# Patient Record
Sex: Female | Born: 1945
Health system: Southern US, Community
[De-identification: ages and names within clinical notes are randomized; demographics above are authoritative.]

## PROBLEM LIST (undated history)

## (undated) DIAGNOSIS — K219 Gastro-esophageal reflux disease without esophagitis: Secondary | ICD-10-CM

## (undated) DIAGNOSIS — Z889 Allergy status to unspecified drugs, medicaments and biological substances status: Secondary | ICD-10-CM

## (undated) DIAGNOSIS — F329 Major depressive disorder, single episode, unspecified: Secondary | ICD-10-CM

## (undated) DIAGNOSIS — K635 Polyp of colon: Secondary | ICD-10-CM

## (undated) DIAGNOSIS — M1990 Unspecified osteoarthritis, unspecified site: Secondary | ICD-10-CM

## (undated) DIAGNOSIS — R011 Cardiac murmur, unspecified: Secondary | ICD-10-CM

## (undated) DIAGNOSIS — M48061 Spinal stenosis, lumbar region without neurogenic claudication: Secondary | ICD-10-CM

## (undated) DIAGNOSIS — F32A Depression, unspecified: Secondary | ICD-10-CM

## (undated) DIAGNOSIS — E785 Hyperlipidemia, unspecified: Secondary | ICD-10-CM

## (undated) DIAGNOSIS — I10 Essential (primary) hypertension: Secondary | ICD-10-CM

## (undated) DIAGNOSIS — T7840XA Allergy, unspecified, initial encounter: Secondary | ICD-10-CM

## (undated) HISTORY — DX: Spinal stenosis, lumbar region without neurogenic claudication: M48.061

## (undated) HISTORY — PX: CHOLECYSTECTOMY: SHX55

## (undated) HISTORY — DX: Hyperlipidemia, unspecified: E78.5

## (undated) HISTORY — DX: Depression, unspecified: F32.A

## (undated) HISTORY — DX: Polyp of colon: K63.5

## (undated) HISTORY — DX: Allergy, unspecified, initial encounter: T78.40XA

## (undated) HISTORY — DX: Cardiac murmur, unspecified: R01.1

## (undated) HISTORY — PX: APPENDECTOMY: SHX54

## (undated) HISTORY — DX: Gastro-esophageal reflux disease without esophagitis: K21.9

## (undated) HISTORY — PX: COLONOSCOPY: SHX174

## (undated) HISTORY — PX: ABDOMINAL HYSTERECTOMY: SHX81

## (undated) HISTORY — PX: POLYPECTOMY: SHX149

## (undated) HISTORY — PX: BUNIONECTOMY: SHX129

---
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
## 1997-08-02 ENCOUNTER — Other Ambulatory Visit: Admission: RE | Admit: 1997-08-02 | Discharge: 1997-08-02 | Payer: Self-pay | Admitting: Obstetrics and Gynecology

## 1997-09-01 ENCOUNTER — Ambulatory Visit (HOSPITAL_COMMUNITY): Admission: RE | Admit: 1997-09-01 | Discharge: 1997-09-01 | Payer: Self-pay | Admitting: Obstetrics and Gynecology

## 1998-09-19 ENCOUNTER — Encounter: Payer: Self-pay | Admitting: Obstetrics and Gynecology

## 1998-09-19 ENCOUNTER — Other Ambulatory Visit: Admission: RE | Admit: 1998-09-19 | Discharge: 1998-09-19 | Payer: Self-pay | Admitting: Obstetrics and Gynecology

## 1998-09-19 ENCOUNTER — Ambulatory Visit (HOSPITAL_COMMUNITY): Admission: RE | Admit: 1998-09-19 | Discharge: 1998-09-19 | Payer: Self-pay | Admitting: Obstetrics and Gynecology

## 1999-02-19 ENCOUNTER — Other Ambulatory Visit: Admission: RE | Admit: 1999-02-19 | Discharge: 1999-02-19 | Payer: Self-pay | Admitting: Podiatry

## 1999-08-16 ENCOUNTER — Encounter: Admission: RE | Admit: 1999-08-16 | Discharge: 1999-08-16 | Payer: Self-pay | Admitting: Internal Medicine

## 1999-08-16 ENCOUNTER — Encounter: Payer: Self-pay | Admitting: Internal Medicine

## 1999-09-26 ENCOUNTER — Encounter: Payer: Self-pay | Admitting: Obstetrics and Gynecology

## 1999-09-26 ENCOUNTER — Ambulatory Visit (HOSPITAL_COMMUNITY): Admission: RE | Admit: 1999-09-26 | Discharge: 1999-09-26 | Payer: Self-pay | Admitting: Obstetrics and Gynecology

## 1999-09-26 ENCOUNTER — Other Ambulatory Visit: Admission: RE | Admit: 1999-09-26 | Discharge: 1999-09-26 | Payer: Self-pay | Admitting: Obstetrics and Gynecology

## 1999-11-15 ENCOUNTER — Ambulatory Visit (HOSPITAL_COMMUNITY): Admission: RE | Admit: 1999-11-15 | Discharge: 1999-11-15 | Payer: Self-pay | Admitting: *Deleted

## 2000-10-08 ENCOUNTER — Encounter: Payer: Self-pay | Admitting: Obstetrics and Gynecology

## 2000-10-08 ENCOUNTER — Ambulatory Visit (HOSPITAL_COMMUNITY): Admission: RE | Admit: 2000-10-08 | Discharge: 2000-10-08 | Payer: Self-pay | Admitting: Obstetrics and Gynecology

## 2000-11-23 ENCOUNTER — Other Ambulatory Visit: Admission: RE | Admit: 2000-11-23 | Discharge: 2000-11-23 | Payer: Self-pay | Admitting: Obstetrics and Gynecology

## 2000-12-03 ENCOUNTER — Ambulatory Visit (HOSPITAL_COMMUNITY): Admission: RE | Admit: 2000-12-03 | Discharge: 2000-12-03 | Payer: Self-pay | Admitting: Obstetrics and Gynecology

## 2000-12-03 ENCOUNTER — Encounter: Payer: Self-pay | Admitting: Obstetrics and Gynecology

## 2001-09-29 ENCOUNTER — Other Ambulatory Visit: Admission: RE | Admit: 2001-09-29 | Discharge: 2001-09-29 | Payer: Self-pay | Admitting: Obstetrics and Gynecology

## 2002-09-23 ENCOUNTER — Encounter: Payer: Self-pay | Admitting: Obstetrics and Gynecology

## 2002-09-23 ENCOUNTER — Ambulatory Visit (HOSPITAL_COMMUNITY): Admission: RE | Admit: 2002-09-23 | Discharge: 2002-09-23 | Payer: Self-pay | Admitting: Obstetrics and Gynecology

## 2002-09-28 ENCOUNTER — Other Ambulatory Visit: Admission: RE | Admit: 2002-09-28 | Discharge: 2002-09-28 | Payer: Self-pay | Admitting: Obstetrics and Gynecology

## 2002-12-08 ENCOUNTER — Ambulatory Visit (HOSPITAL_COMMUNITY): Admission: RE | Admit: 2002-12-08 | Discharge: 2002-12-08 | Payer: Self-pay | Admitting: Obstetrics and Gynecology

## 2002-12-13 ENCOUNTER — Encounter (INDEPENDENT_AMBULATORY_CARE_PROVIDER_SITE_OTHER): Payer: Self-pay | Admitting: Specialist

## 2002-12-13 ENCOUNTER — Inpatient Hospital Stay (HOSPITAL_COMMUNITY): Admission: AD | Admit: 2002-12-13 | Discharge: 2002-12-16 | Payer: Self-pay | Admitting: Obstetrics and Gynecology

## 2002-12-13 ENCOUNTER — Encounter (INDEPENDENT_AMBULATORY_CARE_PROVIDER_SITE_OTHER): Payer: Self-pay

## 2003-08-04 ENCOUNTER — Encounter (INDEPENDENT_AMBULATORY_CARE_PROVIDER_SITE_OTHER): Payer: Self-pay | Admitting: Specialist

## 2003-08-04 ENCOUNTER — Ambulatory Visit (HOSPITAL_COMMUNITY): Admission: RE | Admit: 2003-08-04 | Discharge: 2003-08-04 | Payer: Self-pay | Admitting: Gastroenterology

## 2003-10-12 ENCOUNTER — Ambulatory Visit (HOSPITAL_COMMUNITY): Admission: RE | Admit: 2003-10-12 | Discharge: 2003-10-12 | Payer: Self-pay | Admitting: Obstetrics and Gynecology

## 2003-11-23 ENCOUNTER — Other Ambulatory Visit: Admission: RE | Admit: 2003-11-23 | Discharge: 2003-11-23 | Payer: Self-pay | Admitting: Obstetrics and Gynecology

## 2004-10-23 ENCOUNTER — Ambulatory Visit (HOSPITAL_COMMUNITY): Admission: RE | Admit: 2004-10-23 | Discharge: 2004-10-23 | Payer: Self-pay | Admitting: Obstetrics and Gynecology

## 2004-11-29 ENCOUNTER — Other Ambulatory Visit: Admission: RE | Admit: 2004-11-29 | Discharge: 2004-11-29 | Payer: Self-pay | Admitting: Obstetrics and Gynecology

## 2005-08-26 ENCOUNTER — Other Ambulatory Visit: Admission: RE | Admit: 2005-08-26 | Discharge: 2005-08-26 | Payer: Self-pay | Admitting: Obstetrics and Gynecology

## 2005-10-24 ENCOUNTER — Ambulatory Visit (HOSPITAL_COMMUNITY): Admission: RE | Admit: 2005-10-24 | Discharge: 2005-10-24 | Payer: Self-pay | Admitting: Obstetrics and Gynecology

## 2006-01-27 HISTORY — PX: OOPHORECTOMY: SHX86

## 2006-06-09 ENCOUNTER — Ambulatory Visit: Payer: Self-pay | Admitting: Nurse Practitioner

## 2007-01-18 ENCOUNTER — Ambulatory Visit: Payer: Self-pay | Admitting: Internal Medicine

## 2007-01-18 ENCOUNTER — Ambulatory Visit: Payer: Self-pay | Admitting: *Deleted

## 2007-01-19 ENCOUNTER — Ambulatory Visit (HOSPITAL_COMMUNITY): Admission: RE | Admit: 2007-01-19 | Discharge: 2007-01-19 | Payer: Self-pay | Admitting: Internal Medicine

## 2007-02-09 ENCOUNTER — Ambulatory Visit: Payer: Self-pay | Admitting: Internal Medicine

## 2007-04-07 ENCOUNTER — Ambulatory Visit: Payer: Self-pay | Admitting: Family Medicine

## 2007-04-13 ENCOUNTER — Ambulatory Visit (HOSPITAL_COMMUNITY): Admission: RE | Admit: 2007-04-13 | Discharge: 2007-04-13 | Payer: Self-pay | Admitting: Family Medicine

## 2007-11-15 ENCOUNTER — Ambulatory Visit: Payer: Self-pay | Admitting: Internal Medicine

## 2008-03-17 ENCOUNTER — Ambulatory Visit: Payer: Self-pay | Admitting: Internal Medicine

## 2008-03-17 ENCOUNTER — Encounter (INDEPENDENT_AMBULATORY_CARE_PROVIDER_SITE_OTHER): Payer: Self-pay | Admitting: Internal Medicine

## 2008-03-17 LAB — CONVERTED CEMR LAB
AST: 16 units/L (ref 0–37)
Alkaline Phosphatase: 48 units/L (ref 39–117)
BUN: 21 mg/dL (ref 6–23)
Calcium: 9.7 mg/dL (ref 8.4–10.5)
Chloride: 103 meq/L (ref 96–112)
Creatinine, Ser: 0.77 mg/dL (ref 0.40–1.20)
Total Bilirubin: 0.5 mg/dL (ref 0.3–1.2)

## 2008-04-04 ENCOUNTER — Encounter: Admission: RE | Admit: 2008-04-04 | Discharge: 2008-05-23 | Payer: Self-pay | Admitting: Internal Medicine

## 2008-04-18 ENCOUNTER — Ambulatory Visit (HOSPITAL_COMMUNITY): Admission: RE | Admit: 2008-04-18 | Discharge: 2008-04-18 | Payer: Self-pay | Admitting: Family Medicine

## 2008-07-19 ENCOUNTER — Ambulatory Visit: Payer: Self-pay | Admitting: Internal Medicine

## 2008-07-25 ENCOUNTER — Ambulatory Visit (HOSPITAL_COMMUNITY): Admission: RE | Admit: 2008-07-25 | Discharge: 2008-07-25 | Payer: Self-pay | Admitting: Internal Medicine

## 2008-10-16 ENCOUNTER — Ambulatory Visit: Payer: Self-pay | Admitting: Family Medicine

## 2008-10-25 ENCOUNTER — Ambulatory Visit (HOSPITAL_COMMUNITY): Admission: RE | Admit: 2008-10-25 | Discharge: 2008-10-25 | Payer: Self-pay | Admitting: Gastroenterology

## 2008-10-25 ENCOUNTER — Encounter (INDEPENDENT_AMBULATORY_CARE_PROVIDER_SITE_OTHER): Payer: Self-pay | Admitting: Gastroenterology

## 2009-01-02 ENCOUNTER — Ambulatory Visit: Payer: Self-pay | Admitting: Internal Medicine

## 2009-01-23 ENCOUNTER — Ambulatory Visit: Payer: Self-pay | Admitting: Internal Medicine

## 2009-04-26 ENCOUNTER — Ambulatory Visit (HOSPITAL_COMMUNITY): Admission: RE | Admit: 2009-04-26 | Discharge: 2009-04-26 | Payer: Self-pay | Admitting: Internal Medicine

## 2010-02-16 ENCOUNTER — Encounter: Payer: Self-pay | Admitting: Family Medicine

## 2010-06-14 NOTE — Op Note (Signed)
NAME:  [name], [name]                           ACCOUNT NO.:  [id number]   MEDICAL RECORD NO.:  [id number]                   PATIENT TYPE:  AMB   LOCATION:  ENDO                                 FACILITY:  [facility]   PHYSICIAN:  Graylin Shiver, M.D.                DATE OF BIRTH:  09/03/1945   DATE OF PROCEDURE:  08/04/2003  DATE OF DISCHARGE:                                 OPERATIVE REPORT   PROCEDURE PERFORMED:  Colonoscopy with biopsy.   INDICATIONS FOR PROCEDURE:  Screening.   Informed consent was obtained after explanation of the risks of bleeding,  infection, and perforation.   PREMEDICATIONS:  Fentanyl 60 mcg  IV, Versed 6 mg IV.   DESCRIPTION OF PROCEDURE:  With the patient in the left lateral decubitus  position, a rectal exam was performed and no masses were felt.  The Olympus  colonoscope was inserted into the rectum and advanced around the colon to  the cecum.  Cecal landmarks were identified.  The cecum showed a small 3 mm  polyp biopsied off with cold forceps.  The ascending colon was normal.  The  transverse colon showed a small 3 mm polyp biopsied off with a cold forceps.  The descending colon and sigmoid revealed diverticulosis.  Also in the  sigmoid was a small 3 mm polyp biopsied off with cold forceps.  The rectum  was normal.  The patient tolerated the procedure well without complications.   IMPRESSION:  1. Colon polyps, 2113.  2. Diverticulosis.   PLAN:  The pathology will be checked.                                               Graylin Shiver, M.D.    Germain Osgood  D:  08/04/2003  T:  08/04/2003  Job:  [id number]   cc:   Merlene Laughter. Renae Gloss, M.D.  [address]  [address]  [place]  [place] [address]  Fax: [phone]

## 2010-06-14 NOTE — Discharge Summary (Signed)
[name], [name]                           ACCOUNT NO.:  [id number]   MEDICAL RECORD NO.:  [id number]                   PATIENT TYPE:  INP   LOCATION:  0480                                 FACILITY:  [facility]   PHYSICIAN:  Naima A. Dillard, M.D.              DATE OF BIRTH:  Jun 21, 1945   DATE OF ADMISSION:  12/13/2002  DATE OF DISCHARGE:  12/16/2002                                 DISCHARGE SUMMARY   ADMISSION DIAGNOSES:  1. Left adnexal mass.  2. Pelvic pain.  3. Right ovarian cyst.   DISCHARGE DIAGNOSES:  1. Left adnexal mass.  2. Pelvic pain.  3. Right ovarian cyst, postoperative day #3 status post open laparoscopy,     exploratory laparotomy, pelvic washing, lysis of adhesions, left salpingo-     oophorectomy, right ovarian biopsy and cystectomy.   DISCHARGE MEDICATIONS:  Discharge medications include her blood pressure  medications which are T____ and Cardizem, also K-Dur, Phenergan, Motrin, and  Percocet.   DISCHARGE INSTRUCTIONS:  Patient is to remain on pelvic rest for 6 weeks, no  driving for 2 weeks.  She is to keep the incision clean and dry and call for  any redness, heavy vaginal bleeding, heavy bleeding from incision, exudate  from the incision.  She is also to call for any temperature greater than  100, severe abdominal pain, or unable to pass gas or have bowel movements.  She is to return to the office on December 19, 2002 at 9:45 for staple  removal.   HOSPITAL COURSE:  Patient underwent an open laparoscopy, pelvic washing,  exploratory laparotomy, lysis of adhesions, left salpingo-oophorectomy,  right ovarian biopsy and cystectomy on December 13, 2002.  Throughout the  hospital stay the patient had somewhat slow bowel recovery and did not pass  gas until late in the evening postop day #2.  Her heart remained regular.  She did have some crackles in her bases of her lungs.  Abdomen soft and  nontender.  No vaginal bleeding.  Also patient did have full bowel  recovery  and tolerated a regular diet.  She also had hypokalemia.  Her potassium was  replaced IV and p.o., her IV infiltrated so we decided to replace with p.o.  and it went from 2.9 to 3.3 and potassium is still being supplemented for 2  more weeks and she will follow up with her primary care doctor for that.  As  far as her wound it remained clean and dry and patient will come to the  office on December 19, 2002 for staple removal.  She also had a slight  temperature, T-max during her hospital stay was 100.4, it was only on one  occasion.  Her white count did decrease from 13 to 10.  She had no left  shift.  She had no signs of infection.  I believe that the temperature  elevation was secondary to atelectasis.  Important labs postoperatively BUN  and creatinine are 6 and 0.8 and potassium was 2.9, preop hemoglobin was  13.7, postop hemoglobin was 12.9 and platelets of 232, after potassium  replacement patient's potassium went to 3.3 from 2.7, her hemoglobin  remained stable at 12.3 and platelets of 237, white count actually decreased  to 10.6.  Patient was found to deem all the benefits from her hospital stay  and will be discharged home with instructions as above.                                              Naima A. Normand Sloop, M.D.   NAD/MEDQ  D:  12/16/2002  T:  12/16/2002  Job:  [id number]

## 2010-06-14 NOTE — H&P (Signed)
[name]                             ACCOUNT NO.:  [id number]   MEDICAL RECORD NO.:  [id number]                   PATIENT TYPE:   LOCATION:                                       FACILITY:   PHYSICIAN:  Naima A. Dillard, M.D.              DATE OF BIRTH:  1945/09/25   DATE OF ADMISSION:  DATE OF DISCHARGE:                                HISTORY & PHYSICAL   HISTORY OF PRESENT ILLNESS:  [name] is a 65 year old married African  American female who is status post hysterectomy and right salpingectomy, who  is para 3-0-0-3, who presents for a diagnostic laparoscopy because of pelvic  pain and a left pelvic mass.  The patient has a history of an asymptomatic  left hydrosalpinx since 1999 but developed intermittent pain in her pelvis  over the past two months.  The patient describes this pain as achy in nature  and is rated as a 5/10 on a 10-point scale without any alleviating or  exacerbating factors.  She denies any urinary tract symptoms, nausea,  vomiting, diarrhea, fever, changes in her bowel movement, or dyspareunia.  A  pelvic ultrasound on October 14, 2002 revealed a 3.5 cm by 1.49 cm left  hydrosalpinx which was an increase in size since her previous study February  2003 when it measured less than 1 cm (only one dimension was available from  that study).   A pelvic MRI on December 08, 2002 showed a 1.5 cm right ovarian cyst and on  the left a slightly serpentine cystic lesion, previously characterized as a  left hydrosalpinx measuring 1.9 cm by 2.2 cm by 2.2 cm.  There was no  complexity of the fluid signal structure within this lesion.  Additionally,  there was no enhancing component within the lesion suggesting a neoplastic  process.  Lastly, sigmoid diverticulosis was noted; however, there was no  evidence of active diverticulitis.   At the time of this dictation, CA125 results were pending.  After review of  the patient's study findings and symptoms, she was given  the options of  observation, surgery, and patient has opted to proceed with surgery.   PAST MEDICAL HISTORY:  Obstetrical history is gravida 5, para 3-0-2-3.  The  patient had no problems with her pregnancies.  GYN history:  Menarche 65  years old.  The patient's menstrual periods were more regular up until the  time of her hysterectomy.  She denies any history of abnormal Pap smears or  sexually transmitted diseases.  Her last normal Pap smear was September  2004.  Last normal mammogram was August 2004.  Medical history is positive  for hypertension and diverticulosis.   PAST SURGICAL HISTORY:  1. In 1974, cholecystectomy.  2. In 1980, total vaginal hysterectomy.  3. In 1996, laparoscopy with right salpingectomy for hydrosalpinx and lysis     of adhesions.  4. In 2001, bilateral bunionectomy.  The patient denies any history of blood     transfusions or problems with anesthesia.   FAMILY HISTORY:  Positive for heart disease, colon cancer, prostate cancer,  and hypertension.   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   HABITS:  She does not use tobacco or alcohol.   CURRENT MEDICATIONS:  1. Teveten 600/12.5 mg 1 tablet q.d.  2. Cardizem LA 360 mg 1 tablet q.d.  3. Celebrex 200 mg 1 tablet p.r.n.  4. Aleve 1 tablet q.d.   ALLERGIES:  the patient has no known drug allergies.   REVIEW OF SYMPTOMS:  The patient complains of occasional knee pain and back  pain.  Otherwise review of systems are negative except as mentioned in  history of present illness.   PHYSICAL EXAMINATION:  VITAL SIGNS:  Blood pressure 140/90.  Weight is 241.  Height is 5 feet 4 inches tall.  NECK:  Supple.  There is no adenopathy or thyromegaly.  HEART:  Regular rate and rhythm. There is no murmur.  LUNGS:  Clear to auscultation.  There are no wheezes, rales, or rhonchi.  BACK:  No CVA tenderness.  ABDOMEN:  Bowel sounds are present.  It is soft  without tenderness,  guarding, rebound, or organomegaly.  EXTREMITIES:  Without clubbing, cyanosis, or edema.  PELVIC:  EGBUS is within normal limits.  Vagina is normal.  Uterus and  cervix are surgically absent.  Adnexa with bilateral tenderness.  No  palpable masses.  Rectovaginal exam without tenderness or masses.   IMPRESSION:  1. Left pelvic mass.  2. Pelvic pain.   DISPOSITION:  The patient was given the options of undergoing surgery or  observation in relationship to her presenting symptoms and radiographic  findings.  She has chosen the former.  She understands the implications for  her procedure along with its risk which include, but are not limited to,  reaction to anesthesia, damage to adjacent organs, excessive bleeding, and  infection.  The patient was given ACOG brochure on laparoscopy.  She also  was advised to take preoperatively erythromycin 1 gm and Neomycin 1 gm 2  p.m., 4 p.m., and 10 p.m. one day prior to her surgery.  The patient was  also instructed to perform a bowel prep and given written instructions on  how to do so.  The patient has consented to undergo a diagnostic laparoscopy  with possible laparotomy and possible cancer staging at [facility] on December 13, 2002 at 2 p.m.     [name].                    Naima A. Normand Sloop, M.D.    EJP/MEDQ  D:  12/12/2002  T:  12/12/2002  Job:  [id number]

## 2010-06-14 NOTE — Op Note (Signed)
[name], [name]                           ACCOUNT NO.:  [id number]   MEDICAL RECORD NO.:  [id number]                   PATIENT TYPE:  OUT   LOCATION:  XRAY                                 FACILITY:  [facility]   PHYSICIAN:  Naima A. Dillard, M.D.              DATE OF BIRTH:  [birth year]   DATE OF PROCEDURE:  12/13/2002  DATE OF DISCHARGE:  12/08/2002                                 OPERATIVE REPORT   PREOPERATIVE DIAGNOSES:  1. Left adnexal mass.  2. Pelvic pain.  3. Right ovarian cyst.   POSTOPERATIVE DIAGNOSES:  1. Right corpus luteum cyst.  2. Left simple paratubal cyst.  3. Normal appearing left tube and ovary.  4. Pelvic adhesions.   PROCEDURE:  Open laparoscopy, pelvic washings, exploratory laparotomy, lysis  of adhesions, left salpingo-oophorectomy, right ovarian biopsy and  cystectomy.   SURGEON:  Naima A. Normand Sloop, M.D.   ASSISTANT:  Osborn Coho, M.D. and Marquis Lunch. Adline Peals.   ANESTHESIA:  General endotracheal tube.   ESTIMATED BLOOD LOSS:  300 mL   URINE OUTPUT:  900 mL clear urine.   IV FLUIDS:  4 liters crystalloid.   COMPLICATIONS:  None.   FINDINGS:  Pelvic adhesions, sigmoid vaginal cuff adhesions, sigmoid left  pelvic sidewall adhesions. There was a 3 cm paratubal cyst on the left,  there is a normal appearing left tube and ovary, the right ovarian corpus  luteal cyst and very atretic right ovary. Normal appearing appendix. The  patient went to the recovery room in stable condition.   DESCRIPTION OF PROCEDURE:  The patient was taken to the operating room where  she was placed in dorsal lithotomy position, given general anesthesia and  prepped and draped in the normal sterile fashion. A Foley catheter was  placed, 5 mL of 0.5% Marcaine with epinephrine was placed just beneath the  umbilicus. A 10 mm incision was then made with the scalpel and carried down  to the fascia. The fascia was incised in the midline and extended  bilaterally. The  peritoneum was identified, and entered sharply. A Hasson  trocar was then placed, insufflation of the abdomen was done with CO2 gas to  about 4 liters. The findings noted above were seen. A port was placed in the  left lower quadrant with a 5 mm trocar and there was some bleeding seen  around the trocar with placement which did come to a halt. Peritoneal  washings were obtained after probing noted to be dense adhesions. Dr. Kemper Durie-  Sharol Given was called in for assistance; however, he felt like he was busy and  unable to help at that time but felt that the patient needed to be open. At  this time, we did make the decision to do a laparotomy secondary to dense  adhesions and unable to get to see either ovary or the mass so the ports  were removed. The left lower abdominal  incision port was noted to be  hemostatic. The umbilical port was removed and a vertical skin incision was  then made with the scalpel along her previous incision probably from her  hysterectomy and carried down to the fascia. The fascia was then incised and  extended superiorly and inferiorly with good visualization of bowel and  bladder. The bowel was packed away with moist laparotomy sponges after the  Balfour retractor was placed. Attention was then turned to the patient's  right ovary and the right infundibulopelvic ligament was able to be  identified; however, there were severe adhesions. The sigmoid and the right  side of the colon was dissected off the right side of the pelvic wall. The  remnant of the right round ligament was identified, grasped and the  posterior aspect of the broad ligament was incised and retroperitoneally the  ureter was found. At this point, Dr. Su Hilt was called in to help secondary  to dense adhesions. I then went to the patient left side, removed more  sigmoid adhesions and the ovary and tube was found to be adherent to the  left pelvic sidewall. Again I picked up the remnant of the round  ligament on  the left and entered retroperitoneally using the broad ligament. The ureter  was then identified and at this time after adhesions were dissected, the  infundibulopelvic ligament was doubly clamped and cut, ligated with a free  tie and then suture ligated and hemostasis was assured. The paratubal cyst  was then bluntly and sharply dissected with Metzenbaum scissors and sent for  frozen pathology, this was a benign paratubal cyst. Hemostasis was assured,  the ureter was found to be fine. On the patient's right side was a very  atretic ovary there, a cystectomy was done and frozen noted to be a corpus  luteal cyst. What was left of the ovary was removed, the ureter was noted  and noted to be peristaltic, no damage was done to the ureter. The patient's  omentum was normal, appendix was normal appearing. The abdomen was then  irrigated copiously with saline, hemostasis was assured. There was no  bleeding coming from that port on the left lower abdomen. The fascia was  closed with #1 PDS in a running fashion from above and below meeting in the  middle. Hemostasis was assured before the fascia was closed we looked at the  rectus muscle and there was no bleeding or any bleeding was made hemostatic  with Bovie cautery. Before the fascia was closed on the vertical incision,  the fascia from the open laparoscopy was closed. A drain was placed into the  subcutaneous tissue after it was irrigated and the area was noted to be  hemostatic. 2-0 plain was then used to reapproximate the subcutaneous  tissue, the skin was closed with staples. The open laparoscopy was closed  with a subcuticular stitch. Sponge, lap and needle counts were correct x2.  The patient went to the recovery room in stable condition.                                               Naima A. Normand Sloop, M.D.    NAD/MEDQ  D:  12/13/2002  T:  12/13/2002  Job:  [id number]

## 2010-06-14 NOTE — H&P (Signed)
NAMEEUGENIA, [name]                           ACCOUNT NO.:  [id number]   MEDICAL RECORD NO.:  [id number]                   PATIENT TYPE:  AMB   LOCATION:  DAY                                  FACILITY:  [facility]   PHYSICIAN:  Naima A. Dillard, M.D.              DATE OF BIRTH:  1945-12-11   DATE OF ADMISSION:  DATE OF DISCHARGE:                                HISTORY & PHYSICAL   CHIEF COMPLAINT:  Left adnexal mass increasing in size.   HISTORY OF PRESENT ILLNESS:  The patient is a 65 year old African-American  female whom I met 2002.  The patient came to me with complaints of  intermittent pelvic pain and menopausal symptoms in 2002.  Her pelvic pain  has been intermittent and her total workup for the most part has been  negative, except for a left hydrosalpinx, which has been present 1999.  Because of the small risk  adnexal mass which we think is a hydrosalpinx,  which has been present since 1999 and there is a small risk of cancer the  patient chose to have observation.  The patient presented for another  ultrasound on October 14, 2002, which showed that the left hydrosalpinx  increased size measuring 3 x 1.4 x 2 cm in 2003.  The patient still was  having some intermittent pelvic pain and decided to proceed with removal of  the mass, and possible oophorectomy.  The patient also had an MRI of the  pelvis on December 08, 2002, which showed a 1.9 x 2.2 x 2.2 cystic mass  component, which is consistent with a left hydrosalpinx.  The left ovary  measured 1.3 x 3.2 x 3 cm ad the right ovary measured 1.2 x 2.9 x 2.0 cm  with a 1.5 cm cyst.  MRI was also significant for diverticulosis and a tiny  amount of pelvic free fluid, and some thickening on the vaginal mucosa at  the cuff.  The patient denies having any vaginal discharge, odor, fever, any  bleeding, dyspareunia, any urinary tract infection symptoms, or a history of  kidney stones,  she states that she has been constipated  occasionally.  She  does not have any rectal bleeding, nausea or vomiting.  She denies any  history of endometriosis or fibroids even though her hysterectomy was done  for The uterus was twisted.  She is not sure if she had fibroids at the  time.  She has been followed by previous ultrasound.  She denies having any  history of sexually transmitted disease; but, she has had a cholecystectomy  in the past.   PAST MEDICAL HISTORY:  The past medical history is significant for  hypertension.   MEDICATIONS:  The patient's medications include:  1. Cardizem 360 mg daily.  2. Aleve daily.  3. Celebrex 200 mg p.r.n.  4. Teveten 600/12.5 mg daily.   PAST SURGICAL HISTORY:  The past surgical  history is significant for:  1. Cholecystectomy in 1974.  2. In 1988 she had a hysterectomy.  3. In 1996 she had a laparoscopic right salpingectomy with lysis of     adhesions.  4. In 2001 she had a bunionectomy.   ALLERGIES:  The patient has no known drug allergies.   SOCIAL HISTORY:  Social history is negative for alcohol, cigarettes or  illicit drug use.   FAMILY HISTORY:  Family history is significant for a paternal aunt with  breast cancer and father with hypertension.   REVIEW OF SYSTEMS:  On review of systems the patient has knee and back pain;  and, the rest is as above.   PHYSICAL EXAMINATION:  VITAL SIGNS:  On physical exam she weighs 241 pounds.  Her blood pressure is 140/90.  HEENT:  The patient's pupils are equal.  Hearing is normal.  Throat is  clear.  NECK:  The patient's thyroid is not enlarged.  HEART:  Heart has regular rate and rhythm.  LUNGS:  The patient's lungs are clear to auscultation bilaterally.  BACK:  The back has no CVA tenderness.  ABDOMEN:  The patient's abdomen is nontender without any masses or  organomegaly.  BREASTS:  Breasts have no masses, discharges, skin changes or nipple  retraction.  EXTREMITIES:  There are no cyanosis, clubbing or edema.  NEUROLOGIC:   Neuro exam is within normal limits.  PELVIC EXAMINATION:  Vulvovaginal exam is within normal limits.  Her adnexa  have no masses and are nontender on palpation.  Rectovaginal exam is within  normal limits.   ASSESSMENT:  Left adnexal mass with possible growth.   The patient understands that this could be just an increase in the  hydrosalpinx, it could be the same in measurement error.  It could represent  a cancer of the ovarian or fallopian tube.   The patient was given the option of observation with another ultrasound  versus laparoscopy, possible exploratory laparotomy and possible staging.  Because of the patient's pelvic pain she opted to have the laparoscopy with  removal of the mass, possible bilateral oophorectomy and possible staging at  laparotomy.  The patient understands the risks are, but not limited to  bleeding, infection, damage to internal organs such as bowel, bladder and  major blood vessels.  The patient did have a bowel prep with antibiotics and  was clear how to take them.  She came in for a preoperative visit with  physician assistant, Marquis Lunch. Lowell Guitar, on December 13, 2002.                                               Naima A. Normand Sloop, M.D.    NAD/MEDQ  D:  12/12/2002  T:  12/13/2002  Job:  [id number]

## 2010-11-29 ENCOUNTER — Other Ambulatory Visit: Payer: Self-pay | Admitting: Family Medicine

## 2010-12-31 ENCOUNTER — Other Ambulatory Visit: Payer: Self-pay | Admitting: Family Medicine

## 2010-12-31 DIAGNOSIS — Z1231 Encounter for screening mammogram for malignant neoplasm of breast: Secondary | ICD-10-CM

## 2011-02-05 ENCOUNTER — Ambulatory Visit (HOSPITAL_COMMUNITY)
Admission: RE | Admit: 2011-02-05 | Discharge: 2011-02-05 | Disposition: A | Discharge status: Home / Self Care | Payer: Self-pay | Source: Ambulatory Visit | Attending: Family Medicine | Admitting: Family Medicine

## 2011-02-05 DIAGNOSIS — Z1231 Encounter for screening mammogram for malignant neoplasm of breast: Secondary | ICD-10-CM

## 2011-10-23 ENCOUNTER — Emergency Department (INDEPENDENT_AMBULATORY_CARE_PROVIDER_SITE_OTHER): Payer: Self-pay

## 2011-10-23 ENCOUNTER — Emergency Department (INDEPENDENT_AMBULATORY_CARE_PROVIDER_SITE_OTHER)
Admission: EM | Admit: 2011-10-23 | Discharge: 2011-10-23 | Disposition: A | Discharge status: Home / Self Care | Payer: Self-pay | Source: Home / Self Care | Attending: Emergency Medicine | Admitting: Emergency Medicine

## 2011-10-23 ENCOUNTER — Encounter (HOSPITAL_COMMUNITY): Payer: Self-pay

## 2011-10-23 DIAGNOSIS — M25461 Effusion, right knee: Secondary | ICD-10-CM

## 2011-10-23 DIAGNOSIS — M25469 Effusion, unspecified knee: Secondary | ICD-10-CM

## 2011-10-23 DIAGNOSIS — M1710 Unilateral primary osteoarthritis, unspecified knee: Secondary | ICD-10-CM

## 2011-10-23 DIAGNOSIS — M179 Osteoarthritis of knee, unspecified: Secondary | ICD-10-CM

## 2011-10-23 DIAGNOSIS — IMO0002 Reserved for concepts with insufficient information to code with codable children: Secondary | ICD-10-CM

## 2011-10-23 HISTORY — DX: Essential (primary) hypertension: I10

## 2011-10-23 MED ORDER — MELOXICAM 7.5 MG PO TABS: 7.5000 mg | ORAL_TABLET | Freq: Every day | ORAL | Status: AC

## 2011-10-23 MED ORDER — HYDROCODONE-ACETAMINOPHEN 5-500 MG PO TABS: 1.0000 | ORAL_TABLET | Freq: Four times a day (QID) | ORAL | Status: DC | PRN

## 2011-10-23 NOTE — ED Provider Notes (Signed)
History     CSN: [id number]  Arrival date & time 10/23/11  1414   First MD Initiated Contact with Patient 10/23/11 1434      Chief Complaint  Patient presents with  . Knee Pain    (Consider location/radiation/quality/duration/timing/severity/associated sxs/prior treatment) HPI Comments: Patient presents urgent care complaining of right knee pain (global), denies any recent injury falls or traumas. She describes she has known arthritis on her left knee but at this point that seem to be doing better lately. This pain started about a week ago and occasionally feels like it hurts on top of her knee sometimes towards the inner aspect of it. Hurts more in the mornings and as the day progresses tends to get a bit better. Denies any increase swelling, redness or warmth. Patient also denies any weakness or tingling or numbness sensation to her right lower extremity. Movement and activity and walking exacerbates her pain and she's been use of a local cream was recommended to her that seem to have helped in the last few days.  Patient is a 66 y.o. female presenting with knee pain. The history is provided by the patient.  Knee Pain This is a new problem. The problem occurs constantly. The problem has been gradually worsening. Pertinent negatives include no chest pain. The symptoms are aggravated by walking and twisting. Nothing relieves the symptoms. The treatment provided no relief.    Past Medical History  Diagnosis Date  . Hypertension     History reviewed. No pertinent past surgical history.  No family history on file.  History  Substance Use Topics  . Smoking status: Never Smoker   . Smokeless tobacco: Not on file  . Alcohol Use: No    OB History    Grav Para Term Preterm Abortions TAB SAB Ect Mult Living                  Review of Systems  Constitutional: Positive for activity change. Negative for fever, chills, diaphoresis, appetite change and fatigue.  Cardiovascular:  Negative for chest pain and leg swelling.  Musculoskeletal: Positive for joint swelling. Negative for myalgias and arthralgias.  Skin: Negative for color change, rash and wound.  Neurological: Negative for tremors, weakness and numbness.    Allergies  Review of patient's allergies indicates no known allergies.  Home Medications   Current Outpatient Rx  Name Route Sig Dispense Refill  . DILTIAZEM HCL 120 MG PO TABS Oral Take 120 mg by mouth 3 (three) times daily.    . GUAIFENESIN ER 600 MG PO TB12 Oral Take 1,200 mg by mouth 2 (two) times daily.    Marland Kitchen HYDROCHLOROTHIAZIDE 25 MG PO TABS Oral Take 25 mg by mouth daily.    Marland Kitchen LISINOPRIL 20 MG PO TABS Oral Take 20 mg by mouth 2 (two) times daily.    Marland Kitchen NAPROXEN 500 MG PO TABS Oral Take 500 mg by mouth as needed.    Marland Kitchen VITAMIN B-12 1000 MCG PO TABS Oral Take 1,000 mcg by mouth daily.      BP 170/100  Pulse 74  Temp 98.4 F (36.9 C) (Oral)  Resp 16  SpO2 96%  Physical Exam  Nursing note and vitals reviewed. Constitutional: No distress.  Musculoskeletal: She exhibits tenderness. She exhibits no edema.       Legs: Neurological: She is alert.  Skin: Skin is warm. No rash noted. No erythema.    ED Course  Procedures (including critical care time)  Labs Reviewed - No data  to display No results found.   No diagnosis found.    MDM          Jimmie Molly, MD 10/23/11 [phone]

## 2011-10-23 NOTE — ED Notes (Signed)
Patient complains of right knee pain x 1 week, no injury

## 2012-01-30 ENCOUNTER — Other Ambulatory Visit: Payer: Self-pay | Admitting: Family Medicine

## 2012-01-30 DIAGNOSIS — Z1231 Encounter for screening mammogram for malignant neoplasm of breast: Secondary | ICD-10-CM

## 2012-02-11 ENCOUNTER — Ambulatory Visit (HOSPITAL_COMMUNITY)
Admission: RE | Admit: 2012-02-11 | Discharge: 2012-02-11 | Disposition: A | Discharge status: Home / Self Care | Payer: Self-pay | Source: Ambulatory Visit | Attending: Family Medicine | Admitting: Family Medicine

## 2012-02-11 DIAGNOSIS — Z1231 Encounter for screening mammogram for malignant neoplasm of breast: Secondary | ICD-10-CM

## 2012-04-08 ENCOUNTER — Encounter (HOSPITAL_COMMUNITY): Payer: Self-pay

## 2012-04-08 ENCOUNTER — Emergency Department (INDEPENDENT_AMBULATORY_CARE_PROVIDER_SITE_OTHER)
Admission: EM | Admit: 2012-04-08 | Discharge: 2012-04-08 | Disposition: A | Discharge status: Home / Self Care | Payer: Self-pay | Source: Home / Self Care | Attending: Emergency Medicine | Admitting: Emergency Medicine

## 2012-04-08 DIAGNOSIS — B029 Zoster without complications: Secondary | ICD-10-CM

## 2012-04-08 HISTORY — DX: Unspecified osteoarthritis, unspecified site: M19.90

## 2012-04-08 MED ORDER — ACYCLOVIR 400 MG PO TABS: 800.0000 mg | ORAL_TABLET | ORAL | Status: DC

## 2012-04-08 MED ORDER — BACITRACIN ZINC 500 UNIT/GM EX OINT: TOPICAL_OINTMENT | Freq: Two times a day (BID) | CUTANEOUS | Status: DC

## 2012-04-08 MED ORDER — CEPHALEXIN 500 MG PO CAPS: 500.0000 mg | ORAL_CAPSULE | Freq: Three times a day (TID) | ORAL | Status: DC

## 2012-04-08 NOTE — ED Provider Notes (Signed)
Chief Complaint:   Chief Complaint  Patient presents with  . Rash    History of Present Illness:   [name] is a 67 year old female who has had a one-week history of a rash in her right axilla. This is itchy and sore. It started out as a few small pustules. It has not spread any since it first began. She denies any fever or chills. She has no rash anywhere else. No difficulty breathing or swelling of lips, tongue, or throat.  Review of Systems:  Other than noted above, the patient denies any of the following symptoms: Systemic:  No fever, chills, sweats, weight loss, or fatigue. ENT:  No nasal congestion, rhinorrhea, sore throat, swelling of lips, tongue or throat. Resp:  No cough, wheezing, or shortness of breath. Skin:  No rash, itching, nodules, or suspicious lesions.  PMFSH:  Past medical history, family history, social history, meds, and allergies were reviewed. She has no medication allergies, she takes hydrochlorothiazide, lisinopril, and diltiazem for high blood pressure, and Naprosyn for arthritis. There no new medications.  Physical Exam:   Vital signs:  BP 155/93  Pulse 86  Temp(Src) 98.3 F (36.8 C) (Oral)  Resp 16  SpO2 96% Gen:  Alert, oriented, in no distress. ENT:  Pharynx clear, no intraoral lesions, moist mucous membranes. Lungs:  Clear to auscultation. Skin:  She has 2 clusters of dried up and scabbed over pustules or vesicles with surrounding erythema in the right axilla. Skin was otherwise clear.  Assessment:  The encounter diagnosis was Shingles.  Her most likely diagnosis is shingles, but this could be a folliculitis or shingles with secondary infection. I'll treat with acyclovir but also add cephalexin and bacitracin ointment to apply topically as well.  Plan:   1.  The following meds were prescribed:   Discharge Medication List as of 04/08/2012  4:21 PM    START taking these medications   Details  acyclovir (ZOVIRAX) 400 MG tablet Take 2 tablets (800  mg total) by mouth every 4 (four) hours while awake., Starting 04/08/2012, Until Discontinued, Normal    bacitracin ointment Apply topically 2 (two) times daily., Starting 04/08/2012, Until Discontinued, Normal    cephALEXin (KEFLEX) 500 MG capsule Take 1 capsule (500 mg total) by mouth 3 (three) times daily., Starting 04/08/2012, Until Discontinued, Normal       2.  The patient was instructed in symptomatic care and handouts were given. 3.  The patient was told to return if becoming worse in any way, if no better in 3 or 4 days, and given some red flag symptoms such as worsening rash, fever, or severe pain that would indicate earlier return.     Reuben Likes, MD 04/08/12 [phone]

## 2012-04-08 NOTE — ED Notes (Signed)
1 week duration of rash on right posterior axilla area. eruption of vesicular type lesions noted, right back area; NAD

## 2012-06-14 ENCOUNTER — Other Ambulatory Visit (INDEPENDENT_AMBULATORY_CARE_PROVIDER_SITE_OTHER): Payer: Medicare Other

## 2012-06-14 DIAGNOSIS — Z8679 Personal history of other diseases of the circulatory system: Secondary | ICD-10-CM

## 2012-06-14 DIAGNOSIS — E785 Hyperlipidemia, unspecified: Secondary | ICD-10-CM | POA: Diagnosis not present

## 2012-06-14 DIAGNOSIS — Z0000 Encounter for general adult medical examination without abnormal findings: Secondary | ICD-10-CM

## 2012-06-14 DIAGNOSIS — E559 Vitamin D deficiency, unspecified: Secondary | ICD-10-CM

## 2012-06-14 LAB — LIPID PANEL
HDL: 43 mg/dL (ref >39)
LDL Cholesterol: 140 mg/dL — ABNORMAL HIGH (ref 0–99)
Total CHOL/HDL Ratio: 4.7 Ratio
Triglycerides: 84 mg/dL (ref <150)

## 2012-06-14 LAB — BASIC METABOLIC PANEL
CO2: 33 mEq/L — ABNORMAL HIGH (ref 19–32)
Calcium: 9.4 mg/dL (ref 8.4–10.5)
Creat: 0.79 mg/dL (ref 0.50–1.10)

## 2012-06-17 ENCOUNTER — Encounter: Payer: Self-pay | Admitting: Family Medicine

## 2012-06-17 ENCOUNTER — Telehealth: Payer: Self-pay | Admitting: Family Medicine

## 2012-06-17 ENCOUNTER — Ambulatory Visit (INDEPENDENT_AMBULATORY_CARE_PROVIDER_SITE_OTHER): Payer: Medicare Other | Admitting: Family Medicine

## 2012-06-17 VITALS — BP 122/74 | HR 78 | Temp 98.1°F | Resp 18 | Ht 64.0 in | Wt 243.0 lb

## 2012-06-17 DIAGNOSIS — I10 Essential (primary) hypertension: Secondary | ICD-10-CM

## 2012-06-17 DIAGNOSIS — Z13820 Encounter for screening for osteoporosis: Secondary | ICD-10-CM | POA: Diagnosis not present

## 2012-06-17 DIAGNOSIS — Z23 Encounter for immunization: Secondary | ICD-10-CM

## 2012-06-17 DIAGNOSIS — Z0000 Encounter for general adult medical examination without abnormal findings: Secondary | ICD-10-CM | POA: Diagnosis not present

## 2012-06-17 DIAGNOSIS — M1990 Unspecified osteoarthritis, unspecified site: Secondary | ICD-10-CM | POA: Insufficient documentation

## 2012-06-17 DIAGNOSIS — K635 Polyp of colon: Secondary | ICD-10-CM | POA: Insufficient documentation

## 2012-06-17 DIAGNOSIS — E785 Hyperlipidemia, unspecified: Secondary | ICD-10-CM

## 2012-06-17 DIAGNOSIS — E2839 Other primary ovarian failure: Secondary | ICD-10-CM

## 2012-06-17 NOTE — Telephone Encounter (Signed)
Its due again in 2015

## 2012-06-17 NOTE — Progress Notes (Signed)
Subjective:    Patient ID: [name], female    DOB: 04-16-1945, 67 y.o.   MRN: [id number]  HPI  Patient is here for her CPE.  She sees GYN for pap, breast exam, and pelvic exam.  She has no major complaints.  She does have episodic low back pain and right knee pain which responds to naprosyn after a few days.  Other than that, she is doing quite well.  She is unsure if her colonoscopy is due.  Her last DEXA was 3 years ago.  Her mammogram was normal in 1/14. She had a BMP and FLP drawn prior to the appointment.  The FLP was significant for LDL 140, HDL 43, chol of 200.    She is not exercising  She is 50-60 pounds above her ideal weight. Past Medical History  Diagnosis Date  . Murmur, heart   . Hyperlipidemia   . Hypertension   . Arthritis   . Colon polyps    Current Outpatient Prescriptions on File Prior to Visit  Medication Sig Dispense Refill  . diltiazem (CARDIZEM) 120 MG tablet Take 120 mg by mouth 3 (three) times daily.      Marland Kitchen guaiFENesin (MUCINEX) 600 MG 12 hr tablet Take 1,200 mg by mouth 2 (two) times daily.      . hydrochlorothiazide (HYDRODIURIL) 25 MG tablet Take 25 mg by mouth daily.      Marland Kitchen lisinopril (PRINIVIL,ZESTRIL) 20 MG tablet Take 20 mg by mouth 2 (two) times daily.      . naproxen (NAPROSYN) 500 MG tablet Take 500 mg by mouth 2 (two) times daily with a meal.      . vitamin B-12 (CYANOCOBALAMIN) 1000 MCG tablet Take 1,000 mcg by mouth daily.       No current facility-administered medications on file prior to visit.   No Known Allergies History   Social History  . Marital Status: Married    Spouse Name: N/A    Number of Children: N/A  . Years of Education: N/A   Occupational History  . Not on file.   Social History Main Topics  . Smoking status: Never Smoker   . Smokeless tobacco: Not on file  . Alcohol Use: No  . Drug Use: No  . Sexually Active: Not on file   Other Topics Concern  . Not on file   Social History Narrative  . No narrative on  file   FH is significant for a brother with colon cancer.  Review of Systems  All other systems reviewed and are negative.       Objective:   Physical Exam  Vitals reviewed. Constitutional: She is oriented to person, place, and time. She appears well-developed and well-nourished.  HENT:  Head: Normocephalic.  Right Ear: External ear normal.  Left Ear: External ear normal.  Nose: Nose normal.  Mouth/Throat: Oropharynx is clear and moist. No oropharyngeal exudate.  Eyes: Conjunctivae are normal. Pupils are equal, round, and reactive to light. Right eye exhibits no discharge. Left eye exhibits no discharge. No scleral icterus.  Neck: Normal range of motion. Neck supple. No JVD present. No thyromegaly present.  Cardiovascular: Normal rate and regular rhythm.  Exam reveals no gallop and no friction rub.   Murmur heard. Pulmonary/Chest: Effort normal and breath sounds normal. No respiratory distress. She has no wheezes. She has no rales. She exhibits no tenderness.  Abdominal: Soft. Bowel sounds are normal. She exhibits no distension and no mass. There is no tenderness. There is  no rebound and no guarding.  Musculoskeletal: Normal range of motion. She exhibits no edema and no tenderness.  Lymphadenopathy:    She has no cervical adenopathy.  Neurological: She is alert and oriented to person, place, and time. She has normal reflexes. She displays normal reflexes. No cranial nerve deficit. She exhibits normal muscle tone. Coordination normal.  Skin: Skin is warm and dry. No rash noted. No erythema. No pallor.  Psychiatric: She has a normal mood and affect. Her behavior is normal. Judgment and thought content normal.          Assessment & Plan:  1. Routine general medical examination at a health care facility We discussed her BMP and fasting lipid panel. She will receive a Pneumovax today. She will call her in insurance regarding the single shot. She can return anytime for shingle shot  if she wants to get that. She will check to see if her colonoscopy is up to date. If we need to schedule that she will notify me. She's not sure if it's 3-5 years when it is due.  Also schedule patient for screening bone density.  2. HTN (hypertension) Blood pressure is well controlled, continue current medications.  3. HLD (hyperlipidemia) Discussed a low saturated fat diet. She will try to limit the consumption of animal meat, fast food, junk food, sweets peaches and a tiny more fresh fruits and vegetables. She doesn't try to increase her aerobic exercise. We'll recheck a fasting lipid panel in 6 months. I have asked her to try to lose 10-15 pounds.  4. Screening for osteoporosis Schedule bone density.  5. Menopause ovarian failure Schedule bone density.

## 2012-06-19 NOTE — Telephone Encounter (Signed)
Pt aware.

## 2012-07-02 ENCOUNTER — Other Ambulatory Visit: Payer: Self-pay | Admitting: Family Medicine

## 2012-07-02 NOTE — Telephone Encounter (Signed)
Medication refilled per protocol. 

## 2012-07-12 ENCOUNTER — Other Ambulatory Visit: Payer: Self-pay | Admitting: Family Medicine

## 2012-07-15 ENCOUNTER — Ambulatory Visit (HOSPITAL_COMMUNITY)
Admission: RE | Admit: 2012-07-15 | Discharge: 2012-07-15 | Disposition: A | Discharge status: Home / Self Care | Payer: Medicare Other | Source: Ambulatory Visit | Attending: Family Medicine | Admitting: Family Medicine

## 2012-07-15 DIAGNOSIS — E2839 Other primary ovarian failure: Secondary | ICD-10-CM

## 2012-07-15 DIAGNOSIS — Z780 Asymptomatic menopausal state: Secondary | ICD-10-CM | POA: Insufficient documentation

## 2012-07-15 DIAGNOSIS — Z13820 Encounter for screening for osteoporosis: Secondary | ICD-10-CM | POA: Insufficient documentation

## 2012-07-15 DIAGNOSIS — Z0000 Encounter for general adult medical examination without abnormal findings: Secondary | ICD-10-CM

## 2012-08-10 DIAGNOSIS — H9209 Otalgia, unspecified ear: Secondary | ICD-10-CM | POA: Diagnosis not present

## 2012-08-10 DIAGNOSIS — M542 Cervicalgia: Secondary | ICD-10-CM | POA: Diagnosis not present

## 2012-08-10 DIAGNOSIS — H93299 Other abnormal auditory perceptions, unspecified ear: Secondary | ICD-10-CM | POA: Diagnosis not present

## 2012-08-10 DIAGNOSIS — R490 Dysphonia: Secondary | ICD-10-CM | POA: Diagnosis not present

## 2012-10-10 ENCOUNTER — Other Ambulatory Visit: Payer: Self-pay | Admitting: Family Medicine

## 2012-10-12 ENCOUNTER — Other Ambulatory Visit: Payer: Self-pay | Admitting: Family Medicine

## 2012-12-20 ENCOUNTER — Ambulatory Visit (INDEPENDENT_AMBULATORY_CARE_PROVIDER_SITE_OTHER): Payer: Medicare Other | Admitting: Family Medicine

## 2012-12-20 ENCOUNTER — Other Ambulatory Visit: Payer: Self-pay | Admitting: Family Medicine

## 2012-12-20 ENCOUNTER — Encounter: Payer: Self-pay | Admitting: Family Medicine

## 2012-12-20 VITALS — BP 136/72 | HR 84 | Temp 97.8°F | Resp 18 | Ht 64.0 in | Wt 250.0 lb

## 2012-12-20 DIAGNOSIS — I10 Essential (primary) hypertension: Secondary | ICD-10-CM

## 2012-12-20 DIAGNOSIS — E785 Hyperlipidemia, unspecified: Secondary | ICD-10-CM

## 2012-12-20 DIAGNOSIS — Z124 Encounter for screening for malignant neoplasm of cervix: Secondary | ICD-10-CM

## 2012-12-20 DIAGNOSIS — M25569 Pain in unspecified knee: Secondary | ICD-10-CM | POA: Diagnosis not present

## 2012-12-20 NOTE — Progress Notes (Signed)
Subjective:    Patient ID: [name], female    DOB: [birth year], 67 y.o.   MRN: [id number]  HPI Patient presents today for a followup. I last outpatient and may. At that time her LDL was elevated at 130. We recommended therapeutic lifestyle changes and weight loss to try to bring her LDL less than 1:30. Unfortunately she has gained 7 pounds since that visit. She admits that she has not been following a diet. She is to recheck a fasting lipid panel. Choi her blood pressures well controlled at 136/72. She denies any chest pain, shortness of breath, dyspnea on exertion. She does complain of bilateral aching knee pain. She denies any injury. This is been a gradual onset over the last several months. It is worse with prolonged standing or walking. Past Medical History  Diagnosis Date  . Murmur, heart   . Hyperlipidemia   . Hypertension   . Arthritis   . Colon polyps    Current Outpatient Prescriptions on File Prior to Visit  Medication Sig Dispense Refill  . diltiazem (CARDIZEM) 120 MG tablet TAKE ONE TABLET BY MOUTH THREE TIMES DAILY  270 tablet  3  . hydrochlorothiazide (HYDRODIURIL) 25 MG tablet TAKE ONE TABLET BY MOUTH ONCE DAILY  90 tablet  0  . lisinopril (PRINIVIL,ZESTRIL) 20 MG tablet TAKE TWO TABLETS BY MOUTH ONCE DAILY  180 tablet  0  . naproxen (NAPROSYN) 500 MG tablet Take 500 mg by mouth 2 (two) times daily with a meal.      . Cholecalciferol (VITAMIN D-3) 1000 UNITS CAPS Take by mouth.      . vitamin B-12 (CYANOCOBALAMIN) 1000 MCG tablet Take 1,000 mcg by mouth daily.       No current facility-administered medications on file prior to visit.   Past Surgical History  Procedure Laterality Date  . Abdominal hysterectomy     No Known Allergies History   Social History  . Marital Status: Married    Spouse Name: N/A    Number of Children: N/A  . Years of Education: N/A   Occupational History  . Not on file.   Social History Main Topics  . Smoking status: Never Smoker    . Smokeless tobacco: Not on file  . Alcohol Use: No  . Drug Use: No  . Sexual Activity: Not on file   Other Topics Concern  . Not on file   Social History Narrative  . No narrative on file      Review of Systems  All other systems reviewed and are negative.       Objective:   Physical Exam  Vitals reviewed. Neck: Neck supple. No JVD present.  Cardiovascular: Normal rate and regular rhythm.  Exam reveals no gallop and no friction rub.   No murmur heard. Pulmonary/Chest: Effort normal and breath sounds normal. No respiratory distress. She has no wheezes. She has no rales. She exhibits no tenderness.  Abdominal: Soft. Bowel sounds are normal. She exhibits no distension. There is no tenderness. There is no rebound and no guarding.  Musculoskeletal:       Left knee: She exhibits decreased range of motion. She exhibits no swelling, no effusion, no LCL laxity, normal patellar mobility, normal meniscus and no MCL laxity. Tenderness found. Patellar tendon tenderness noted. No medial joint line, no lateral joint line, no MCL and no LCL tenderness noted.  Lymphadenopathy:    She has no cervical adenopathy.          Assessment & Plan:  1. HTN (hypertension) Blood pressures currently well controlled. Continue present medications at their current dosages.  2. HLD (hyperlipidemia) LDL is less than 130. Return fasting to check fasting lipid panel. I will also check a CMP. Also recommended a flu shot as well and is Prevnar 13 in one year. She declines the flu shot today. - COMPLETE METABOLIC PANEL WITH GFR; Future - Lipid panel; Future  3. Knee pain, unspecified laterality I suspect arthritis. I recommended glucosamine/chondroitin sulfate over-the-counter. I recommended weight loss. I recommended increasing Naprosyn to 500 mg by mouth twice a day as needed.

## 2013-01-03 ENCOUNTER — Other Ambulatory Visit: Payer: Self-pay | Admitting: Family Medicine

## 2013-02-01 ENCOUNTER — Other Ambulatory Visit: Payer: Self-pay | Admitting: Family Medicine

## 2013-02-08 ENCOUNTER — Other Ambulatory Visit: Payer: Self-pay | Admitting: Family Medicine

## 2013-02-08 MED ORDER — NAPROXEN 500 MG PO TABS: ORAL_TABLET | ORAL | Status: DC

## 2013-02-08 NOTE — Telephone Encounter (Signed)
Rx Refilled  

## 2013-02-18 ENCOUNTER — Telehealth: Payer: Self-pay | Admitting: Family Medicine

## 2013-02-18 MED ORDER — AMOXICILLIN 875 MG PO TABS
875.0000 mg | ORAL_TABLET | Freq: Two times a day (BID) | ORAL | Status: DC
Start: 2013-02-18 — End: 2013-06-29

## 2013-02-18 NOTE — Telephone Encounter (Signed)
Has been having a cold lately. A lot of head congestion, drainage.  Mostly on right side.  Now having problem with rt eye.  Eye swollen and and draining.  Wants you to call her in antibiotic.  No appts today.

## 2013-02-18 NOTE — Telephone Encounter (Signed)
Amoxicillin 875 bid for 10 days for sinus infection.

## 2013-02-18 NOTE — Telephone Encounter (Signed)
Rx to pharmacy and pt aware.  Will NTBS if worsens or not improving.

## 2013-02-28 ENCOUNTER — Other Ambulatory Visit (HOSPITAL_COMMUNITY): Payer: Self-pay | Admitting: Obstetrics and Gynecology

## 2013-02-28 DIAGNOSIS — Z1231 Encounter for screening mammogram for malignant neoplasm of breast: Secondary | ICD-10-CM

## 2013-03-01 ENCOUNTER — Ambulatory Visit (HOSPITAL_COMMUNITY)
Admission: RE | Admit: 2013-03-01 | Discharge: 2013-03-01 | Disposition: A | Discharge status: Home / Self Care | Payer: Medicare Other | Source: Ambulatory Visit | Attending: Obstetrics and Gynecology | Admitting: Obstetrics and Gynecology

## 2013-03-01 DIAGNOSIS — Z1231 Encounter for screening mammogram for malignant neoplasm of breast: Secondary | ICD-10-CM | POA: Diagnosis not present

## 2013-03-07 DIAGNOSIS — H02829 Cysts of unspecified eye, unspecified eyelid: Secondary | ICD-10-CM | POA: Diagnosis not present

## 2013-03-07 DIAGNOSIS — H25099 Other age-related incipient cataract, unspecified eye: Secondary | ICD-10-CM | POA: Diagnosis not present

## 2013-03-09 ENCOUNTER — Encounter: Payer: Medicare Other | Admitting: Obstetrics & Gynecology

## 2013-05-18 DIAGNOSIS — R1032 Left lower quadrant pain: Secondary | ICD-10-CM | POA: Diagnosis not present

## 2013-05-18 DIAGNOSIS — R1904 Left lower quadrant abdominal swelling, mass and lump: Secondary | ICD-10-CM | POA: Diagnosis not present

## 2013-05-18 DIAGNOSIS — Z9079 Acquired absence of other genital organ(s): Secondary | ICD-10-CM | POA: Diagnosis not present

## 2013-05-24 ENCOUNTER — Other Ambulatory Visit: Payer: Self-pay | Admitting: Obstetrics and Gynecology

## 2013-05-24 DIAGNOSIS — Z8719 Personal history of other diseases of the digestive system: Secondary | ICD-10-CM

## 2013-05-24 DIAGNOSIS — R1032 Left lower quadrant pain: Secondary | ICD-10-CM

## 2013-05-24 DIAGNOSIS — N9489 Other specified conditions associated with female genital organs and menstrual cycle: Secondary | ICD-10-CM

## 2013-05-26 ENCOUNTER — Encounter: Payer: Self-pay | Admitting: Gastroenterology

## 2013-05-27 ENCOUNTER — Ambulatory Visit
Admission: RE | Admit: 2013-05-27 | Discharge: 2013-05-27 | Disposition: A | Discharge status: Home / Self Care | Payer: Medicare Other | Source: Ambulatory Visit | Attending: Obstetrics and Gynecology | Admitting: Obstetrics and Gynecology

## 2013-05-27 DIAGNOSIS — R1032 Left lower quadrant pain: Secondary | ICD-10-CM

## 2013-05-27 DIAGNOSIS — K838 Other specified diseases of biliary tract: Secondary | ICD-10-CM | POA: Diagnosis not present

## 2013-05-27 DIAGNOSIS — N9489 Other specified conditions associated with female genital organs and menstrual cycle: Secondary | ICD-10-CM

## 2013-05-27 DIAGNOSIS — Z8719 Personal history of other diseases of the digestive system: Secondary | ICD-10-CM

## 2013-05-27 MED ORDER — IOHEXOL 300 MG/ML  SOLN
125.0000 mL | Freq: Once | INTRAMUSCULAR | Status: AC | PRN
  Administered 2013-05-27: 125 mL via INTRAVENOUS

## 2013-06-24 DIAGNOSIS — N9489 Other specified conditions associated with female genital organs and menstrual cycle: Secondary | ICD-10-CM | POA: Diagnosis not present

## 2013-06-29 ENCOUNTER — Encounter: Payer: Self-pay | Admitting: Podiatry

## 2013-06-29 ENCOUNTER — Ambulatory Visit (INDEPENDENT_AMBULATORY_CARE_PROVIDER_SITE_OTHER): Payer: Medicare Other

## 2013-06-29 ENCOUNTER — Ambulatory Visit (INDEPENDENT_AMBULATORY_CARE_PROVIDER_SITE_OTHER): Payer: Medicare Other | Admitting: Podiatry

## 2013-06-29 VITALS — BP 141/95 | HR 90 | Resp 16 | Ht 64.0 in | Wt 245.0 lb

## 2013-06-29 DIAGNOSIS — M2010 Hallux valgus (acquired), unspecified foot: Secondary | ICD-10-CM | POA: Diagnosis not present

## 2013-06-29 DIAGNOSIS — M779 Enthesopathy, unspecified: Secondary | ICD-10-CM

## 2013-06-29 DIAGNOSIS — M7750 Other enthesopathy of unspecified foot: Secondary | ICD-10-CM

## 2013-06-29 MED ORDER — TRIAMCINOLONE ACETONIDE 10 MG/ML IJ SUSP
10.0000 mg | Freq: Once | INTRAMUSCULAR | Status: AC
  Administered 2013-06-29: 10 mg

## 2013-06-29 NOTE — Progress Notes (Signed)
   Subjective:    Patient ID: [name], female    DOB: 1945/12/08, 68 y.o.   MRN: [id number]  HPI Comments: This right foot has been hurting me for a few months now on top when walking , its a throbbing pain   Foot Pain      Review of Systems  All other systems reviewed and are negative.      Objective:   Physical Exam        Assessment & Plan:

## 2013-06-29 NOTE — Progress Notes (Signed)
Subjective:     Patient ID: [name], female   DOB: 31-Aug-1945, 68 y.o.   MRN: [id number]  Foot Pain   patient presents with pain on top of the right foot stating that it has become increasingly tender over the last 4 months   Review of Systems  All other systems reviewed and are negative.      Objective:   Physical Exam  Nursing note and vitals reviewed. Constitutional: She is oriented to person, place, and time.  Cardiovascular: Intact distal pulses.   Musculoskeletal: Normal range of motion.  Neurological: She is oriented to person, place, and time.  Skin: Skin is warm.   neurovascular status is intact with muscle strength adequate and range of motion subtalar midtarsal joint within normal limits. Patient is found to have exquisite discomfort dorsum of the right foot with inflammation of the extensor tendon complex and no indications of increased edema or other pathological issue     Assessment:     Probable tendinitis dorsal right foot cannot rule out stress fracture or arthritis    Plan:     H&P and x-rays reviewed. Injected the dorsal tendon complex 3 mg Kenalog 5 mg Xylocaine Marcaine mixture and advised on physical therapy and wide reappoint as needed bottom shoes

## 2013-07-01 ENCOUNTER — Other Ambulatory Visit: Payer: Self-pay | Admitting: Family Medicine

## 2013-07-05 ENCOUNTER — Ambulatory Visit: Payer: Medicare Other | Attending: Gynecologic Oncology | Admitting: Gynecologic Oncology

## 2013-07-05 VITALS — BP 172/94 | HR 71 | Temp 98.1°F | Resp 20 | Ht 63.54 in | Wt 250.6 lb

## 2013-07-05 DIAGNOSIS — Z86010 Personal history of colonic polyps: Secondary | ICD-10-CM

## 2013-07-05 DIAGNOSIS — Z90710 Acquired absence of both cervix and uterus: Secondary | ICD-10-CM | POA: Diagnosis not present

## 2013-07-05 DIAGNOSIS — Z800 Family history of malignant neoplasm of digestive organs: Secondary | ICD-10-CM | POA: Diagnosis not present

## 2013-07-05 DIAGNOSIS — R1900 Intra-abdominal and pelvic swelling, mass and lump, unspecified site: Secondary | ICD-10-CM | POA: Insufficient documentation

## 2013-07-05 DIAGNOSIS — N9489 Other specified conditions associated with female genital organs and menstrual cycle: Secondary | ICD-10-CM

## 2013-07-05 DIAGNOSIS — M25559 Pain in unspecified hip: Secondary | ICD-10-CM | POA: Insufficient documentation

## 2013-07-05 NOTE — Patient Instructions (Signed)
You are scheduled for surgery for July 26, 2013 with Dr. Skeet Latch.               Preparing for your Surgery  Pre-operative Testing -You will receive a phone call from presurgical testing at [facility] to arrange for a pre-operative testing appointment before your surgery.  This appointment normally occurs one to two weeks before your scheduled surgery.   -Bring your insurance card, copy of an advanced directive if applicable, medication list  -At that visit, you will be asked to sign a consent for a possible blood transfusion in case a transfusion becomes necessary during surgery.  The need for a blood transfusion is rare but having consent is a necessary part of your care.     Day Before Surgery at [place] will be asked to take in only clear liquids the day before surgery.  Examples of clear liquids include broths, jello, and clear juices.   You will be advised to have nothing to eat or drink after midnight the evening before.    Your role in recovery Your role is to become active as soon as directed by your doctor, while still giving yourself time to heal.  Rest when you feel tired. You will be asked to do the following in order to speed your recovery:  - Cough and breathe deeply. This helps toclear and expand your lungs and can prevent pneumonia. You may be given a spirometer to practice deep breathing. A staff member will show you how to use the spirometer. - Do mild physical activity. Walking or moving your legs help your circulation and body functions return to normal. A staff member will help you when you try to walk and will provide you with simple exercises. Do not try to get up or walk alone the first time. - Actively manage your pain. Managing your pain lets you move in comfort. We will ask you to rate your pain on a scale of zero to 10. It is your responsibility to tell your doctor or nurse where and how much you hurt so your pain can be treated.  Special  Considerations -If you are diabetic, you may be placed on insulin after surgery to have closer control over your blood sugars to promote healing and recovery.  This does not mean that you will be discharged on insulin.  If applicable, your oral antidiabetics will be resumed when you are tolerating a solid diet.  -Your final pathology results from surgery should be available by the Friday after surgery and the results will be relayed to you when available.

## 2013-07-05 NOTE — Progress Notes (Signed)
Consult Note: Gyn-Onc  Consult was requested by Dr. Elly Modena for the evaluation of [name] 68 y.o. female  CC: Pelvic mass pelvic pain  Assessment/Plan:  [name] is a 68 year old status post prior hysterectomy left salpingo-oophorectomy and right salpingectomy done completed in numerous phases. She now presents with persistent left pelvic fullness and discomfort over the last 2-3 years. Imaging is notable for the presence of a left adnexal mass. CA 125 is within normal limits. She is aware that the imaging findings may be the result of adhesions based on the multiple abdominal procedures and diverticulosis. The plan is for minimally invasive approach with the plan to perform left salpingo-oophorectomy and right oophorectomy based upon the accessibility of the adnexa. She is aware that even with laparotomy and exploration the adnexa may not be valuable if in case in dense adhesions because at that time a risk benefit analysis would have to be performed. Resumption of surgery discussed with the patient her inclusive of infection bleeding damage to other structures prolonged hospitalization and reoperation. The procedure will occur on 07/26/2013  HPI:  [name]  is a 68 y.o.  gravida 4 para 3 last normal menstrual period in the early 1980s. Patient subsequently underwent vaginal hysterectomy she says for sterilization. She subsequently underwent a right salpingectomy in the 1990s for a symptomatic right tubal cyst. In 2004 she presented with complaints of pelvic pain. She underwent a laparoscopy and that was challenged by numerous intra-abdominal adhesions requiring laparotomy. The procedure performed was lysis of adhesions left salpingo-oophorectomy and right ovarian cystectomy. The pathology from that procedure is notable for left hydrosalpinx a benign hemorrhagic left ovarian cyst and a benign right ovarian follicular cyst.  [name] did well until approximately 2-3 years ago. She  presents with complaints of left pelvic fullness worse over the last 6 months. The pain is localized to the left lower quadrant and radiates 5/10. She denies any associated nausea or vomiting, diarrhea febrile episodes or rectal bleeding. The pain is self-limiting and there are no aggravating factors. She reports good appetite stable weight no dominant bloating.   A CT scan of the abdomen and pelvis collected on 05/27/2013 demonstrates the presence of colonic diverticuli without surrounding inflammatory changes to suggest acute diverticulitis there is no significant ascites. The right ovary is atrophic. Left ovary appears slightly prominent measuring approximately 4.2 x 2.7 x 3.5 cm. CA 125 returned to value of 7.  Family history is notable for mother with colon cancer diagnosed in her 32s. The patient's last colonoscopy was in September 2010 at which polyps were removed.   Current Meds:  Outpatient Encounter Prescriptions as of 07/05/2013  Medication Sig  . Cholecalciferol (VITAMIN D-3) 1000 UNITS CAPS Take by mouth.  . diltiazem (CARDIZEM) 120 MG tablet TAKE ONE TABLET BY MOUTH THREE TIMES DAILY  . hydrochlorothiazide (HYDRODIURIL) 25 MG tablet TAKE ONE TABLET BY MOUTH ONCE DAILY  . lisinopril (PRINIVIL,ZESTRIL) 20 MG tablet TAKE TWO TABLETS BY MOUTH ONCE DAILY  . naproxen (NAPROSYN) 500 MG tablet TAKE ONE TABLET BY MOUTH TWICE DAILY AS NEEDED ARTHRITIS  . NON FORMULARY Take 1 each by mouth daily.  . vitamin B-12 (CYANOCOBALAMIN) 1000 MCG tablet Take 1,000 mcg by mouth daily.    Allergy: No Known Allergies  Social Hx:   History   Social History  . Marital Status: Married    Spouse Name: N/A    Number of Children: N/A  . Years of Education: N/A  Occupational History  . Not on file.   Social History Main Topics  . Smoking status: Never Smoker   . Smokeless tobacco: Never Used  . Alcohol Use: No  . Drug Use: No  . Sexual Activity: Not on file   Other Topics Concern  . Not on  file   Social History Narrative  . No narrative on file    Past Surgical Hx:  Past Surgical History  Procedure Laterality Date  . Abdominal hysterectomy      Past Medical Hx:  Past Medical History  Diagnosis Date  . Murmur, heart   . Hyperlipidemia   . Hypertension   . Arthritis   . Colon polyps     Past Gynecological History:  Gravida 4 para 3 last Pap test 3-4 years ago within normal limits menarche occurred age of 30 with regular menses. Reports oral contraceptive pill use for 10 years. Reports hysterectomy for sterilization. No LMP recorded. Patient has had a hysterectomy.  Family Hx: No family history on file. Mother colon cancer Dx in her 39's  Review of Systems:  Constitutional  Feels well, no recent febrile episodes Cardiovascular  No chest pain, shortness of breath, or edema  Pulmonary  No cough or wheeze.  Gastro Intestinal  No nausea, vomitting, or diarrhoea. No bright red blood per rectum, no abdominal pain, change in bowel movement, or constipation. Genito Urinary  No frequency, urgency, dysuria, no vaginal bleeding Musculo Skeletal  No myalgia, arthralgia, joint swelling or pain  Neurologic  No weakness, numbness, change in gait,    Vitals:  Blood pressure 172/94, pulse 71, temperature 98.1 F (36.7 C), temperature source Oral, resp. rate 20, height 5' 3.54" (1.614 m), weight 250 lb 9.6 oz (113.671 kg).  Physical Exam: WD in NAD Neck  Supple NROM, without any enlargements.  Lymph Node Survey No cervical supraclavicular or inguinal adenopathy Cardiovascular  Pulse normal rate, regularity and rhythm. S1 and S2 normal.  Lungs  Clear to auscultation bilateraly  Psychiatry  Alert and oriented to person, appropriate mood speech and affect. Abdomen  Normoactive bowel sounds, abdomen soft, non-tender and obese. Surgical  sites intact without evidence of hernia.  Back No CVA tenderness Genito Urinary  Vulva/vagina: Normal external female genitalia.   No lesions. No discharge or bleeding.  Bladder/urethra:  No lesions or masses  Vagina: atrophc no lesions  Adnexa: No palpable masses, no cul de sac nodularity. Rectal  Good tone, no masses no cul de sac nodularity.  Extremities  No bilateral cyanosis, clubbing or edema.   Janie Morning, MD, PhD 07/05/2013, 5:51 PM

## 2013-07-08 ENCOUNTER — Telehealth: Payer: Self-pay | Admitting: Family Medicine

## 2013-07-08 NOTE — Telephone Encounter (Signed)
LMTRC

## 2013-07-08 NOTE — Telephone Encounter (Signed)
Also have rec'd request for refill of Lisinopril from pharmacy.  Have tried to call patient again.  LMTCB.  Will speak to patient first before refill incase there is a problem she wanted to discuss.

## 2013-07-08 NOTE — Telephone Encounter (Signed)
Message copied by Alyson Locket on Fri Jul 08, 2013  9:35 AM ------      Message from: Devoria Glassing      Created: Thu Jul 07, 2013  2:42 PM       Patient is calling to speak with you about her lisinopril rx       Please call her at [phone]  ------

## 2013-07-11 ENCOUNTER — Telehealth: Payer: Self-pay | Admitting: Family Medicine

## 2013-07-11 ENCOUNTER — Encounter (HOSPITAL_COMMUNITY): Payer: Self-pay | Admitting: Pharmacy Technician

## 2013-07-11 NOTE — Telephone Encounter (Signed)
[phone]   Pt is needing a refill on lisinopril (PRINIVIL,ZESTRIL) 20 MG tablet she is completely out she states she has tried to call office several times for refill and the pharmacy has tried to fax it to [place] too.   Pharmacy [facility] [place], [place] - [address]

## 2013-07-12 MED ORDER — LISINOPRIL 20 MG PO TABS: 40.0000 mg | ORAL_TABLET | Freq: Every morning | ORAL | Status: DC

## 2013-07-12 NOTE — Telephone Encounter (Signed)
Rx Refilled  

## 2013-07-14 ENCOUNTER — Encounter (HOSPITAL_COMMUNITY): Payer: Self-pay

## 2013-07-14 ENCOUNTER — Ambulatory Visit (HOSPITAL_COMMUNITY)
Admission: RE | Admit: 2013-07-14 | Discharge: 2013-07-14 | Disposition: A | Discharge status: Home / Self Care | Payer: Medicare Other | Source: Ambulatory Visit | Attending: Anesthesiology | Admitting: Anesthesiology

## 2013-07-14 ENCOUNTER — Encounter (HOSPITAL_COMMUNITY)
Admission: RE | Admit: 2013-07-14 | Discharge: 2013-07-14 | Disposition: A | Discharge status: Home / Self Care | Payer: Medicare Other | Source: Ambulatory Visit | Attending: Gynecologic Oncology | Admitting: Gynecologic Oncology

## 2013-07-14 DIAGNOSIS — N949 Unspecified condition associated with female genital organs and menstrual cycle: Secondary | ICD-10-CM | POA: Insufficient documentation

## 2013-07-14 DIAGNOSIS — Z01812 Encounter for preprocedural laboratory examination: Secondary | ICD-10-CM | POA: Diagnosis not present

## 2013-07-14 DIAGNOSIS — Z01818 Encounter for other preprocedural examination: Secondary | ICD-10-CM | POA: Insufficient documentation

## 2013-07-14 DIAGNOSIS — E785 Hyperlipidemia, unspecified: Secondary | ICD-10-CM | POA: Insufficient documentation

## 2013-07-14 DIAGNOSIS — Z01810 Encounter for preprocedural cardiovascular examination: Secondary | ICD-10-CM | POA: Diagnosis not present

## 2013-07-14 DIAGNOSIS — I10 Essential (primary) hypertension: Secondary | ICD-10-CM | POA: Diagnosis not present

## 2013-07-14 HISTORY — DX: Allergy status to unspecified drugs, medicaments and biological substances: Z88.9

## 2013-07-14 LAB — COMPREHENSIVE METABOLIC PANEL
ALT: 16 U/L (ref 0–35)
AST: 18 U/L (ref 0–37)
Albumin: 4 g/dL (ref 3.5–5.2)
Alkaline Phosphatase: 65 U/L (ref 39–117)
BILIRUBIN TOTAL: 0.3 mg/dL (ref 0.3–1.2)
BUN: 11 mg/dL (ref 6–23)
CHLORIDE: 98 meq/L (ref 96–112)
CO2: 32 mEq/L (ref 19–32)
Calcium: 9.6 mg/dL (ref 8.4–10.5)
Creatinine, Ser: 0.81 mg/dL (ref 0.50–1.10)
GFR calc non Af Amer: 74 mL/min — ABNORMAL LOW (ref >90)
GFR, EST AFRICAN AMERICAN: 86 mL/min — AB (ref >90)
GLUCOSE: 92 mg/dL (ref 70–99)
POTASSIUM: 3.9 meq/L (ref 3.7–5.3)
Sodium: 140 mEq/L (ref 137–147)
Total Protein: 7.5 g/dL (ref 6.0–8.3)

## 2013-07-14 LAB — CBC WITH DIFFERENTIAL/PLATELET
Basophils Absolute: 0 10*3/uL (ref 0.0–0.1)
Basophils Relative: 1 % (ref 0–1)
Eosinophils Absolute: 0.3 10*3/uL (ref 0.0–0.7)
Eosinophils Relative: 3 % (ref 0–5)
HEMATOCRIT: 43.4 % (ref 36.0–46.0)
HEMOGLOBIN: 14.2 g/dL (ref 12.0–15.0)
LYMPHS ABS: 1.9 10*3/uL (ref 0.7–4.0)
Lymphocytes Relative: 24 % (ref 12–46)
MCH: 29.1 pg (ref 26.0–34.0)
MCHC: 32.7 g/dL (ref 30.0–36.0)
MCV: 88.9 fL (ref 78.0–100.0)
MONOS PCT: 7 % (ref 3–12)
Monocytes Absolute: 0.5 10*3/uL (ref 0.1–1.0)
NEUTROS ABS: 5.3 10*3/uL (ref 1.7–7.7)
NEUTROS PCT: 65 % (ref 43–77)
Platelets: 244 10*3/uL (ref 150–400)
RBC: 4.88 MIL/uL (ref 3.87–5.11)
RDW: 12.8 % (ref 11.5–15.5)
WBC: 8 10*3/uL (ref 4.0–10.5)

## 2013-07-14 LAB — URINALYSIS, ROUTINE W REFLEX MICROSCOPIC
Bilirubin Urine: NEGATIVE
GLUCOSE, UA: NEGATIVE mg/dL
Hgb urine dipstick: NEGATIVE
KETONES UR: NEGATIVE mg/dL
LEUKOCYTES UA: NEGATIVE
NITRITE: NEGATIVE
Protein, ur: NEGATIVE mg/dL
Specific Gravity, Urine: 1.006 (ref 1.005–1.030)
Urobilinogen, UA: 0.2 mg/dL (ref 0.0–1.0)
pH: 7 (ref 5.0–8.0)

## 2013-07-14 NOTE — Patient Instructions (Addendum)
20 TYSHEA IMEL  07/14/2013   Your procedure is scheduled on:   07-26-2013  Enter through [facility] Entrance and follow signs to [place]. Arrive at [address] [employer].        Call this number if you have problems the morning of surgery: [phone]  Or Presurgical Testing [phone]([place]) For Living Will and/or Health Care Power Attorney Forms: please provide copy for your medical record,may bring AM of surgery(Forms should be already notarized -we do not provide this service).(07-14-13  No information preferred today).  Remember: Follow any bowel prep instructions per MD office.(Clear Liquids x 24 hours preop-may continue Until 06-29-13 0900 AM- then nothing). For Cpap use: Bring mask and tubing only.   Do not eat food:After Midnight.    Take these medicines the morning of surgery with A SIP OF WATER: Diltiazem.  Use eye drops as needed.    Do not wear jewelry, make-up or nail polish.  Do not wear lotions, powders, or perfumes. You may wear deodorant.  Do not shave 48 hours(2 days) prior to first CHG shower(legs and under arms).(Shaving face and neck okay.)  Do not bring valuables to the hospital.(Hospital is not responsible for lost valuables).  Contacts, dentures or removable bridgework, body piercing, hair pins may not be worn into surgery.  Leave suitcase in the car. After surgery it may be brought to your room.  For patients admitted to the hospital, checkout time is 11:00 AM the day of discharge.(Restricted visitors-Any Persons displaying flu-like symptoms or illness).    Patients discharged the day of surgery will not be allowed to drive home. Must have responsible person with you x 24 hours once discharged.  Name and phone number of your driver: spouse Laelynn Blizzard [phone] cell or Brettany Sydney, daughter [phone]   Special Instructions: CHG(Chlorhedine 4%-"Hibiclens","Betasept","Aplicare") Shower Use Special Wash: see special instructions.(avoid face and  genitals)   Please read over the following fact sheets that you were given:  Blood Transfusion fact sheet, Incentive Spirometry Instruction.  Remember : Type/Screen "Blue armbands" - may not be removed once applied(would result in being retested AM of surgery, if removed).  Failure to follow these instructions may result in Cancellation of your surgery.   _______________________________    [facility] - Preparing for Surgery Before surgery, you can play an important role.  Because skin is not sterile, your skin needs to be as free of germs as possible.  You can reduce the number of germs on your skin by washing with CHG (chlorahexidine gluconate) soap before surgery.  CHG is an antiseptic cleaner which kills germs and bonds with the skin to continue killing germs even after washing. Please DO NOT use if you have an allergy to CHG or antibacterial soaps.  If your skin becomes reddened/irritated stop using the CHG and inform your nurse when you arrive at Short Stay. Do not shave (including legs and underarms) for at least 48 hours prior to the first CHG shower.  You may shave your face/neck. Please follow these instructions carefully:  1.  Shower with CHG Soap the night before surgery and the  morning of Surgery.  2.  If you choose to wash your hair, wash your hair first as usual with your  normal  shampoo.  3.  After you shampoo, rinse your hair and body thoroughly to remove the  shampoo.  4.  Use CHG as you would any other liquid soap.  You can apply chg directly  to the skin and wash                       Gently with a scrungie or clean washcloth.  5.  Apply the CHG Soap to your body ONLY FROM THE NECK DOWN.   Do not use on face/ open                           Wound or open sores. Avoid contact with eyes, ears mouth and genitals (private parts).                       Wash face,  Genitals (private parts) with your normal soap.             6.  Wash thoroughly, paying  special attention to the area where your surgery  will be performed.  7.  Thoroughly rinse your body with warm water from the neck down.  8.  DO NOT shower/wash with your normal soap after using and rinsing off  the CHG Soap.                9.  Pat yourself dry with a clean towel.            10.  Wear clean pajamas.            11.  Place clean sheets on your bed the night of your first shower and do not  sleep with pets. Day of Surgery : Do not apply any lotions/deodorants the morning of surgery.  Please wear clean clothes to the hospital/surgery center.  FAILURE TO FOLLOW THESE INSTRUCTIONS MAY RESULT IN THE CANCELLATION OF YOUR SURGERY PATIENT SIGNATURE_________________________________  NURSE SIGNATURE__________________________________  ________________________________________________________________________    CLEAR LIQUID DIET   Foods Allowed                                                                     Foods Excluded  Coffee and tea, regular and decaf                             liquids that you cannot  Plain Jell-O in any flavor                                             see through such as: Fruit ices (not with fruit pulp)                                     milk, soups, orange juice  Iced Popsicles                                    All solid food Carbonated beverages, regular and diet  Cranberry, grape and apple juices Sports drinks like Gatorade Lightly seasoned clear broth or consume(fat free) Sugar, honey syrup  Sample Menu Breakfast                                Lunch                                     Supper Cranberry juice                    Beef broth                            Chicken broth Jell-O                                     Grape juice                           Apple juice Coffee or tea                        Jell-O                                      Popsicle                                                 Coffee or tea                        Coffee or tea  _____________________________________________________________________    Incentive Spirometer  An incentive spirometer is a tool that can help keep your lungs clear and active. This tool measures how well you are filling your lungs with each breath. Taking long deep breaths may help reverse or decrease the chance of developing breathing (pulmonary) problems (especially infection) following:  A long period of time when you are unable to move or be active. BEFORE THE PROCEDURE   If the spirometer includes an indicator to show your best effort, your nurse or respiratory therapist will set it to a desired goal.  If possible, sit up straight or lean slightly forward. Try not to slouch.  Hold the incentive spirometer in an upright position. INSTRUCTIONS FOR USE  1. Sit on the edge of your bed if possible, or sit up as far as you can in bed or on a chair. 2. Hold the incentive spirometer in an upright position. 3. Breathe out normally. 4. Place the mouthpiece in your mouth and seal your lips tightly around it. 5. Breathe in slowly and as deeply as possible, raising the piston or the ball toward the top of the column. 6. Hold your breath for 3-5 seconds or for as long as possible. Allow the piston or ball to fall to the bottom of the column. 7. Remove the mouthpiece from your mouth and breathe out normally. 8. Rest for a few seconds and repeat Steps 1 through 7 at  least 10 times every 1-2 hours when you are awake. Take your time and take a few normal breaths between deep breaths. 9. The spirometer may include an indicator to show your best effort. Use the indicator as a goal to work toward during each repetition. 10. After each set of 10 deep breaths, practice coughing to be sure your lungs are clear. If you have an incision (the cut made at the time of surgery), support your incision when coughing by placing a pillow or rolled up towels firmly  against it. Once you are able to get out of bed, walk around indoors and cough well. You may stop using the incentive spirometer when instructed by your caregiver.  RISKS AND COMPLICATIONS  Take your time so you do not get dizzy or light-headed.  If you are in pain, you may need to take or ask for pain medication before doing incentive spirometry. It is harder to take a deep breath if you are having pain. AFTER USE  Rest and breathe slowly and easily.  It can be helpful to keep track of a log of your progress. Your caregiver can provide you with a simple table to help with this. If you are using the spirometer at home, follow these instructions: [place] IF:   You are having difficultly using the spirometer.  You have trouble using the spirometer as often as instructed.  Your pain medication is not giving enough relief while using the spirometer.  You develop fever of 100.5 F (38.1 C) or higher. SEEK IMMEDIATE MEDICAL CARE IF:   You cough up bloody sputum that had not been present before.  You develop fever of 102 F (38.9 C) or greater.  You develop worsening pain at or near the incision site. MAKE SURE YOU:   Understand these instructions.  Will watch your condition.  Will get help right away if you are not doing well or get worse. Document Released: 05/26/2006 Document Revised: 04/07/2011 Document Reviewed: 07/27/2006 Exit[employer] Patient Information 2014 Exit[employer], [place].   ________________________________________________________________________  WHAT IS A BLOOD TRANSFUSION? Blood Transfusion Information  A transfusion is the replacement of blood or some of its parts. Blood is made up of multiple cells which provide different functions.  Red blood cells carry oxygen and are used for blood loss replacement.  White blood cells fight against infection.  Platelets control bleeding.  Plasma helps clot blood.  Other blood products are available for  specialized needs, such as hemophilia or other clotting disorders. BEFORE THE TRANSFUSION  Who gives blood for transfusions?   Healthy volunteers who are fully evaluated to make sure their blood is safe. This is blood bank blood. Transfusion therapy is the safest it has ever been in the practice of medicine. Before blood is taken from a donor, a complete history is taken to make sure that person has no history of diseases nor engages in risky social behavior (examples are intravenous drug use or sexual activity with multiple partners). The donor's travel history is screened to minimize risk of transmitting infections, such as malaria. The donated blood is tested for signs of infectious diseases, such as HIV and hepatitis. The blood is then tested to be sure it is compatible with you in order to minimize the chance of a transfusion reaction. If you or a relative donates blood, this is often done in anticipation of surgery and is not appropriate for emergency situations. It takes many days to process the donated blood. RISKS AND COMPLICATIONS Although transfusion  therapy is very safe and saves many lives, the main dangers of transfusion include:   Getting an infectious disease.  Developing a transfusion reaction. This is an allergic reaction to something in the blood you were given. Every precaution is taken to prevent this. The decision to have a blood transfusion has been considered carefully by your caregiver before blood is given. Blood is not given unless the benefits outweigh the risks. AFTER THE TRANSFUSION  Right after receiving a blood transfusion, you will usually feel much better and more energetic. This is especially true if your red blood cells have gotten low (anemic). The transfusion raises the level of the red blood cells which carry oxygen, and this usually causes an energy increase.  The nurse administering the transfusion will monitor you carefully for complications. HOME CARE  INSTRUCTIONS  No special instructions are needed after a transfusion. You may find your energy is better. Speak with your caregiver about any limitations on activity for underlying diseases you may have. SEEK MEDICAL CARE IF:   Your condition is not improving after your transfusion.  You develop redness or irritation at the intravenous (IV) site. SEEK IMMEDIATE MEDICAL CARE IF:  Any of the following symptoms occur over the next 12 hours:  Shaking chills.  You have a temperature by mouth above 102 F (38.9 C), not controlled by medicine.  Chest, back, or muscle pain.  People around you feel you are not acting correctly or are confused.  Shortness of breath or difficulty breathing.  Dizziness and fainting.  You get a rash or develop hives.  You have a decrease in urine output.  Your urine turns a dark color or changes to pink, red, or brown. Any of the following symptoms occur over the next 10 days:  You have a temperature by mouth above 102 F (38.9 C), not controlled by medicine.  Shortness of breath.  Weakness after normal activity.  The white part of the eye turns yellow (jaundice).  You have a decrease in the amount of urine or are urinating less often.  Your urine turns a dark color or changes to pink, red, or brown. Document Released: 01/11/2000 Document Revised: 04/07/2011 Document Reviewed: 08/30/2007 [facility] Patient Information 2014 [place], [place].  _______________________________________________________________________

## 2013-07-14 NOTE — Pre-Procedure Instructions (Signed)
07-14-13 EKG/ CXR done today.

## 2013-07-19 ENCOUNTER — Ambulatory Visit (INDEPENDENT_AMBULATORY_CARE_PROVIDER_SITE_OTHER): Payer: Medicare Other | Admitting: Gastroenterology

## 2013-07-19 ENCOUNTER — Encounter: Payer: Self-pay | Admitting: Gastroenterology

## 2013-07-19 VITALS — BP 134/80 | HR 119 | Ht 63.0 in | Wt 251.0 lb

## 2013-07-19 DIAGNOSIS — R1032 Left lower quadrant pain: Secondary | ICD-10-CM

## 2013-07-19 DIAGNOSIS — Z860100 Personal history of colon polyps, unspecified: Secondary | ICD-10-CM

## 2013-07-19 NOTE — Patient Instructions (Addendum)
Please start taking citrucel (orange flavored) powder fiber supplement.  This may cause some bloating at first but that usually goes away. Begin with a small spoonful and work your way up to a large, heaping spoonful daily over a week. You will be set up for a colonoscopy in mid to late August to give you plenty of time to recover from your upcoming gynecologic surgery. (for polyp surveillance).

## 2013-07-19 NOTE — Progress Notes (Signed)
HPI: This is a  very pleasant 68 year old woman whom I am meeting for the first time today.   She is here to discuss repeat colonoscopy for surveillance.  She has been also having LLQ pains.  CT see below;   she met with her gynecologist last week and is set for laparoscopic surgical resection of left sided pelvic mass next week.  Bowels are "crazy, crazy" feels like she has a blockage.  She describes alternating constipation and loose stools.  No GI bleeding.  No fevers or chills.    Colonoscopy July 2005, Dr. Acquanetta Sit, done for screening.  3 subcentimeter polyps were removed, these were adenomatous on biopsy. Also left-sided diverticulosis was noted Colonoscopy pathology report from 2010 showed one hyperplastic polyp and one tubular adenoma. I cannot find the actual colonoscopy report in the hospital computer system here.  She tells me [facility]    CT scan of the abdomen and pelvis collected on 05/27/2013 demonstrates the presence of colonic diverticuli without surrounding inflammatory changes to suggest acute diverticulitis there is no significant ascites. The right ovary is atrophic. Left ovary appears slightly prominent measuring approximately 4.2 x 2.7 x 3.5 cm. CA 125 returned to value of 7. ADDENDUM: Dr. Garwin Brothers office contacted me and gave the history that the left ovary and the right ovary have both been removed in the past. There is now a lobular soft tissue mass present within the left adnexae which was described on the dictated report from 05/27/2013. This soft tissue mass does appear to be somewhat low in attenuation and could simply represent a collection of cysts. Comparison with the outside ultrasound is recommended. In a postmenopausal female, this new soft tissue mass is somewhat suspicious, and surgical consultation is recommended. If further imaging is required, an MRI of the pelvis would be recommended.  Labs 06/2013 normal CBC, normal  CMET    Review of systems: Pertinent positive and negative review of systems were noted in the above HPI section. Complete review of systems was performed and was otherwise normal.    Past Medical History  Diagnosis Date  . Murmur, heart   . Hyperlipidemia   . Hypertension   . Colon polyps     benign- last colonoscopy 09-2008  . H/O seasonal allergies     no problems now  . Arthritis     arthritis -knees    Past Surgical History  Procedure Laterality Date  . Abdominal hysterectomy    . Cholecystectomy      open-gallstones  . Appendectomy      with gallbladder surgery  . Oophorectomy      ovarian cyst  . Bunionectomy Bilateral     Current Outpatient Prescriptions  Medication Sig Dispense Refill  . BIOTIN PO Take 1 tablet by mouth daily.      . Cholecalciferol (VITAMIN D-3) 1000 UNITS CAPS Take 1,000 Units by mouth daily.       Marland Kitchen diltiazem (CARDIZEM) 120 MG tablet Take 120 mg by mouth 3 (three) times daily.      . hydrochlorothiazide (HYDRODIURIL) 25 MG tablet Take 25 mg by mouth every morning.      Marland Kitchen lisinopril (PRINIVIL,ZESTRIL) 20 MG tablet Take 2 tablets (40 mg total) by mouth every morning.  90 tablet  1  . naproxen (NAPROSYN) 500 MG tablet Take 500 mg by mouth 2 (two) times daily as needed for mild pain.      Marland Kitchen tetrahydrozoline 0.05 % ophthalmic solution  Place 1 drop into both eyes as needed (eye allergies).      . vitamin B-12 (CYANOCOBALAMIN) 1000 MCG tablet Take 1,000 mcg by mouth daily.       No current facility-administered medications for this visit.    Allergies as of 07/19/2013  . (No Known Allergies)    Family History  Problem Relation Age of Onset  . Colon cancer Mother     History   Social History  . Marital Status: Married    Spouse Name: N/A    Number of Children: N/A  . Years of Education: N/A   Occupational History  . Not on file.   Social History Main Topics  . Smoking status: Never Smoker   . Smokeless tobacco: Never Used  .  Alcohol Use: No  . Drug Use: No  . Sexual Activity: Yes   Other Topics Concern  . Not on file   Social History Narrative  . No narrative on file       Physical Exam: BP 134/80  Pulse 119  Ht $R'5\' 3"'mw$  (1.6 m)  Wt 251 lb (113.853 kg)  BMI 44.47 kg/m2 Constitutional: generally well-appearing Psychiatric: alert and oriented x3 Eyes: extraocular movements intact Mouth: oral pharynx moist, no lesions Neck: supple no lymphadenopathy Cardiovascular: heart regular rate and rhythm Lungs: clear to auscultation bilaterally Abdomen: soft, nontender, nondistended, no obvious ascites, no peritoneal signs, normal bowel sounds Extremities: no lower extremity edema bilaterally Skin: no lesions on visible extremities    Assessment and plan: 68 y.o. female with  personal history of adenomatous polyps, left-sided abdominal pain, left sided pelvic mass  It is not clear to me if the pelvic mass is responsible for her left-sided abdominal discomforts. The mass is clearly abnormal and needs to be removed however. That surgery is RE set up for next week. Hopefully her pains will improve after that. She will need surveillance colonoscopy for personal history of precancerous colon polyps. Her last colonoscopy was with Dr. Lyla Son, in 2010. I do not have the report from that colonoscopy however I do have the pathologic report. We will plan for colonoscopy in 6-8 weeks to allow her time to heal from her gynecologic surgery planned for next week. She has alternating bowel habits and I have recommended she try fiber supplements to see if that helps.

## 2013-07-22 NOTE — Telephone Encounter (Signed)
No return call 

## 2013-07-26 ENCOUNTER — Inpatient Hospital Stay (HOSPITAL_COMMUNITY): Payer: Medicare Other | Admitting: Anesthesiology

## 2013-07-26 ENCOUNTER — Telehealth: Payer: Self-pay | Admitting: Gynecologic Oncology

## 2013-07-26 ENCOUNTER — Encounter (HOSPITAL_COMMUNITY): Payer: Self-pay | Admitting: *Deleted

## 2013-07-26 ENCOUNTER — Encounter (HOSPITAL_COMMUNITY)
Admission: RE | Disposition: A | Discharge status: Home / Self Care | Payer: Self-pay | Source: Ambulatory Visit | Attending: Obstetrics & Gynecology

## 2013-07-26 ENCOUNTER — Ambulatory Visit (HOSPITAL_COMMUNITY)
Admission: RE | Admit: 2013-07-26 | Discharge: 2013-07-26 | Disposition: A | Discharge status: Home / Self Care | Payer: Medicare Other | Source: Ambulatory Visit | Attending: Obstetrics & Gynecology | Admitting: Obstetrics & Gynecology

## 2013-07-26 ENCOUNTER — Encounter (HOSPITAL_COMMUNITY): Payer: Medicare Other | Admitting: Anesthesiology

## 2013-07-26 DIAGNOSIS — M25559 Pain in unspecified hip: Secondary | ICD-10-CM

## 2013-07-26 DIAGNOSIS — R1900 Intra-abdominal and pelvic swelling, mass and lump, unspecified site: Secondary | ICD-10-CM

## 2013-07-26 LAB — TYPE AND SCREEN
ABO/RH(D): B POS
Antibody Screen: NEGATIVE

## 2013-07-26 LAB — ABO/RH: ABO/RH(D): B POS

## 2013-07-26 SURGERY — ROBOTIC ASSISTED SALPINGO OOPHORECTOMY
Anesthesia: General | Laterality: Right

## 2013-07-26 MED ORDER — LACTATED RINGERS IV SOLN
INTRAVENOUS | Status: DC
  Administered 2013-07-26 (×2): via INTRAVENOUS

## 2013-07-26 MED ORDER — LABETALOL HCL 5 MG/ML IV SOLN
INTRAVENOUS | Status: AC
  Filled 2013-07-26: qty 4

## 2013-07-26 MED ORDER — PROPOFOL 10 MG/ML IV BOLUS
INTRAVENOUS | Status: AC
  Filled 2013-07-26: qty 20

## 2013-07-26 MED ORDER — PROPOFOL 10 MG/ML IV BOLUS
INTRAVENOUS | Status: DC | PRN
  Administered 2013-07-26: 150 mg via INTRAVENOUS

## 2013-07-26 MED ORDER — ROCURONIUM BROMIDE 100 MG/10ML IV SOLN
INTRAVENOUS | Status: DC | PRN
  Administered 2013-07-26: 50 mg via INTRAVENOUS

## 2013-07-26 MED ORDER — CEFAZOLIN SODIUM-DEXTROSE 2-3 GM-% IV SOLR
INTRAVENOUS | Status: AC
  Filled 2013-07-26: qty 50

## 2013-07-26 MED ORDER — ROCURONIUM BROMIDE 100 MG/10ML IV SOLN
INTRAVENOUS | Status: AC
  Filled 2013-07-26: qty 1

## 2013-07-26 MED ORDER — ONDANSETRON HCL 4 MG/2ML IJ SOLN
INTRAMUSCULAR | Status: AC
  Filled 2013-07-26: qty 2

## 2013-07-26 MED ORDER — SODIUM CHLORIDE 0.9 % IJ SOLN
INTRAMUSCULAR | Status: AC
  Filled 2013-07-26: qty 10

## 2013-07-26 MED ORDER — NEOSTIGMINE METHYLSULFATE 10 MG/10ML IV SOLN
INTRAVENOUS | Status: DC | PRN
  Administered 2013-07-26: 4 mg via INTRAVENOUS

## 2013-07-26 MED ORDER — EPHEDRINE SULFATE 50 MG/ML IJ SOLN
INTRAMUSCULAR | Status: AC
  Filled 2013-07-26: qty 1

## 2013-07-26 MED ORDER — LIDOCAINE HCL (CARDIAC) 20 MG/ML IV SOLN
INTRAVENOUS | Status: DC | PRN
  Administered 2013-07-26: 50 mg via INTRAVENOUS

## 2013-07-26 MED ORDER — FENTANYL CITRATE 0.05 MG/ML IJ SOLN
INTRAMUSCULAR | Status: DC | PRN
  Administered 2013-07-26: 100 ug via INTRAVENOUS

## 2013-07-26 MED ORDER — CEFAZOLIN SODIUM-DEXTROSE 2-3 GM-% IV SOLR
2.0000 g | INTRAVENOUS | Status: AC
  Administered 2013-07-26: 2 g via INTRAVENOUS

## 2013-07-26 MED ORDER — FENTANYL CITRATE 0.05 MG/ML IJ SOLN
INTRAMUSCULAR | Status: AC
  Filled 2013-07-26: qty 5

## 2013-07-26 MED ORDER — GLYCOPYRROLATE 0.2 MG/ML IJ SOLN
INTRAMUSCULAR | Status: DC | PRN
  Administered 2013-07-26: .8 mg via INTRAVENOUS

## 2013-07-26 MED ORDER — LABETALOL HCL 5 MG/ML IV SOLN
INTRAVENOUS | Status: DC | PRN
  Administered 2013-07-26: 5 mg via INTRAVENOUS

## 2013-07-26 MED ORDER — EPHEDRINE SULFATE 50 MG/ML IJ SOLN
INTRAMUSCULAR | Status: DC | PRN
  Administered 2013-07-26: 5 mg via INTRAVENOUS

## 2013-07-26 MED ORDER — HEPARIN SODIUM (PORCINE) 1000 UNIT/ML IJ SOLN
INTRAMUSCULAR | Status: AC
  Filled 2013-07-26: qty 1

## 2013-07-26 NOTE — Discharge Instructions (Signed)
General Anesthesia, Adult General anesthesia is a sleep-like state of non-feeling produced by medicines (anesthetics). General anesthesia prevents you from being alert and feeling pain during a medical procedure. Your caregiver may recommend general anesthesia if your procedure:  Is long.  Is painful or uncomfortable.  Would be frightening to see or hear.  Requires you to be still.  Affects your breathing.  Causes significant blood loss. LET YOUR CAREGIVER KNOW ABOUT:  Allergies to food or medicine.  Medicines taken, including vitamins, herbs, eyedrops, over-the-counter medicines, and creams.  Use of steroids (by mouth or creams).  Previous problems with anesthetics or numbing medicines, including problems experienced by relatives.  History of bleeding problems or blood clots.  Previous surgeries and types of anesthetics received.  Possibility of pregnancy, if this applies.  Use of cigarettes, alcohol, or illegal drugs.  Any health condition(s), especially diabetes, sleep apnea, and high blood pressure. RISKS AND COMPLICATIONS General anesthesia rarely causes complications. However, if complications do occur, they can be life-threatening. Complications include:  A lung infection.  A stroke.  A heart attack.  Waking up during the procedure. When this occurs, the patient may be unable to move and communicate that he or she is awake. The patient may feel severe pain. Older adults and adults with serious medical problems are more likely to have complications than adults who are young and healthy. Some complications can be prevented by answering all of your caregiver's questions thoroughly and by following all pre-procedure instructions. It is important to tell your caregiver if any of the pre-procedure instructions, especially those related to diet, were not followed. Any food or liquid in the stomach can cause problems when you are under general anesthesia. BEFORE THE  PROCEDURE  Ask your caregiver if you will have to spend the night at the hospital. If you will not have to spend the night, arrange to have an adult drive you and stay with you for 24-hours.  Follow your caregiver's instructions if you are taking dietary supplements or medicines. Your caregiver may tell you to stop taking them or to reduce your dosage.  Do not smoke for as long as possible before your procedure. If possible, stop smoking 3-6 weeks before the procedure.  Do not take new dietary supplements or medicines within 1 week of your procedure unless your caregiver approves them.  Do not eat within 8 hours of your procedure or as directed by your caregiver. Drink only clear liquids, such as water, black coffee (without milk or cream), and fruit juices (without pulp).  Do not drink within 3 hours of your procedure or as directed by your caregiver.  You may brush your teeth on the morning of the procedure, but make sure to spit out the toothpaste and water when finished. PROCEDURE  You will receive anesthetics through a mask, through an intravenous (IV) access tube, or through both. A doctor who specializes in anesthesia (anesthesiologist) or a nurse who specializes in anesthesia (nurse anesthetist) or both will stay with you throughout the procedure to make sure you remain unconscious. He or she will also watch your blood pressure, pulse, and oxygen levels to make sure that the anesthetics do not cause any problems. Once you are asleep, a breathing tube or mask may be used to help you breathe. AFTER THE PROCEDURE You will wake up after the procedure is complete. You may be in the room where the procedure was performed or in a recovery area. You may have a sore throat if a  breathing tube was used. You may also feel:  Dizzy.  Weak.  Drowsy.  Confused.  Nauseous.  Cold. These are all normal responses and can be expected to last for up to 24 hours after the procedure is complete. A  caregiver will tell you when you are ready to go home. This will usually be when you are fully awake and in stable condition. Document Released: 04/22/2007 Document Revised: 12/31/2011 Document Reviewed: 05/14/2011 [facility] Patient Information 2015 [place], [place]. This information is not intended to replace advice given to you by your health care provider. Make sure you discuss any questions you have with your health care provider.

## 2013-07-26 NOTE — Progress Notes (Signed)
Patient and family informed that surgery is delayed. They verbalize understanding. Emotional support provided.

## 2013-07-26 NOTE — Transfer of Care (Signed)
Immediate Anesthesia Transfer of Care Note  Patient: [name]  Procedure(s) Performed: Procedure(s) (LRB): ROBOTIC ASSISTED LEFT SALPINGO OOPHORECTOMY (Left) RIGHT OOPHORECTOMY (Right)  Patient Location: PACU  Anesthesia Type: General  Level of Consciousness: sedated, patient cooperative and responds to stimulation  Airway & Oxygen Therapy: Patient Spontanous Breathing and Patient connected to face mask oxgen  Post-op Assessment: Report given to PACU RN and Post -op Vital signs reviewed and stable  Post vital signs: Reviewed and stable  Complications: No apparent anesthesia complications

## 2013-07-26 NOTE — H&P (View-Only) (Signed)
Consult Note: Gyn-Onc  Consult was requested by Dr. Elly Modena for the evaluation of [name] 68 y.o. female  CC: Pelvic mass pelvic pain  Assessment/Plan:  [name] is a 68 year old status post prior hysterectomy left salpingo-oophorectomy and right salpingectomy done completed in numerous phases. She now presents with persistent left pelvic fullness and discomfort over the last 2-3 years. Imaging is notable for the presence of a left adnexal mass. CA 125 is within normal limits. She is aware that the imaging findings may be the result of adhesions based on the multiple abdominal procedures and diverticulosis. The plan is for minimally invasive approach with the plan to perform left salpingo-oophorectomy and right oophorectomy based upon the accessibility of the adnexa. She is aware that even with laparotomy and exploration the adnexa may not be valuable if in case in dense adhesions because at that time a risk benefit analysis would have to be performed. Resumption of surgery discussed with the patient her inclusive of infection bleeding damage to other structures prolonged hospitalization and reoperation. The procedure will occur on 07/26/2013  HPI:  [name]  is a 68 y.o.  gravida 4 para 3 last normal menstrual period in the early 1980s. Patient subsequently underwent vaginal hysterectomy she says for sterilization. She subsequently underwent a right salpingectomy in the 1990s for a symptomatic right tubal cyst. In 2004 she presented with complaints of pelvic pain. She underwent a laparoscopy and that was challenged by numerous intra-abdominal adhesions requiring laparotomy. The procedure performed was lysis of adhesions left salpingo-oophorectomy and right ovarian cystectomy. The pathology from that procedure is notable for left hydrosalpinx a benign hemorrhagic left ovarian cyst and a benign right ovarian follicular cyst.  [name] did well until approximately 2-3 years ago. She  presents with complaints of left pelvic fullness worse over the last 6 months. The pain is localized to the left lower quadrant and radiates 5/10. She denies any associated nausea or vomiting, diarrhea febrile episodes or rectal bleeding. The pain is self-limiting and there are no aggravating factors. She reports good appetite stable weight no dominant bloating.   A CT scan of the abdomen and pelvis collected on 05/27/2013 demonstrates the presence of colonic diverticuli without surrounding inflammatory changes to suggest acute diverticulitis there is no significant ascites. The right ovary is atrophic. Left ovary appears slightly prominent measuring approximately 4.2 x 2.7 x 3.5 cm. CA 125 returned to value of 7.  Family history is notable for mother with colon cancer diagnosed in her 86s. The patient's last colonoscopy was in September 2010 at which polyps were removed.   Current Meds:  Outpatient Encounter Prescriptions as of 07/05/2013  Medication Sig  . Cholecalciferol (VITAMIN D-3) 1000 UNITS CAPS Take by mouth.  . diltiazem (CARDIZEM) 120 MG tablet TAKE ONE TABLET BY MOUTH THREE TIMES DAILY  . hydrochlorothiazide (HYDRODIURIL) 25 MG tablet TAKE ONE TABLET BY MOUTH ONCE DAILY  . lisinopril (PRINIVIL,ZESTRIL) 20 MG tablet TAKE TWO TABLETS BY MOUTH ONCE DAILY  . naproxen (NAPROSYN) 500 MG tablet TAKE ONE TABLET BY MOUTH TWICE DAILY AS NEEDED ARTHRITIS  . NON FORMULARY Take 1 each by mouth daily.  . vitamin B-12 (CYANOCOBALAMIN) 1000 MCG tablet Take 1,000 mcg by mouth daily.    Allergy: No Known Allergies  Social Hx:   History   Social History  . Marital Status: Married    Spouse Name: N/A    Number of Children: N/A  . Years of Education: N/A  Occupational History  . Not on file.   Social History Main Topics  . Smoking status: Never Smoker   . Smokeless tobacco: Never Used  . Alcohol Use: No  . Drug Use: No  . Sexual Activity: Not on file   Other Topics Concern  . Not on  file   Social History Narrative  . No narrative on file    Past Surgical Hx:  Past Surgical History  Procedure Laterality Date  . Abdominal hysterectomy      Past Medical Hx:  Past Medical History  Diagnosis Date  . Murmur, heart   . Hyperlipidemia   . Hypertension   . Arthritis   . Colon polyps     Past Gynecological History:  Gravida 4 para 3 last Pap test 3-4 years ago within normal limits menarche occurred age of 62 with regular menses. Reports oral contraceptive pill use for 10 years. Reports hysterectomy for sterilization. No LMP recorded. Patient has had a hysterectomy.  Family Hx: No family history on file. Mother colon cancer Dx in her 45's  Review of Systems:  Constitutional  Feels well, no recent febrile episodes Cardiovascular  No chest pain, shortness of breath, or edema  Pulmonary  No cough or wheeze.  Gastro Intestinal  No nausea, vomitting, or diarrhoea. No bright red blood per rectum, no abdominal pain, change in bowel movement, or constipation. Genito Urinary  No frequency, urgency, dysuria, no vaginal bleeding Musculo Skeletal  No myalgia, arthralgia, joint swelling or pain  Neurologic  No weakness, numbness, change in gait,    Vitals:  Blood pressure 172/94, pulse 71, temperature 98.1 F (36.7 C), temperature source Oral, resp. rate 20, height 5' 3.54" (1.614 m), weight 250 lb 9.6 oz (113.671 kg).  Physical Exam: WD in NAD Neck  Supple NROM, without any enlargements.  Lymph Node Survey No cervical supraclavicular or inguinal adenopathy Cardiovascular  Pulse normal rate, regularity and rhythm. S1 and S2 normal.  Lungs  Clear to auscultation bilateraly  Psychiatry  Alert and oriented to person, appropriate mood speech and affect. Abdomen  Normoactive bowel sounds, abdomen soft, non-tender and obese. Surgical  sites intact without evidence of hernia.  Back No CVA tenderness Genito Urinary  Vulva/vagina: Normal external female genitalia.   No lesions. No discharge or bleeding.  Bladder/urethra:  No lesions or masses  Vagina: atrophc no lesions  Adnexa: No palpable masses, no cul de sac nodularity. Rectal  Good tone, no masses no cul de sac nodularity.  Extremities  No bilateral cyanosis, clubbing or edema.   Janie Morning, MD, PhD 07/05/2013, 5:51 PM

## 2013-07-26 NOTE — Interval H&P Note (Signed)
History and Physical Interval Note:  07/26/2013 4:40 PM  [name]  has presented today for surgery, with the diagnosis of PELVIC MASS/PELVIC PAIN  The various methods of treatment have been discussed with the patient and family. After consideration of risks, benefits and other options for treatment, the patient has consented to  Procedure(s): ROBOTIC ASSISTED LEFT SALPINGO OOPHORECTOMY (Left) RIGHT OOPHORECTOMY (Right) as a surgical intervention .  The patient's history has been reviewed, patient examined, no change in status, stable for surgery.  I have reviewed the patient's chart and labs.  Questions were answered to the patient's satisfaction.     [place], [facility]

## 2013-07-26 NOTE — Anesthesia Preprocedure Evaluation (Addendum)
Anesthesia Evaluation  Patient identified by MRN, date of birth, ID band Patient awake    Reviewed: Allergy & Precautions, H&P , NPO status , Patient's Chart, lab work & pertinent test results  Airway Mallampati: II TM Distance: >3 FB Neck ROM: Full    Dental  (+) Edentulous Upper, Dental Advisory Given   Pulmonary neg pulmonary ROS,    Pulmonary exam normal       Cardiovascular hypertension, Pt. on medications Rhythm:Regular Rate:Normal     Neuro/Psych negative neurological ROS  negative psych ROS   GI/Hepatic negative GI ROS, Neg liver ROS,   Endo/Other  Morbid obesity  Renal/GU negative Renal ROS  negative genitourinary   Musculoskeletal negative musculoskeletal ROS (+)   Abdominal   Peds  Hematology negative hematology ROS (+)   Anesthesia Other Findings   Reproductive/Obstetrics negative OB ROS                         Anesthesia Physical Anesthesia Plan  ASA: II  Anesthesia Plan: General   Post-op Pain Management:    Induction: Intravenous  Airway Management Planned: Oral ETT  Additional Equipment:   Intra-op Plan:   Post-operative Plan: Extubation in OR  Informed Consent: I have reviewed the patients History and Physical, chart, labs and discussed the procedure including the risks, benefits and alternatives for the proposed anesthesia with the patient or authorized representative who has indicated his/her understanding and acceptance.   Dental advisory given  Plan Discussed with: CRNA  Anesthesia Plan Comments:         Anesthesia Quick Evaluation

## 2013-07-27 ENCOUNTER — Telehealth: Payer: Self-pay | Admitting: *Deleted
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
## 2013-07-27 NOTE — Anesthesia Postprocedure Evaluation (Signed)
Anesthesia Post Note  Patient: [name]  Procedure(s) Performed: Procedure(s) (LRB): none performed  (N/A)  Anesthesia type: General  Patient location: PACU  Post pain: Pain level controlled  Post assessment: Post-op Vital signs reviewed  Last Vitals:  Filed Vitals:   07/26/13 2000  BP: 139/80  Pulse: 64  Temp: 36.2 C  Resp: 17    Post vital signs: Reviewed  Level of consciousness: sedated  Complications: No apparent anesthesia complications

## 2013-08-02 ENCOUNTER — Telehealth: Payer: Self-pay | Admitting: *Deleted

## 2013-08-02 NOTE — Telephone Encounter (Signed)
Pre-op labs chest xray faxed to sandra at [facility] for pt surgery July8

## 2013-08-03 DIAGNOSIS — Z79899 Other long term (current) drug therapy: Secondary | ICD-10-CM | POA: Diagnosis not present

## 2013-08-03 DIAGNOSIS — K660 Peritoneal adhesions (postprocedural) (postinfection): Secondary | ICD-10-CM | POA: Diagnosis not present

## 2013-08-03 DIAGNOSIS — D287 Benign neoplasm of other specified female genital organs: Secondary | ICD-10-CM | POA: Diagnosis not present

## 2013-08-03 DIAGNOSIS — I10 Essential (primary) hypertension: Secondary | ICD-10-CM | POA: Diagnosis present

## 2013-08-03 DIAGNOSIS — N7013 Chronic salpingitis and oophoritis: Secondary | ICD-10-CM | POA: Diagnosis not present

## 2013-08-03 DIAGNOSIS — Z4889 Encounter for other specified surgical aftercare: Secondary | ICD-10-CM | POA: Insufficient documentation

## 2013-08-03 DIAGNOSIS — Z6841 Body Mass Index (BMI) 40.0 and over, adult: Secondary | ICD-10-CM | POA: Diagnosis not present

## 2013-08-03 DIAGNOSIS — R1900 Intra-abdominal and pelvic swelling, mass and lump, unspecified site: Secondary | ICD-10-CM | POA: Diagnosis not present

## 2013-08-03 DIAGNOSIS — M129 Arthropathy, unspecified: Secondary | ICD-10-CM | POA: Diagnosis present

## 2013-08-03 DIAGNOSIS — Z86010 Personal history of colonic polyps: Secondary | ICD-10-CM | POA: Diagnosis not present

## 2013-08-03 DIAGNOSIS — Z5331 Laparoscopic surgical procedure converted to open procedure: Secondary | ICD-10-CM | POA: Diagnosis not present

## 2013-08-03 HISTORY — PX: EXCISION OF ADNEXAL MASS: SHX5820

## 2013-08-04 NOTE — Telephone Encounter (Signed)
error 

## 2013-08-14 DIAGNOSIS — A498 Other bacterial infections of unspecified site: Secondary | ICD-10-CM | POA: Diagnosis not present

## 2013-08-14 DIAGNOSIS — I10 Essential (primary) hypertension: Secondary | ICD-10-CM | POA: Diagnosis not present

## 2013-08-14 DIAGNOSIS — K56609 Unspecified intestinal obstruction, unspecified as to partial versus complete obstruction: Secondary | ICD-10-CM | POA: Diagnosis not present

## 2013-08-14 DIAGNOSIS — R143 Flatulence: Secondary | ICD-10-CM | POA: Diagnosis not present

## 2013-08-14 DIAGNOSIS — K565 Intestinal adhesions [bands], unspecified as to partial versus complete obstruction: Secondary | ICD-10-CM | POA: Diagnosis not present

## 2013-08-14 DIAGNOSIS — N390 Urinary tract infection, site not specified: Secondary | ICD-10-CM | POA: Diagnosis not present

## 2013-08-14 DIAGNOSIS — K838 Other specified diseases of biliary tract: Secondary | ICD-10-CM | POA: Diagnosis not present

## 2013-08-14 DIAGNOSIS — R188 Other ascites: Secondary | ICD-10-CM | POA: Diagnosis not present

## 2013-08-14 DIAGNOSIS — R141 Gas pain: Secondary | ICD-10-CM | POA: Diagnosis not present

## 2013-08-14 DIAGNOSIS — K6389 Other specified diseases of intestine: Secondary | ICD-10-CM | POA: Diagnosis not present

## 2013-08-17 DIAGNOSIS — K56609 Unspecified intestinal obstruction, unspecified as to partial versus complete obstruction: Secondary | ICD-10-CM | POA: Insufficient documentation

## 2013-09-23 DIAGNOSIS — M129 Arthropathy, unspecified: Secondary | ICD-10-CM | POA: Insufficient documentation

## 2013-09-23 DIAGNOSIS — D126 Benign neoplasm of colon, unspecified: Secondary | ICD-10-CM | POA: Insufficient documentation

## 2013-09-23 DIAGNOSIS — I10 Essential (primary) hypertension: Secondary | ICD-10-CM | POA: Insufficient documentation

## 2013-09-23 DIAGNOSIS — N83209 Unspecified ovarian cyst, unspecified side: Secondary | ICD-10-CM | POA: Diagnosis not present

## 2013-10-12 ENCOUNTER — Encounter: Payer: Self-pay | Admitting: Gastroenterology

## 2013-10-17 ENCOUNTER — Ambulatory Visit (INDEPENDENT_AMBULATORY_CARE_PROVIDER_SITE_OTHER): Payer: Medicare Other | Admitting: Family Medicine

## 2013-10-17 ENCOUNTER — Encounter: Payer: Self-pay | Admitting: Family Medicine

## 2013-10-17 VITALS — BP 134/86 | HR 76 | Temp 98.0°F | Resp 18 | Ht 63.5 in | Wt 247.0 lb

## 2013-10-17 DIAGNOSIS — E785 Hyperlipidemia, unspecified: Secondary | ICD-10-CM | POA: Diagnosis not present

## 2013-10-17 DIAGNOSIS — Z0000 Encounter for general adult medical examination without abnormal findings: Secondary | ICD-10-CM | POA: Diagnosis not present

## 2013-10-17 DIAGNOSIS — L723 Sebaceous cyst: Secondary | ICD-10-CM

## 2013-10-17 LAB — LIPID PANEL
CHOL/HDL RATIO: 3.8 ratio
CHOLESTEROL: 178 mg/dL (ref 0–200)
HDL: 47 mg/dL (ref >39)
LDL CALC: 109 mg/dL — AB (ref 0–99)
Triglycerides: 112 mg/dL (ref <150)
VLDL: 22 mg/dL (ref 0–40)

## 2013-10-17 LAB — TSH: TSH: 2.142 u[IU]/mL (ref 0.350–4.500)

## 2013-10-17 NOTE — Progress Notes (Signed)
Subjective:    Patient ID: [name], female    DOB: 04/28/1945, 68 y.o.   MRN: [id number]  HPI Subjective:   Patient presents for Medicare Annual/Subsequent preventive examination.  Patient had an oopherectomy an ovarian cyst this summer. Since that time her left lower quadrant abdominal pain has resolved. Her colonoscopy is scheduled for November. Patient reports that she had a mammogram in January although I can find no records of it. Patient states that the mammogram was normal. She is a history of a hysterectomy and therefore does not require Pap smears. I reviewed her lab work from hospitalization in gene. At that time her CMP and CBC were normal. She is due for a fasting lipid panel as well as a TSH. She is also due for Prevnar 13, a flu shot, tetanus shot, and Zostavax. She declines all the shots today. She does have a large sebaceous cyst on the posterior aspect of her right shoulder. It is approximately 5 cm in diameter. Review Past Medical/Family/Social: Past Medical History  Diagnosis Date  . Murmur, heart   . Hyperlipidemia   . Hypertension   . Colon polyps     benign- last colonoscopy 09-2008  . H/O seasonal allergies     no problems now  . Arthritis     arthritis -knees   Past Surgical History  Procedure Laterality Date  . Abdominal hysterectomy    . Cholecystectomy      open-gallstones  . Appendectomy      with gallbladder surgery  . Oophorectomy      ovarian cyst  . Bunionectomy Bilateral    Current Outpatient Prescriptions on File Prior to Visit  Medication Sig Dispense Refill  . BIOTIN PO Take 1 tablet by mouth daily.      . Cholecalciferol (VITAMIN D-3) 1000 UNITS CAPS Take 1,000 Units by mouth daily.       Marland Kitchen diltiazem (CARDIZEM) 120 MG tablet Take 120 mg by mouth 3 (three) times daily.      . hydrochlorothiazide (HYDRODIURIL) 25 MG tablet Take 25 mg by mouth every morning.      Marland Kitchen lisinopril (PRINIVIL,ZESTRIL) 20 MG tablet Take 2 tablets (40 mg total) by  mouth every morning.  90 tablet  1  . naproxen (NAPROSYN) 500 MG tablet Take 500 mg by mouth 2 (two) times daily as needed for mild pain.      Marland Kitchen tetrahydrozoline 0.05 % ophthalmic solution Place 1 drop into both eyes as needed (eye allergies).      . vitamin B-12 (CYANOCOBALAMIN) 1000 MCG tablet Take 1,000 mcg by mouth daily.       No current facility-administered medications on file prior to visit.   No Known Allergies History   Social History  . Marital Status: Married    Spouse Name: N/A    Number of Children: N/A  . Years of Education: N/A   Occupational History  . Not on file.   Social History Main Topics  . Smoking status: Never Smoker   . Smokeless tobacco: Never Used  . Alcohol Use: No  . Drug Use: No  . Sexual Activity: Yes   Other Topics Concern  . Not on file   Social History Narrative  . No narrative on file   Family History  Problem Relation Age of Onset  . Colon cancer Mother     Depression Screen  (Note: if answer to either of the following is "Yes", a more complete depression screening is indicated)  Over  the past two weeks, have you felt down, depressed or hopeless? No Over the past two weeks, have you felt little interest or pleasure in doing things? No Have you lost interest or pleasure in daily life? No Do you often feel hopeless? No Do you cry easily over simple problems? No   Activities of Daily Living  In your present state of health, do you have any difficulty performing the following activities?:  Driving? No  Managing money? No  Feeding yourself? No  Getting from bed to chair? No  Climbing a flight of stairs? No  Preparing food and eating?: No  Bathing or showering? No  Getting dressed: No  Getting to the toilet? No  Using the toilet:No  Moving around from place to place: No  In the past year have you fallen or had a near fall?:No  Are you sexually active? No  Do you have more than one partner? No   Hearing Difficulties: No  Do  you often ask people to speak up or repeat themselves? No  Do you experience ringing or noises in your ears? No Do you have difficulty understanding soft or whispered voices? No  Do you feel that you have a problem with memory? No Do you often misplace items? No  Do you feel safe at home? Yes  Cognitive Testing  Alert? Yes Normal Appearance?Yes  Oriented to person? Yes Place? Yes  Time? Yes  Recall of three objects? Yes  Can perform simple calculations? Yes  Displays appropriate judgment?Yes  Can read the correct time from a watch face?Yes   Screening Tests / Date Colonoscopy   2010                  Zostavax  Mammogram 1/14 Influenza Vaccine due Tetanus/tdap Pneumoax 23 05/2012  Review of Systems  All other systems reviewed and are negative.      Objective:   Physical Exam  Vitals reviewed. Constitutional: She is oriented to person, place, and time. She appears well-developed and well-nourished. No distress.  HENT:  Head: Normocephalic and atraumatic.  Right Ear: External ear normal.  Left Ear: External ear normal.  Nose: Nose normal.  Mouth/Throat: Oropharynx is clear and moist. No oropharyngeal exudate.  Eyes: Conjunctivae and EOM are normal. Pupils are equal, round, and reactive to light. Right eye exhibits no discharge. Left eye exhibits no discharge. No scleral icterus.  Neck: Normal range of motion. Neck supple. No JVD present. No tracheal deviation present. No thyromegaly present.  Cardiovascular: Normal rate, regular rhythm, normal heart sounds and intact distal pulses.  Exam reveals no gallop and no friction rub.   No murmur heard. Pulmonary/Chest: Effort normal and breath sounds normal. No stridor. No respiratory distress. She has no wheezes. She has no rales. She exhibits no tenderness.  Abdominal: Soft. Bowel sounds are normal. She exhibits no distension and no mass. There is no tenderness. There is no rebound and no guarding.  Musculoskeletal: Normal range of  motion. She exhibits no edema and no tenderness.  Lymphadenopathy:    She has no cervical adenopathy.  Neurological: She is alert and oriented to person, place, and time. She has normal reflexes. She displays normal reflexes. No cranial nerve deficit. She exhibits normal muscle tone. Coordination normal.  Skin: Skin is warm. No rash noted. She is not diaphoretic. No erythema. No pallor.  Psychiatric: She has a normal mood and affect. Her behavior is normal. Judgment and thought content normal.  Assessment & Plan:  Routine general medical examination at a health care facility  patient's physical exam is normal. I will check a TSH as well as a fasting lipid panel. Cancer screening is otherwise up to date. I recommended a tetanus shot, flu shot, shingle shot, and Prevnar 13. Patient declined the shots to date. I will gladly consult general surgery for an excision of a large sebaceous cyst on her right shoulder. Otherwise her preventive care is up to date. Medicare Attestation  I have personally reviewed:  The patient's medical and social history  Their use of alcohol, tobacco or illicit drugs  Their current medications and supplements  The patient's functional ability including ADLs,fall risks, home safety risks, cognitive, and hearing and visual impairment  Diet and physical activities  Evidence for depression or mood disorders  The patient's weight, height, BMI, and visual acuity have been recorded in the chart. I have made referrals, counseling, and provided education to the patient based on review of the above and I have provided the patient with a written personalized care plan for preventive services.

## 2013-10-18 ENCOUNTER — Encounter: Payer: Self-pay | Admitting: *Deleted

## 2013-10-19 ENCOUNTER — Other Ambulatory Visit: Payer: Self-pay | Admitting: Family Medicine

## 2013-10-19 NOTE — Telephone Encounter (Signed)
Medication refilled per protocol. 

## 2013-10-27 DIAGNOSIS — L02212 Cutaneous abscess of back [any part, except buttock]: Secondary | ICD-10-CM | POA: Diagnosis not present

## 2013-12-05 ENCOUNTER — Ambulatory Visit (INDEPENDENT_AMBULATORY_CARE_PROVIDER_SITE_OTHER): Payer: Self-pay | Admitting: Surgery

## 2013-12-05 ENCOUNTER — Ambulatory Visit (AMBULATORY_SURGERY_CENTER): Payer: Self-pay | Admitting: *Deleted

## 2013-12-05 VITALS — Ht 63.0 in | Wt 251.0 lb

## 2013-12-05 DIAGNOSIS — L02212 Cutaneous abscess of back [any part, except buttock]: Secondary | ICD-10-CM | POA: Diagnosis not present

## 2013-12-05 DIAGNOSIS — Z86010 Personal history of colonic polyps: Secondary | ICD-10-CM

## 2013-12-05 MED ORDER — MOVIPREP 100 G PO SOLR: ORAL | Status: DC

## 2013-12-05 NOTE — Progress Notes (Signed)
Patient denies any allergies to eggs or soy. Patient denies any problems with anesthesia/sedation. Patient denies any oxygen use at home and does not take any diet/weight loss medications. EMMI education assisgned to patient on colonoscopy, this was explained and instructions given to patient. 

## 2013-12-05 NOTE — H&P (Signed)
[name] 12/05/2013 1:47 PM Location: [facility] Patient #: [id number] DOB: Jun 19, 1945 Married / Language: English / Race: Black or African American Female History of Present Illness [name] Moores A. Rondall Radigan MD; 12/05/2013 2:02 PM) Patient words: reck back abscess  pt doing well minimal drainage. no pain .  The patient is a 68 year old female   Allergies [name], [name]; 12/05/2013 1:47 PM) No Known Drug Allergies 10/27/2013  Medication History (Sonya Bynum, CMA; 12/05/2013 1:47 PM) Norco (5-325MG  Tablet, 1 (one) Tablet Oral four times daily, as needed, Taken starting 10/27/2013) Active.    Vitals (Sonya Bynum CMA; 12/05/2013 1:49 PM) 12/05/2013 1:47 PM Weight: 248 lb Height: 64in Body Surface Area: 2.25 m Body Mass Index: 42.57 kg/m Temp.: 24F(Temporal)  Pulse: 77 (Regular)  BP: 128/78 (Sitting, Left Arm, Standard)     Physical Exam (Duante Arocho A. Jaysun Wessels MD; 12/05/2013 2:03 PM)  General Mental Status-Alert. General Appearance-Consistent with stated age. Hydration-Well hydrated. Voice-Normal.  Integumentary Note: small opening left upper back from previous cyst drainage. no redness 5 mm opening. min discharge expressed.   Musculoskeletal Normal Exam - Left-Upper Extremity Strength Normal and Lower Extremity Strength Normal. Normal Exam - Right-Upper Extremity Strength Normal and Lower Extremity Strength Normal.    Assessment & Plan (Israel Werts A. Martesha Niedermeier MD; 12/05/2013 2:04 PM)  ABSCESS OF BACK, EXCEPT BUTTOCK (682.2  L02.212) Impression: patient doing well. needs excision when able. no active infection at this point. Risk of excision include bleeding, infection, recurrence. Pt agrees to proceed. pt will set up excision when able and told to call office to set up.  Current Plans Schedule for Surgery Pt Education - instructions: discussed with patient and provided information.

## 2013-12-19 ENCOUNTER — Ambulatory Visit (AMBULATORY_SURGERY_CENTER): Payer: Medicare Other | Admitting: Gastroenterology

## 2013-12-19 ENCOUNTER — Encounter: Payer: Self-pay | Admitting: Gastroenterology

## 2013-12-19 VITALS — BP 145/97 | HR 76 | Temp 97.1°F | Resp 16 | Ht 63.0 in | Wt 251.0 lb

## 2013-12-19 DIAGNOSIS — R1032 Left lower quadrant pain: Secondary | ICD-10-CM | POA: Diagnosis not present

## 2013-12-19 DIAGNOSIS — I10 Essential (primary) hypertension: Secondary | ICD-10-CM | POA: Diagnosis not present

## 2013-12-19 DIAGNOSIS — Z86010 Personal history of colonic polyps: Secondary | ICD-10-CM | POA: Diagnosis not present

## 2013-12-19 DIAGNOSIS — K5730 Diverticulosis of large intestine without perforation or abscess without bleeding: Secondary | ICD-10-CM

## 2013-12-19 DIAGNOSIS — Z800 Family history of malignant neoplasm of digestive organs: Secondary | ICD-10-CM | POA: Diagnosis not present

## 2013-12-19 DIAGNOSIS — D123 Benign neoplasm of transverse colon: Secondary | ICD-10-CM

## 2013-12-19 MED ORDER — SODIUM CHLORIDE 0.9 % IV SOLN: 500.0000 mL | INTRAVENOUS | Status: DC

## 2013-12-19 NOTE — Progress Notes (Signed)
Procedure ends, to recovery, report given and VSS. 

## 2013-12-19 NOTE — Progress Notes (Signed)
Called to room to assist during endoscopic procedure.  Patient ID and intended procedure confirmed with present staff. Received instructions for my participation in the procedure from the performing physician.  

## 2013-12-19 NOTE — Progress Notes (Signed)
Notified Holland Falling, CRNA pt's blood pressure was 162/101 left arm and 146/98 right arm when admitted. maw

## 2013-12-19 NOTE — Op Note (Signed)
[place]  [address] [place], [id number]   COLONOSCOPY PROCEDURE REPORT  PATIENT: [name], [name]  MR#: [id number] BIRTHDATE: 12-24-1945 , 68  yrs. old GENDER: female ENDOSCOPIST: Milus Banister, MD PROCEDURE DATE:  12/19/2013 PROCEDURE:   Colonoscopy with snare polypectomy First Screening Colonoscopy - Avg.  risk and is 50 yrs.  old or older - No.  Prior Negative Screening - Now for repeat screening. N/A  History of Adenoma - Now for follow-up colonoscopy & has been > or = to 3 yrs.  Yes hx of adenoma.  Has been 3 or more years since last colonoscopy.  Polyps Removed Today? Yes. ASA CLASS:   Class II INDICATIONS:Colonoscopy July 2005, Dr.  Acquanetta Sit, done for screening.  3 subcentimeter polyps were removed, these were adenomatous on biopsy.  Also left-sided diverticulosis was noted. Colonoscopy pathology report from 2010 showed one hyperplastic polyp and one tubular adenoma.  I cannot find the actual colonoscopy report in the hospital computer system here.  She tells me [facility].  Mother had colon cancer.Marland Kitchen MEDICATIONS: Monitored anesthesia care and Propofol 260 mg IV  DESCRIPTION OF PROCEDURE:   After the risks benefits and alternatives of the procedure were thoroughly explained, informed consent was obtained.  The digital rectal exam revealed no abnormalities of the rectum.   The LB [id number]  endoscope was introduced through the anus and advanced to the cecum, which was identified by both the appendix and ileocecal valve. No adverse events experienced.   The quality of the prep was excellent.  The instrument was then slowly withdrawn as the colon was fully examined.   COLON FINDINGS: Two sessile polyps ranging between 3-29mm in size were found in the transverse colon.  Polypectomies were performed with a cold snare.  The resection was complete, the polyp tissue was completely retrieved and sent to histology.    There was mild diverticulosis noted in the left colon.   The examination was otherwise normal.  Retroflexed views revealed no abnormalities. The time to cecum=3 minutes 35 seconds.  Withdrawal time=8 minutes 21 seconds.  The scope was withdrawn and the procedure completed. COMPLICATIONS: There were no immediate complications.  ENDOSCOPIC IMPRESSION: 1.   Two sessile polyps ranging between 3-80mm in size were found in the transverse colon; polypectomies were performed with a cold snare 2.   Mild diverticulosis was noted in the left colon 3.   The examination was otherwise normal  RECOMMENDATIONS: If the polyp(s) removed today are proven to be adenomatous (pre-cancerous) polyps, you will need a repeat colonoscopy in 5 years. You will receive a letter within 1-2 weeks with the results of your biopsy as well as final recommendations.  Please call my office if you have not received a letter after 3 weeks.  eSigned:  Milus Banister, MD 12/19/2013 8:48 AM   cc: Jenna Luo, MD

## 2013-12-19 NOTE — Patient Instructions (Signed)
Discharge instructions given. Handouts on polyps and diverticulosis. Resume previous medications. YOU HAD AN ENDOSCOPIC PROCEDURE TODAY AT THE [facility]: Refer to the procedure report that was given to you for any specific questions about what was found during the examination.  If the procedure report does not answer your questions, please call your gastroenterologist to clarify.  If you requested that your care partner not be given the details of your procedure findings, then the procedure report has been included in a sealed envelope for you to review at your convenience later.  YOU SHOULD EXPECT: Some feelings of bloating in the abdomen. Passage of more gas than usual.  Walking can help get rid of the air that was put into your GI tract during the procedure and reduce the bloating. If you had a lower endoscopy (such as a colonoscopy or flexible sigmoidoscopy) you may notice spotting of blood in your stool or on the toilet paper. If you underwent a bowel prep for your procedure, then you may not have a normal bowel movement for a few days.  DIET: Your first meal following the procedure should be a light meal and then it is ok to progress to your normal diet.  A half-sandwich or bowl of soup is an example of a good first meal.  Heavy or fried foods are harder to digest and may make you feel nauseous or bloated.  Likewise meals heavy in dairy and vegetables can cause extra gas to form and this can also increase the bloating.  Drink plenty of fluids but you should avoid alcoholic beverages for 24 hours.  ACTIVITY: Your care partner should take you home directly after the procedure.  You should plan to take it easy, moving slowly for the rest of the day.  You can resume normal activity the day after the procedure however you should NOT DRIVE or use heavy machinery for 24 hours (because of the sedation medicines used during the test).    SYMPTOMS TO REPORT IMMEDIATELY: A gastroenterologist  can be reached at any hour.  During normal business hours, 8:30 AM to 5:00 PM Monday through Friday, call [phone].  After hours and on weekends, please call the GI answering service at [phone] who will take a message and have the physician on call contact you.   Following lower endoscopy (colonoscopy or flexible sigmoidoscopy):  Excessive amounts of blood in the stool  Significant tenderness or worsening of abdominal pains  Swelling of the abdomen that is new, acute  Fever of 100F or higher  FOLLOW UP: If any biopsies were taken you will be contacted by phone or by letter within the next 1-3 weeks.  Call your gastroenterologist if you have not heard about the biopsies in 3 weeks.  Our staff will call the home number listed on your records the next business day following your procedure to check on you and address any questions or concerns that you may have at that time regarding the information given to you following your procedure. This is a courtesy call and so if there is no answer at the home number and we have not heard from you through the emergency physician on call, we will assume that you have returned to your regular daily activities without incident.  SIGNATURES/CONFIDENTIALITY: You and/or your care partner have signed paperwork which will be entered into your electronic medical record.  These signatures attest to the fact that that the information above on your After Visit Summary has been reviewed   and is understood.  Full responsibility of the confidentiality of this discharge information lies with you and/or your care-partner. 

## 2013-12-20 ENCOUNTER — Telehealth: Payer: Self-pay

## 2013-12-20 NOTE — Telephone Encounter (Signed)
Left a message at [phone] for the pt to call us back if any questions or concerns. maw

## 2013-12-27 ENCOUNTER — Encounter: Payer: Self-pay | Admitting: Gastroenterology

## 2014-01-09 ENCOUNTER — Other Ambulatory Visit: Payer: Self-pay | Admitting: Family Medicine

## 2014-01-09 NOTE — Telephone Encounter (Signed)
Refill appropriate and filled per protocol. 

## 2014-02-21 DIAGNOSIS — Z872 Personal history of diseases of the skin and subcutaneous tissue: Secondary | ICD-10-CM | POA: Diagnosis not present

## 2014-03-06 ENCOUNTER — Other Ambulatory Visit (HOSPITAL_COMMUNITY): Payer: Self-pay | Admitting: Obstetrics and Gynecology

## 2014-03-06 DIAGNOSIS — Z1231 Encounter for screening mammogram for malignant neoplasm of breast: Secondary | ICD-10-CM

## 2014-03-14 ENCOUNTER — Ambulatory Visit (HOSPITAL_COMMUNITY): Payer: Medicare Other

## 2014-03-16 ENCOUNTER — Ambulatory Visit (HOSPITAL_COMMUNITY)
Admission: RE | Admit: 2014-03-16 | Discharge: 2014-03-16 | Disposition: A | Discharge status: Home / Self Care | Payer: Medicare Other | Source: Ambulatory Visit | Attending: Obstetrics and Gynecology | Admitting: Obstetrics and Gynecology

## 2014-03-16 DIAGNOSIS — Z1231 Encounter for screening mammogram for malignant neoplasm of breast: Secondary | ICD-10-CM

## 2014-03-23 DIAGNOSIS — H25099 Other age-related incipient cataract, unspecified eye: Secondary | ICD-10-CM | POA: Diagnosis not present

## 2014-04-24 ENCOUNTER — Other Ambulatory Visit: Payer: Self-pay | Admitting: Family Medicine

## 2014-05-29 ENCOUNTER — Other Ambulatory Visit: Payer: Self-pay | Admitting: Family Medicine

## 2014-06-05 ENCOUNTER — Ambulatory Visit (INDEPENDENT_AMBULATORY_CARE_PROVIDER_SITE_OTHER): Payer: Medicare Other | Admitting: Family Medicine

## 2014-06-05 ENCOUNTER — Encounter: Payer: Self-pay | Admitting: Family Medicine

## 2014-06-05 VITALS — BP 126/74 | HR 76 | Temp 97.9°F | Resp 18 | Ht 64.0 in | Wt 253.0 lb

## 2014-06-05 DIAGNOSIS — K5732 Diverticulitis of large intestine without perforation or abscess without bleeding: Secondary | ICD-10-CM | POA: Diagnosis not present

## 2014-06-05 LAB — COMPLETE METABOLIC PANEL WITH GFR
ALK PHOS: 52 U/L (ref 39–117)
ALT: 13 U/L (ref 0–35)
AST: 16 U/L (ref 0–37)
Albumin: 4.3 g/dL (ref 3.5–5.2)
BILIRUBIN TOTAL: 0.5 mg/dL (ref 0.2–1.2)
BUN: 12 mg/dL (ref 6–23)
CO2: 31 meq/L (ref 19–32)
CREATININE: 0.9 mg/dL (ref 0.50–1.10)
Calcium: 9.8 mg/dL (ref 8.4–10.5)
Chloride: 98 mEq/L (ref 96–112)
GFR, EST AFRICAN AMERICAN: 77 mL/min
GFR, Est Non African American: 67 mL/min
Glucose, Bld: 94 mg/dL (ref 70–99)
Potassium: 3.6 mEq/L (ref 3.5–5.3)
Sodium: 139 mEq/L (ref 135–145)
TOTAL PROTEIN: 7.3 g/dL (ref 6.0–8.3)

## 2014-06-05 LAB — CBC WITH DIFFERENTIAL/PLATELET
BASOS ABS: 0 10*3/uL (ref 0.0–0.1)
BASOS PCT: 0 % (ref 0–1)
EOS ABS: 0.1 10*3/uL (ref 0.0–0.7)
Eosinophils Relative: 2 % (ref 0–5)
HCT: 43.6 % (ref 36.0–46.0)
Hemoglobin: 14.7 g/dL (ref 12.0–15.0)
Lymphocytes Relative: 28 % (ref 12–46)
Lymphs Abs: 2.1 10*3/uL (ref 0.7–4.0)
MCH: 28.5 pg (ref 26.0–34.0)
MCHC: 33.7 g/dL (ref 30.0–36.0)
MCV: 84.5 fL (ref 78.0–100.0)
MONOS PCT: 9 % (ref 3–12)
MPV: 11.5 fL (ref 8.6–12.4)
Monocytes Absolute: 0.7 10*3/uL (ref 0.1–1.0)
NEUTROS ABS: 4.5 10*3/uL (ref 1.7–7.7)
Neutrophils Relative %: 61 % (ref 43–77)
Platelets: 258 10*3/uL (ref 150–400)
RBC: 5.16 MIL/uL — ABNORMAL HIGH (ref 3.87–5.11)
RDW: 14.1 % (ref 11.5–15.5)
WBC: 7.4 10*3/uL (ref 4.0–10.5)

## 2014-06-05 MED ORDER — CIPROFLOXACIN HCL 500 MG PO TABS: 500.0000 mg | ORAL_TABLET | Freq: Two times a day (BID) | ORAL | Status: DC

## 2014-06-05 MED ORDER — METRONIDAZOLE 500 MG PO TABS: 500.0000 mg | ORAL_TABLET | Freq: Two times a day (BID) | ORAL | Status: DC

## 2014-06-05 NOTE — Progress Notes (Signed)
Subjective:    Patient ID: [name], female    DOB: 1945-12-15, 69 y.o.   MRN: [id number]  HPI   patient presents with 2 days of left lower quadrant abdominal pain that radiates from her left lower abdomen into her back. She is tender to palpation in the left lower quadrant. She has a history of hysterectomy. She denies any melena or hematochezia. The pain is worse with food. She denies any nausea or vomiting. She denies any dysuria or hematuria. She has no CVA tenderness. Past Medical History  Diagnosis Date  . Murmur, heart   . Hyperlipidemia   . Hypertension   . Colon polyps     benign- last colonoscopy 09-2008  . H/O seasonal allergies     no problems now  . Arthritis     arthritis -knees   Past Surgical History  Procedure Laterality Date  . Abdominal hysterectomy    . Cholecystectomy      open-gallstones  . Appendectomy      with gallbladder surgery  . Oophorectomy Bilateral 2008    ovarian cyst  . Bunionectomy Bilateral   . Excision of adnexal mass Left August 03, 2013    at [facility] [place]/right adnexal nodule,excision;benigh lymph node   Current Outpatient Prescriptions on File Prior to Visit  Medication Sig Dispense Refill  . diltiazem (CARDIZEM) 120 MG tablet TAKE ONE TABLET BY MOUTH THREE TIMES DAILY 270 tablet 3  . hydrochlorothiazide (HYDRODIURIL) 25 MG tablet TAKE ONE TABLET BY MOUTH ONCE DAILY 90 tablet 3  . lisinopril (PRINIVIL,ZESTRIL) 20 MG tablet TAKE TWO TABLETS BY MOUTH ONCE DAILY IN THE MORNING 90 tablet 2  . Multiple Vitamins-Minerals (HAIR/SKIN/NAILS PO) Take 1 tablet by mouth daily.    . naproxen (NAPROSYN) 500 MG tablet Take 500 mg by mouth 2 (two) times daily as needed for mild pain.    . naproxen (NAPROSYN) 500 MG tablet TAKE ONE TABLET BY MOUTH TWICE DAILY AS NEEDED FOR  ARTHRITIS 60 tablet 2  . polyethylene glycol (MIRALAX / GLYCOLAX) packet Take 17 g by mouth as needed.    Marland Kitchen tetrahydrozoline 0.05 % ophthalmic solution Place 1 drop into both eyes  as needed (eye allergies).     No current facility-administered medications on file prior to visit.   No Known Allergies History   Social History  . Marital Status: Married    Spouse Name: N/A  . Number of Children: N/A  . Years of Education: N/A   Occupational History  . Not on file.   Social History Main Topics  . Smoking status: Never Smoker   . Smokeless tobacco: Never Used  . Alcohol Use: No  . Drug Use: No  . Sexual Activity: Yes   Other Topics Concern  . Not on file   Social History Narrative     Review of Systems  All other systems reviewed and are negative.      Objective:   Physical Exam  Cardiovascular: Normal rate, regular rhythm and normal heart sounds.   No murmur heard. Pulmonary/Chest: Effort normal and breath sounds normal. No respiratory distress. She has no wheezes. She has no rales.  Abdominal: Soft. She exhibits no distension and no mass. There is tenderness. There is guarding. There is no rebound.  Vitals reviewed.         Assessment & Plan:  Diverticulitis of colon without hemorrhage - Plan: ciprofloxacin (CIPRO) 500 MG tablet, metroNIDAZOLE (FLAGYL) 500 MG tablet, CBC with Differential/Platelet, COMPLETE METABOLIC PANEL WITH GFR  I believe the patient has diverticulitis. Begin Cipro 500 mg by mouth twice a day and Flagyl 500 mg by mouth twice a day for 14 days. Recheck in 48 hours if no better proceed with a CT scan. Seek medical attention immediately if worse.

## 2014-07-18 ENCOUNTER — Encounter: Payer: Self-pay | Admitting: Family Medicine

## 2014-07-18 ENCOUNTER — Ambulatory Visit (INDEPENDENT_AMBULATORY_CARE_PROVIDER_SITE_OTHER): Payer: Medicare Other | Admitting: Family Medicine

## 2014-07-18 VITALS — BP 130/84 | HR 78 | Temp 98.2°F | Resp 18 | Ht 64.0 in | Wt 256.0 lb

## 2014-07-18 DIAGNOSIS — M5441 Lumbago with sciatica, right side: Secondary | ICD-10-CM

## 2014-07-18 MED ORDER — PREDNISONE 20 MG PO TABS: ORAL_TABLET | ORAL | Status: DC

## 2014-07-18 NOTE — Progress Notes (Signed)
Subjective:    Patient ID: [name], female    DOB: 1945-04-07, 69 y.o.   MRN: [id number]  HPI Patient has had pain in her right side of her lower back for 2 weeks. The pain radiates into her posterior right hip, down the lateral aspect of her right thigh in the lateral aspect of her right knee and into the posterior aspect of her right calf. She also has tenderness and pain over the lateral joint line of the right knee. She has pain with range of motion in the right knee. There is no erythema or effusion. Past Medical History  Diagnosis Date  . Murmur, heart   . Hyperlipidemia   . Hypertension   . Colon polyps     benign- last colonoscopy 09-2008  . H/O seasonal allergies     no problems now  . Arthritis     arthritis -knees   Past Surgical History  Procedure Laterality Date  . Abdominal hysterectomy    . Cholecystectomy      open-gallstones  . Appendectomy      with gallbladder surgery  . Oophorectomy Bilateral 2008    ovarian cyst  . Bunionectomy Bilateral   . Excision of adnexal mass Left August 03, 2013    at [facility]/right adnexal nodule,excision;benigh lymph node   Current Outpatient Prescriptions on File Prior to Visit  Medication Sig Dispense Refill  . diltiazem (CARDIZEM) 120 MG tablet TAKE ONE TABLET BY MOUTH THREE TIMES DAILY 270 tablet 3  . hydrochlorothiazide (HYDRODIURIL) 25 MG tablet TAKE ONE TABLET BY MOUTH ONCE DAILY 90 tablet 3  . lisinopril (PRINIVIL,ZESTRIL) 20 MG tablet TAKE TWO TABLETS BY MOUTH ONCE DAILY IN THE MORNING 90 tablet 2  . metroNIDAZOLE (FLAGYL) 500 MG tablet Take 1 tablet (500 mg total) by mouth 2 (two) times daily. 28 tablet 0  . Multiple Vitamins-Minerals (HAIR/SKIN/NAILS PO) Take 1 tablet by mouth daily.    . naproxen (NAPROSYN) 500 MG tablet Take 500 mg by mouth 2 (two) times daily as needed for mild pain.    . naproxen (NAPROSYN) 500 MG tablet TAKE ONE TABLET BY MOUTH TWICE DAILY AS NEEDED FOR  ARTHRITIS 60 tablet 2  .  polyethylene glycol (MIRALAX / GLYCOLAX) packet Take 17 g by mouth as needed.    Marland Kitchen tetrahydrozoline 0.05 % ophthalmic solution Place 1 drop into both eyes as needed (eye allergies).     No current facility-administered medications on file prior to visit.   No Known Allergies History   Social History  . Marital Status: Married    Spouse Name: N/A  . Number of Children: N/A  . Years of Education: N/A   Occupational History  . Not on file.   Social History Main Topics  . Smoking status: Never Smoker   . Smokeless tobacco: Never Used  . Alcohol Use: No  . Drug Use: No  . Sexual Activity: Yes   Other Topics Concern  . Not on file   Social History Narrative     Review of Systems  All other systems reviewed and are negative.      Objective:   Physical Exam  Constitutional: She appears well-developed and well-nourished.  Cardiovascular: Normal rate, regular rhythm and normal heart sounds.   Pulmonary/Chest: Effort normal and breath sounds normal. No respiratory distress. She has no wheezes. She has no rales.  Abdominal: Soft. Bowel sounds are normal.  Musculoskeletal:       Lumbar back: She exhibits decreased range of  motion, tenderness and pain. She exhibits no bony tenderness.  Neurological: She displays normal reflexes. She exhibits normal muscle tone.  Vitals reviewed.         Assessment & Plan:  Right-sided low back pain with right-sided sciatica - Plan: predniSONE (DELTASONE) 20 MG tablet  I believe the majority of the patient's pain is due to sciatica most likely from degenerative disc disease/bulging disc that she was diagnosed with several years ago according to her report. We will treat this with a prednisone taper pack and recheck in one week. If the pain improves in her lower back and in her leg but the pain persisted her right knee, I would next undergo workup for the right knee pain including an x-ray of the knee although I suspect that she has arthritis  in the right knee as well. She may require cortisone injection in the right knee if the pain persists after prednisone taper pack

## 2014-10-13 ENCOUNTER — Other Ambulatory Visit: Payer: Self-pay | Admitting: Family Medicine

## 2014-10-13 NOTE — Telephone Encounter (Signed)
Medication refilled per protocol. 

## 2014-10-20 ENCOUNTER — Ambulatory Visit (INDEPENDENT_AMBULATORY_CARE_PROVIDER_SITE_OTHER): Payer: Medicare Other | Admitting: Family Medicine

## 2014-10-20 ENCOUNTER — Encounter: Payer: Self-pay | Admitting: Family Medicine

## 2014-10-20 VITALS — BP 136/78 | HR 72 | Temp 98.4°F | Resp 18 | Ht 64.0 in | Wt 258.0 lb

## 2014-10-20 DIAGNOSIS — E785 Hyperlipidemia, unspecified: Secondary | ICD-10-CM | POA: Diagnosis not present

## 2014-10-20 DIAGNOSIS — R0789 Other chest pain: Secondary | ICD-10-CM | POA: Diagnosis not present

## 2014-10-20 DIAGNOSIS — Z0000 Encounter for general adult medical examination without abnormal findings: Secondary | ICD-10-CM

## 2014-10-20 DIAGNOSIS — I10 Essential (primary) hypertension: Secondary | ICD-10-CM

## 2014-10-20 DIAGNOSIS — Z23 Encounter for immunization: Secondary | ICD-10-CM

## 2014-10-20 LAB — COMPLETE METABOLIC PANEL WITH GFR
ALT: 12 U/L (ref 6–29)
AST: 16 U/L (ref 10–35)
Albumin: 4 g/dL (ref 3.6–5.1)
Alkaline Phosphatase: 51 U/L (ref 33–130)
BUN: 14 mg/dL (ref 7–25)
CALCIUM: 8.9 mg/dL (ref 8.6–10.4)
CHLORIDE: 100 mmol/L (ref 98–110)
CO2: 31 mmol/L (ref 20–31)
CREATININE: 0.86 mg/dL (ref 0.50–0.99)
GFR, Est African American: 81 mL/min (ref >=60)
GFR, Est Non African American: 70 mL/min (ref >=60)
GLUCOSE: 98 mg/dL (ref 70–99)
Potassium: 3.6 mmol/L (ref 3.5–5.3)
SODIUM: 141 mmol/L (ref 135–146)
Total Bilirubin: 0.5 mg/dL (ref 0.2–1.2)
Total Protein: 6.5 g/dL (ref 6.1–8.1)

## 2014-10-20 LAB — CBC WITH DIFFERENTIAL/PLATELET
Basophils Absolute: 0 10*3/uL (ref 0.0–0.1)
Basophils Relative: 0 % (ref 0–1)
Eosinophils Absolute: 0.3 10*3/uL (ref 0.0–0.7)
Eosinophils Relative: 5 % (ref 0–5)
HCT: 41.5 % (ref 36.0–46.0)
HEMOGLOBIN: 14 g/dL (ref 12.0–15.0)
LYMPHS ABS: 1.8 10*3/uL (ref 0.7–4.0)
Lymphocytes Relative: 26 % (ref 12–46)
MCH: 29.2 pg (ref 26.0–34.0)
MCHC: 33.7 g/dL (ref 30.0–36.0)
MCV: 86.5 fL (ref 78.0–100.0)
MPV: 11.3 fL (ref 8.6–12.4)
Monocytes Absolute: 0.5 10*3/uL (ref 0.1–1.0)
Monocytes Relative: 7 % (ref 3–12)
NEUTROS ABS: 4.3 10*3/uL (ref 1.7–7.7)
NEUTROS PCT: 62 % (ref 43–77)
Platelets: 230 10*3/uL (ref 150–400)
RBC: 4.8 MIL/uL (ref 3.87–5.11)
RDW: 13.8 % (ref 11.5–15.5)
WBC: 6.9 10*3/uL (ref 4.0–10.5)

## 2014-10-20 LAB — LIPID PANEL
Cholesterol: 193 mg/dL (ref 125–200)
HDL: 48 mg/dL (ref >=46)
LDL Cholesterol: 123 mg/dL (ref <130)
Total CHOL/HDL Ratio: 4 Ratio (ref <=5.0)
Triglycerides: 112 mg/dL (ref <150)
VLDL: 22 mg/dL (ref <30)

## 2014-10-20 NOTE — Progress Notes (Signed)
Subjective:    Patient ID: [name], female    DOB: [birth year], 69 y.o.   MRN: [id number]  HPI  Subjective:   Patient presents for Medicare Annual/Subsequent preventive examination.  Patient has a history of oopherectomy for pelvic pain.Marland Kitchen Her colonoscopy was performed 2015.  Patient reports that she had a mammogram in 2/16.  Patient states that the mammogram was normal. She has a history of a hysterectomy and therefore does not require Pap smears.  She is also due for Prevnar 13, a flu shot, tetanus shot, and Zostavax. Patient is willing to receive Prevnar 13 but she declines a flu shot, the tetanus shot, and the shingles shot. She did have one episode earlier this week where she developed pressure in the center of her chest. She was lying in bed. After sitting up the pressure went away after 1 minute. She denied any shortness of breath. There was no exacerbating or alleviating factors. The patient walks at least 2 or 3 times a week vigorously. She denies any chest pain or pressure in her chest or shortness of breath with exercise. This was an isolated event that only lasted 1 minute and was related to a supine position. However she does have a family history of heart disease in her father and she certainly has risk factors including high blood pressure high cholesterol and age Review Past Medical/Family/Social: Past Medical History  Diagnosis Date  . Murmur, heart   . Hyperlipidemia   . Hypertension   . Colon polyps     benign- last colonoscopy 09-2008  . H/O seasonal allergies     no problems now  . Arthritis     arthritis -knees   Past Surgical History  Procedure Laterality Date  . Abdominal hysterectomy    . Cholecystectomy      open-gallstones  . Appendectomy      with gallbladder surgery  . Oophorectomy Bilateral 2008    ovarian cyst  . Bunionectomy Bilateral   . Excision of adnexal mass Left August 03, 2013    at [facility]/right adnexal nodule,excision;benigh lymph node     Current Outpatient Prescriptions on File Prior to Visit  Medication Sig Dispense Refill  . diltiazem (CARDIZEM) 120 MG tablet TAKE ONE TABLET BY MOUTH THREE TIMES DAILY 270 tablet 3  . hydrochlorothiazide (HYDRODIURIL) 25 MG tablet TAKE ONE TABLET BY MOUTH ONCE DAILY 90 tablet 3  . lisinopril (PRINIVIL,ZESTRIL) 20 MG tablet TAKE TWO TABLETS BY MOUTH ONCE DAILY IN THE MORNING 60 tablet 0  . Multiple Vitamins-Minerals (HAIR/SKIN/NAILS PO) Take 1 tablet by mouth daily.    . naproxen (NAPROSYN) 500 MG tablet Take 500 mg by mouth 2 (two) times daily as needed for mild pain.    . polyethylene glycol (MIRALAX / GLYCOLAX) packet Take 17 g by mouth as needed.    . predniSONE (DELTASONE) 20 MG tablet 3 tabs poqday 1-2, 2 tabs poqday 3-4, 1 tab poqday 5-6 12 tablet 0  . tetrahydrozoline 0.05 % ophthalmic solution Place 1 drop into both eyes as needed (eye allergies).     No current facility-administered medications on file prior to visit.   No Known Allergies Social History   Social History  . Marital Status: Married    Spouse Name: N/A  . Number of Children: N/A  . Years of Education: N/A   Occupational History  . Not on file.   Social History Main Topics  . Smoking status: Never Smoker   . Smokeless tobacco: Never Used  .  Alcohol Use: No  . Drug Use: No  . Sexual Activity: Yes   Other Topics Concern  . Not on file   Social History Narrative   Family History  Problem Relation Age of Onset  . Colon cancer Mother 71  . Heart attack Father   . Esophageal cancer Neg Hx   . Pancreatic cancer Neg Hx   . Rectal cancer Neg Hx   . Stomach cancer Neg Hx     Depression Screen  (Note: if answer to either of the following is "Yes", a more complete depression screening is indicated)  Over the past two weeks, have you felt down, depressed or hopeless? No Over the past two weeks, have you felt little interest or pleasure in doing things? No Have you lost interest or pleasure in daily  life? No Do you often feel hopeless? No Do you cry easily over simple problems? No   Activities of Daily Living  In your present state of health, do you have any difficulty performing the following activities?:  Driving? No  Managing money? No  Feeding yourself? No  Getting from bed to chair? No  Climbing a flight of stairs? No  Preparing food and eating?: No  Bathing or showering? No  Getting dressed: No  Getting to the toilet? No  Using the toilet:No  Moving around from place to place: No  In the past year have you fallen or had a near fall?:No  Are you sexually active? No  Do you have more than one partner? No   Hearing Difficulties: No  Do you often ask people to speak up or repeat themselves? No  Do you experience ringing or noises in your ears? No Do you have difficulty understanding soft or whispered voices? No  Do you feel that you have a problem with memory? No Do you often misplace items? No  Do you feel safe at home? Yes  Cognitive Testing  Alert? Yes Normal Appearance?Yes  Oriented to person? Yes Place? Yes  Time? Yes  Recall of three objects? Yes  Can perform simple calculations? Yes  Displays appropriate judgment?Yes  Can read the correct time from a watch face?Yes   Screening Tests / Date Colonoscopy   2015                 Zostavax  Mammogram 02/2014 Influenza Vaccine due Tetanus/tdap Pneumoax 23 05/2012  Review of Systems  All other systems reviewed and are negative.      Objective:   Physical Exam  Constitutional: She is oriented to person, place, and time. She appears well-developed and well-nourished. No distress.  HENT:  Head: Normocephalic and atraumatic.  Right Ear: External ear normal.  Left Ear: External ear normal.  Nose: Nose normal.  Mouth/Throat: Oropharynx is clear and moist. No oropharyngeal exudate.  Eyes: Conjunctivae and EOM are normal. Pupils are equal, round, and reactive to light. Right eye exhibits no discharge. Left eye  exhibits no discharge. No scleral icterus.  Neck: Normal range of motion. Neck supple. No JVD present. No tracheal deviation present. No thyromegaly present.  Cardiovascular: Normal rate, regular rhythm, normal heart sounds and intact distal pulses.  Exam reveals no gallop and no friction rub.   No murmur heard. Pulmonary/Chest: Effort normal and breath sounds normal. No stridor. No respiratory distress. She has no wheezes. She has no rales. She exhibits no tenderness.  Abdominal: Soft. Bowel sounds are normal. She exhibits no distension and no mass. There is no tenderness. There is  no rebound and no guarding.  Musculoskeletal: Normal range of motion. She exhibits no edema or tenderness.  Lymphadenopathy:    She has no cervical adenopathy.  Neurological: She is alert and oriented to person, place, and time. She has normal reflexes. No cranial nerve deficit. She exhibits normal muscle tone. Coordination normal.  Skin: Skin is warm. No rash noted. She is not diaphoretic. No erythema. No pallor.  Psychiatric: She has a normal mood and affect. Her behavior is normal. Judgment and thought content normal.  Vitals reviewed.         Assessment & Plan:  Essential hypertension - Plan: CBC with Differential/Platelet, COMPLETE METABOLIC PANEL WITH GFR, Lipid panel  Routine general medical examination at a health care facility  HLD (hyperlipidemia) - Plan: COMPLETE METABOLIC PANEL WITH GFR, Lipid panel Patient's physical exam is normal. I will check a TSH, cbc, cmp, and flp. Cancer screening is otherwise up to date. I recommended a tetanus shot, flu shot, shingle shot, and Prevnar 13.  Otherwise her preventive care is up to date.  Patient declines all vaccinations except Prevnar 26. EKG shows normal sinus rhythm with normal intervals and a normal axis with no evidence of ischemia or infarction. I believe atypical chest pain was likely GERD related. If patient continues to have pressure in her chest, I  would refer her for a stress test.  However her symptoms do not sound cardiac in nature Medicare Attestation  I have personally reviewed:  The patient's medical and social history  Their use of alcohol, tobacco or illicit drugs  Their current medications and supplements  The patient's functional ability including ADLs,fall risks, home safety risks, cognitive, and hearing and visual impairment  Diet and physical activities  Evidence for depression or mood disorders  The patient's weight, height, BMI, and visual acuity have been recorded in the chart. I have made referrals, counseling, and provided education to the patient based on review of the above and I have provided the patient with a written personalized care plan for preventive services.

## 2014-10-24 ENCOUNTER — Encounter: Payer: Self-pay | Admitting: Family Medicine

## 2014-11-14 ENCOUNTER — Other Ambulatory Visit: Payer: Self-pay | Admitting: Family Medicine

## 2014-11-14 NOTE — Telephone Encounter (Signed)
Refill appropriate and filled per protocol. 

## 2014-12-11 DIAGNOSIS — Z01419 Encounter for gynecological examination (general) (routine) without abnormal findings: Secondary | ICD-10-CM | POA: Diagnosis not present

## 2015-01-16 ENCOUNTER — Telehealth: Payer: Self-pay | Admitting: Family Medicine

## 2015-01-16 NOTE — Telephone Encounter (Signed)
Patient aware.

## 2015-01-16 NOTE — Telephone Encounter (Signed)
PATIENT IS CALLING TO SAY THAT HER GRANDCHILD HAS THE FLU, WOULD LIKE FOR YOU TO CALL HER REGARDING A TAMIFLU RX  [phone]

## 2015-01-16 NOTE — Telephone Encounter (Signed)
Called and spoke to pt and her grandkid does have flu and peds stated to them that they may need to be on something as preventive since they did not get the flu shot. Pt is not having any symptoms at the moment.

## 2015-01-16 NOTE — Telephone Encounter (Signed)
I wouldn't start anything at present but if symptoms start I would start tamiflu 75 bid for 5 days.

## 2015-02-01 ENCOUNTER — Ambulatory Visit (INDEPENDENT_AMBULATORY_CARE_PROVIDER_SITE_OTHER): Payer: Medicare Other | Admitting: Family Medicine

## 2015-02-01 ENCOUNTER — Encounter: Payer: Self-pay | Admitting: Family Medicine

## 2015-02-01 VITALS — BP 128/78 | HR 68 | Temp 97.5°F | Resp 16 | Ht 64.0 in | Wt 260.0 lb

## 2015-02-01 DIAGNOSIS — L500 Allergic urticaria: Secondary | ICD-10-CM

## 2015-02-01 MED ORDER — PREDNISONE 20 MG PO TABS: ORAL_TABLET | ORAL | Status: DC

## 2015-02-01 MED ORDER — HYDROXYZINE HCL 50 MG PO TABS: 25.0000 mg | ORAL_TABLET | Freq: Four times a day (QID) | ORAL | Status: DC | PRN

## 2015-02-01 NOTE — Progress Notes (Signed)
Subjective:    Patient ID: [name], female    DOB: [birth year], 70 y.o.   MRN: [id number]  HPI  over the last 3 weeks, the patient has been breaking out in hives on her neck and on her chin  That are spreading down to her upper chest. There are very pruritic. She has no known sick contacts. No one else in the family is itching. There has been no change in her detergents, perfume, soap, lotions, etc. She denies any new foods. She is allergic to cashews but she has not been eating cashews. There has been a recent change in her medication. Over the last 2-3 weeks she's been taking Aleve on a daily basis. Otherwise there have been no changes in her daily routine Past Medical History  Diagnosis Date  . Murmur, heart   . Hyperlipidemia   . Hypertension   . Colon polyps     benign- last colonoscopy 09-2008  . H/O seasonal allergies     no problems now  . Arthritis     arthritis -knees   Past Surgical History  Procedure Laterality Date  . Abdominal hysterectomy    . Cholecystectomy      open-gallstones  . Appendectomy      with gallbladder surgery  . Oophorectomy Bilateral 2008    ovarian cyst  . Bunionectomy Bilateral   . Excision of adnexal mass Left August 03, 2013    at [facility]/right adnexal nodule,excision;benigh lymph node   Current Outpatient Prescriptions on File Prior to Visit  Medication Sig Dispense Refill  . diltiazem (CARDIZEM) 120 MG tablet TAKE ONE TABLET BY MOUTH THREE TIMES DAILY 270 tablet 3  . hydrochlorothiazide (HYDRODIURIL) 25 MG tablet TAKE ONE TABLET BY MOUTH ONCE DAILY 90 tablet 3  . lisinopril (PRINIVIL,ZESTRIL) 20 MG tablet TAKE TWO TABLETS BY MOUTH ONCE DAILY IN THE MORNING 60 tablet 3  . Multiple Vitamins-Minerals (HAIR/SKIN/NAILS PO) Take 1 tablet by mouth daily.    . naproxen (NAPROSYN) 500 MG tablet Take 500 mg by mouth 2 (two) times daily as needed for mild pain.    . polyethylene glycol (MIRALAX / GLYCOLAX) packet Take 17 g by mouth as needed.      Marland Kitchen tetrahydrozoline 0.05 % ophthalmic solution Place 1 drop into both eyes as needed (eye allergies).     No current facility-administered medications on file prior to visit.   No Known Allergies Social History   Social History  . Marital Status: Married    Spouse Name: N/A  . Number of Children: N/A  . Years of Education: N/A   Occupational History  . Not on file.   Social History Main Topics  . Smoking status: Never Smoker   . Smokeless tobacco: Never Used  . Alcohol Use: No  . Drug Use: No  . Sexual Activity: Yes   Other Topics Concern  . Not on file   Social History Narrative      Review of Systems  All other systems reviewed and are negative.      Objective:   Physical Exam  Neck: Neck supple.  Cardiovascular: Normal rate, regular rhythm and normal heart sounds.   Pulmonary/Chest: Breath sounds normal. No stridor. No respiratory distress. She has no wheezes. She has no rales.  Skin: Rash noted.  Vitals reviewed.         Assessment & Plan:   Patient has urticaria on her neck and on her upper chest. I am not sure what the allergic  reaction as to. I have asked her to discontinue Aleve. I will treat her with a prednisone taper pack as well as Atarax 25 mg every 6 hours. Consider allergy testing if rash persists after medication changes.

## 2015-02-12 ENCOUNTER — Telehealth: Payer: Self-pay | Admitting: Family Medicine

## 2015-02-12 NOTE — Telephone Encounter (Signed)
Patient is still itching in her face would like a call back regarding this  [phone]

## 2015-02-12 NOTE — Telephone Encounter (Signed)
Pt is still itching on her face - I did inform her that you wanted her to see an allergist if rash persists but she just wants something to clear it up. She states that the Pred helped some but not a lot. She is also still having side pain.

## 2015-02-13 MED ORDER — DOXYCYCLINE HYCLATE 100 MG PO TABS: 100.0000 mg | ORAL_TABLET | Freq: Two times a day (BID) | ORAL | Status: DC

## 2015-02-13 NOTE — Telephone Encounter (Signed)
Pt aware of below and med sent to pharm. She did forget to mention to [facility] about side pain but I recommended if it gets worse please contact our office for appt. She verbalized understanding.

## 2015-02-13 NOTE — Telephone Encounter (Signed)
I don't recall her mentioning side pain so i am not sure what the cause of that would be.  Lets try doxycycline 100 bid for possible rosacea on face to see if rash improves and if not better we will need to consult allergist.  Could also use atarax for itching.

## 2015-02-19 ENCOUNTER — Other Ambulatory Visit: Payer: Self-pay

## 2015-02-19 DIAGNOSIS — Z1231 Encounter for screening mammogram for malignant neoplasm of breast: Secondary | ICD-10-CM

## 2015-03-20 ENCOUNTER — Other Ambulatory Visit: Payer: Self-pay | Admitting: Family Medicine

## 2015-03-20 NOTE — Telephone Encounter (Signed)
Medication refilled per protocol. 

## 2015-03-23 ENCOUNTER — Ambulatory Visit
Admission: RE | Admit: 2015-03-23 | Discharge: 2015-03-23 | Disposition: A | Discharge status: Home / Self Care | Payer: Medicare Other | Source: Ambulatory Visit

## 2015-03-23 DIAGNOSIS — Z1231 Encounter for screening mammogram for malignant neoplasm of breast: Secondary | ICD-10-CM | POA: Diagnosis not present

## 2015-04-04 ENCOUNTER — Other Ambulatory Visit: Payer: Self-pay | Admitting: Family Medicine

## 2015-04-06 ENCOUNTER — Other Ambulatory Visit: Payer: Self-pay | Admitting: Family Medicine

## 2015-05-01 DIAGNOSIS — M1712 Unilateral primary osteoarthritis, left knee: Secondary | ICD-10-CM | POA: Diagnosis not present

## 2015-05-01 DIAGNOSIS — M5441 Lumbago with sciatica, right side: Secondary | ICD-10-CM | POA: Diagnosis not present

## 2015-05-01 DIAGNOSIS — M47896 Other spondylosis, lumbar region: Secondary | ICD-10-CM | POA: Diagnosis not present

## 2015-05-01 DIAGNOSIS — M25562 Pain in left knee: Secondary | ICD-10-CM | POA: Diagnosis not present

## 2015-05-15 DIAGNOSIS — M5441 Lumbago with sciatica, right side: Secondary | ICD-10-CM | POA: Diagnosis not present

## 2015-05-15 DIAGNOSIS — G8929 Other chronic pain: Secondary | ICD-10-CM | POA: Diagnosis not present

## 2015-05-22 DIAGNOSIS — M5441 Lumbago with sciatica, right side: Secondary | ICD-10-CM | POA: Diagnosis not present

## 2015-05-22 DIAGNOSIS — G8929 Other chronic pain: Secondary | ICD-10-CM | POA: Diagnosis not present

## 2015-05-29 DIAGNOSIS — G8929 Other chronic pain: Secondary | ICD-10-CM | POA: Diagnosis not present

## 2015-05-29 DIAGNOSIS — M5441 Lumbago with sciatica, right side: Secondary | ICD-10-CM | POA: Diagnosis not present

## 2015-05-30 DIAGNOSIS — I10 Essential (primary) hypertension: Secondary | ICD-10-CM | POA: Diagnosis not present

## 2015-05-30 DIAGNOSIS — H25099 Other age-related incipient cataract, unspecified eye: Secondary | ICD-10-CM | POA: Diagnosis not present

## 2015-06-04 ENCOUNTER — Other Ambulatory Visit: Payer: Self-pay | Admitting: Family Medicine

## 2015-06-04 NOTE — Telephone Encounter (Signed)
Refill appropriate and filled per protocol. 

## 2015-06-11 DIAGNOSIS — M5441 Lumbago with sciatica, right side: Secondary | ICD-10-CM | POA: Diagnosis not present

## 2015-06-11 DIAGNOSIS — G8929 Other chronic pain: Secondary | ICD-10-CM | POA: Diagnosis not present

## 2015-06-12 DIAGNOSIS — M4316 Spondylolisthesis, lumbar region: Secondary | ICD-10-CM | POA: Diagnosis not present

## 2015-06-12 DIAGNOSIS — M1712 Unilateral primary osteoarthritis, left knee: Secondary | ICD-10-CM | POA: Diagnosis not present

## 2015-06-12 DIAGNOSIS — M5441 Lumbago with sciatica, right side: Secondary | ICD-10-CM | POA: Diagnosis not present

## 2015-06-12 DIAGNOSIS — M47896 Other spondylosis, lumbar region: Secondary | ICD-10-CM | POA: Diagnosis not present

## 2015-07-17 ENCOUNTER — Other Ambulatory Visit: Payer: Self-pay | Admitting: Family Medicine

## 2015-07-17 NOTE — Telephone Encounter (Signed)
Medication refilled per protocol. 

## 2015-07-19 ENCOUNTER — Other Ambulatory Visit: Payer: Self-pay | Admitting: Family Medicine

## 2015-07-19 NOTE — Telephone Encounter (Signed)
Refill appropriate and filled per protocol. 

## 2015-08-09 ENCOUNTER — Ambulatory Visit (INDEPENDENT_AMBULATORY_CARE_PROVIDER_SITE_OTHER): Payer: Medicare Other | Admitting: Family Medicine

## 2015-08-09 VITALS — BP 138/92 | HR 68 | Temp 97.9°F | Resp 16 | Ht 64.0 in | Wt 254.0 lb

## 2015-08-09 DIAGNOSIS — R1032 Left lower quadrant pain: Secondary | ICD-10-CM | POA: Diagnosis not present

## 2015-08-09 DIAGNOSIS — R062 Wheezing: Secondary | ICD-10-CM | POA: Diagnosis not present

## 2015-08-09 LAB — CBC WITH DIFFERENTIAL/PLATELET
BASOS ABS: 68 {cells}/uL (ref 0–200)
Basophils Relative: 1 %
EOS ABS: 272 {cells}/uL (ref 15–500)
Eosinophils Relative: 4 %
HEMATOCRIT: 43 % (ref 35.0–45.0)
Hemoglobin: 14.4 g/dL (ref 12.0–15.0)
Lymphocytes Relative: 29 %
Lymphs Abs: 1972 cells/uL (ref 850–3900)
MCH: 28.7 pg (ref 27.0–33.0)
MCHC: 33.5 g/dL (ref 32.0–36.0)
MCV: 85.8 fL (ref 80.0–100.0)
MONO ABS: 612 {cells}/uL (ref 200–950)
MPV: 12 fL (ref 7.5–12.5)
Monocytes Relative: 9 %
NEUTROS PCT: 57 %
Neutro Abs: 3876 cells/uL (ref 1500–7800)
Platelets: 248 10*3/uL (ref 140–400)
RBC: 5.01 MIL/uL (ref 3.80–5.10)
RDW: 13.8 % (ref 11.0–15.0)
WBC: 6.8 10*3/uL (ref 3.8–10.8)

## 2015-08-09 LAB — COMPLETE METABOLIC PANEL WITH GFR
ALK PHOS: 50 U/L (ref 33–130)
ALT: 13 U/L (ref 6–29)
AST: 15 U/L (ref 10–35)
Albumin: 4.3 g/dL (ref 3.6–5.1)
BILIRUBIN TOTAL: 0.5 mg/dL (ref 0.2–1.2)
BUN: 15 mg/dL (ref 7–25)
CALCIUM: 9.2 mg/dL (ref 8.6–10.4)
CO2: 29 mmol/L (ref 20–31)
CREATININE: 0.87 mg/dL (ref 0.50–0.99)
Chloride: 99 mmol/L (ref 98–110)
GFR, EST NON AFRICAN AMERICAN: 69 mL/min (ref >=60)
GFR, Est African American: 79 mL/min (ref >=60)
GLUCOSE: 91 mg/dL (ref 70–99)
Potassium: 3.8 mmol/L (ref 3.5–5.3)
Sodium: 141 mmol/L (ref 135–146)
Total Protein: 6.7 g/dL (ref 6.1–8.1)

## 2015-08-09 MED ORDER — ALBUTEROL SULFATE HFA 108 (90 BASE) MCG/ACT IN AERS: 2.0000 | INHALATION_SPRAY | Freq: Four times a day (QID) | RESPIRATORY_TRACT | Status: DC | PRN

## 2015-08-09 NOTE — Progress Notes (Signed)
Subjective:    Patient ID: [name], female    DOB: 1946/01/28, 70 y.o.   MRN: [id number]  HPI  06/05/14  patient presents with 2 days of left lower quadrant abdominal pain that radiates from her left lower abdomen into her back. She is tender to palpation in the left lower quadrant. She has a history of hysterectomy. She denies any melena or hematochezia. The pain is worse with food. She denies any nausea or vomiting. She denies any dysuria or hematuria. She has no CVA tenderness. At that time, my plan was:  I believe the patient has diverticulitis. Begin Cipro 500 mg by mouth twice a day and Flagyl 500 mg by mouth twice a day for 14 days. Recheck in 48 hours if no better proceed with a CT scan. Seek medical attention immediately if worse.  08/09/15 Pain gradually improved last time but has returned over the last month.  She reports pain in the left lower quadrant and also on the left side of her abdomen. It comes and goes without reason. It can hurt with twisting or turning back and also her when she is sitting still. She denies any blood in her stool. She denies any fever. She denies any diarrhea. The pain is mild to moderate, a 3 to a 5 on a scale of 10. However it is not getting better. She denies any dysuria or hematuria or frequency. She denies any blood in her stool. She denies any black tarry stools. She denies any fever. She denies any constipation or diarrhea. She does have a history of severe degenerative disc disease in lumbar spine and some is possible he could be referred pain as the pain radiates from her left lower quadrant into her left mid back. She also has a history of a hysterectomy and surgery to remove ovarian cyst on that side so as possible this could be from adhesions and scar tissue Past Medical History  Diagnosis Date  . Murmur, heart   . Hyperlipidemia   . Hypertension   . Colon polyps     benign- last colonoscopy 09-2008  . H/O seasonal allergies     no problems  now  . Arthritis     arthritis -knees   Past Surgical History  Procedure Laterality Date  . Abdominal hysterectomy    . Cholecystectomy      open-gallstones  . Appendectomy      with gallbladder surgery  . Oophorectomy Bilateral 2008    ovarian cyst  . Bunionectomy Bilateral   . Excision of adnexal mass Left August 03, 2013    at [facility]/right adnexal nodule,excision;benigh lymph node   Current Outpatient Prescriptions on File Prior to Visit  Medication Sig Dispense Refill  . diltiazem (CARDIZEM) 120 MG tablet TAKE ONE TABLET BY MOUTH THREE TIMES DAILY 270 tablet 0  . hydrochlorothiazide (HYDRODIURIL) 25 MG tablet TAKE ONE TABLET BY MOUTH ONCE DAILY 90 tablet 0  . hydrOXYzine (ATARAX/VISTARIL) 50 MG tablet Take 0.5 tablets (25 mg total) by mouth every 6 (six) hours as needed for itching. 30 tablet 0  . lisinopril (PRINIVIL,ZESTRIL) 20 MG tablet TAKE TWO TABLETS BY MOUTH DAILY IN THE MORNING 180 tablet 0  . Multiple Vitamins-Minerals (HAIR/SKIN/NAILS PO) Take 1 tablet by mouth daily.    . naproxen (NAPROSYN) 500 MG tablet Take 500 mg by mouth 2 (two) times daily as needed for mild pain.    . polyethylene glycol (MIRALAX / GLYCOLAX) packet Take 17 g by mouth as  needed.    Marland Kitchen tetrahydrozoline 0.05 % ophthalmic solution Place 1 drop into both eyes as needed (eye allergies).     No current facility-administered medications on file prior to visit.   No Known Allergies Social History   Social History  . Marital Status: Married    Spouse Name: N/A  . Number of Children: N/A  . Years of Education: N/A   Occupational History  . Not on file.   Social History Main Topics  . Smoking status: Never Smoker   . Smokeless tobacco: Never Used  . Alcohol Use: No  . Drug Use: No  . Sexual Activity: Yes   Other Topics Concern  . Not on file   Social History Narrative     Review of Systems  All other systems reviewed and are negative.      Objective:   Physical Exam    Cardiovascular: Normal rate, regular rhythm and normal heart sounds.   No murmur heard. Pulmonary/Chest: Effort normal and breath sounds normal. No respiratory distress. She has no wheezes. She has no rales.  Abdominal: Soft. She exhibits no distension and no mass. There is tenderness. There is no rebound and no guarding.  Vitals reviewed.         Assessment & Plan:  Abdominal pain, left lower quadrant - Plan: CBC with Differential/Platelet, COMPLETE METABOLIC PANEL WITH GFR  Wheezing - Plan: albuterol (PROVENTIL HFA;VENTOLIN HFA) 108 (90 Base) MCG/ACT inhaler  I believe the patient's abdominal pain is due to adhesions. Diverticulitis is also a possibility. I will obtain a CBC as well as a CMP. I will also obtain a CT scan of the abdomen and pelvis given her history of pelvic mass, ovarian cyst, and small bowel obstruction. However I believe the adhesions are what is likely causing her abdominal pain

## 2015-08-17 ENCOUNTER — Telehealth: Payer: Self-pay | Admitting: Family Medicine

## 2015-08-17 NOTE — Telephone Encounter (Signed)
[phone] Patient saying that her albuterol is too expensive at [facility] [address], would like a call back regarding this

## 2015-08-20 MED ORDER — ALBUTEROL SULFATE HFA 108 (90 BASE) MCG/ACT IN AERS: 2.0000 | INHALATION_SPRAY | Freq: Four times a day (QID) | RESPIRATORY_TRACT | 3 refills | Status: DC | PRN

## 2015-08-20 NOTE — Telephone Encounter (Signed)
Medication called/sent to requested pharmacy and pt aware 

## 2015-08-23 ENCOUNTER — Other Ambulatory Visit: Payer: Self-pay | Admitting: *Deleted

## 2015-08-31 ENCOUNTER — Ambulatory Visit
Admission: RE | Admit: 2015-08-31 | Discharge: 2015-08-31 | Disposition: A | Discharge status: Home / Self Care | Payer: Medicare Other | Source: Ambulatory Visit | Attending: Family Medicine | Admitting: Family Medicine

## 2015-08-31 DIAGNOSIS — R1032 Left lower quadrant pain: Secondary | ICD-10-CM

## 2015-08-31 DIAGNOSIS — K5730 Diverticulosis of large intestine without perforation or abscess without bleeding: Secondary | ICD-10-CM | POA: Diagnosis not present

## 2015-08-31 MED ORDER — IOPAMIDOL (ISOVUE-300) INJECTION 61%
125.0000 mL | Freq: Once | INTRAVENOUS | Status: AC | PRN
  Administered 2015-08-31: 125 mL via INTRAVENOUS

## 2015-09-03 ENCOUNTER — Other Ambulatory Visit: Payer: Self-pay | Admitting: Family Medicine

## 2015-09-03 MED ORDER — METRONIDAZOLE 500 MG PO TABS: 500.0000 mg | ORAL_TABLET | Freq: Three times a day (TID) | ORAL | 0 refills | Status: DC

## 2015-09-03 MED ORDER — CIPROFLOXACIN HCL 500 MG PO TABS: 500.0000 mg | ORAL_TABLET | Freq: Two times a day (BID) | ORAL | 0 refills | Status: DC

## 2015-09-07 ENCOUNTER — Other Ambulatory Visit: Payer: Self-pay | Admitting: Family Medicine

## 2015-10-16 ENCOUNTER — Ambulatory Visit: Payer: Medicare Other | Admitting: Family Medicine

## 2015-10-29 ENCOUNTER — Ambulatory Visit (INDEPENDENT_AMBULATORY_CARE_PROVIDER_SITE_OTHER): Payer: Medicare Other | Admitting: Family Medicine

## 2015-10-29 ENCOUNTER — Encounter: Payer: Self-pay | Admitting: Family Medicine

## 2015-10-29 VITALS — BP 122/86 | HR 72 | Temp 97.6°F | Resp 18 | Ht 64.0 in | Wt 254.0 lb

## 2015-10-29 DIAGNOSIS — Z13220 Encounter for screening for lipoid disorders: Secondary | ICD-10-CM

## 2015-10-29 DIAGNOSIS — L609 Nail disorder, unspecified: Secondary | ICD-10-CM | POA: Diagnosis not present

## 2015-10-29 DIAGNOSIS — Z79899 Other long term (current) drug therapy: Secondary | ICD-10-CM | POA: Diagnosis not present

## 2015-10-29 DIAGNOSIS — Z0000 Encounter for general adult medical examination without abnormal findings: Secondary | ICD-10-CM

## 2015-10-29 LAB — LIPID PANEL
Cholesterol: 186 mg/dL (ref 125–200)
HDL: 45 mg/dL — ABNORMAL LOW
LDL Cholesterol: 121 mg/dL
Total CHOL/HDL Ratio: 4.1 ratio
Triglycerides: 99 mg/dL
VLDL: 20 mg/dL

## 2015-10-29 LAB — TSH: TSH: 1.88 m[IU]/L

## 2015-10-29 NOTE — Progress Notes (Signed)
Subjective:    Patient ID: [name], female    DOB: 12/15/1945, 70 y.o.   MRN: [id number]  HPI Patient is here today for complete physical exam. Mammogram was in February and was normal. Colonoscopy was performed in 2015 and is due again in 2020. Bone density was performed in 2014 and was outstanding. Past medical history is significant for hysterectomy performed for noncancerous reasons and therefore she does not require a Pap smear. Had a CBC and CMP checked in July that were normal. She is due for a lipid panel as well as a TSH. She is due today for a flu shot, shingles vaccine, and tetanus shot. She politely declines all of these. Pneumovax and Prevnar are up-to-date. Of note she has an abnormal fingernail on her right thumb. It appears that there is a red soft spongy subungual mass near the cuticle causing the fingernail to grow dystrophic. I believe that this could be an angioma underneath this fingernail but I cannot be certain. I recommended a biopsy to rule out malignancy. She would like to see a dermatologist for a second opinion. Past Medical History:  Diagnosis Date  . Arthritis    arthritis -knees  . Colon polyps    benign- last colonoscopy 09-2008  . H/O seasonal allergies    no problems now  . Hyperlipidemia   . Hypertension   . Murmur, heart    Past Surgical History:  Procedure Laterality Date  . ABDOMINAL HYSTERECTOMY    . APPENDECTOMY     with gallbladder surgery  . BUNIONECTOMY Bilateral   . CHOLECYSTECTOMY     open-gallstones  . EXCISION OF ADNEXAL MASS Left August 03, 2013   at [facility]/right adnexal nodule,excision;benigh lymph node  . OOPHORECTOMY Bilateral 2008   ovarian cyst   Current Outpatient Prescriptions on File Prior to Visit  Medication Sig Dispense Refill  . albuterol (PROAIR HFA) 108 (90 Base) MCG/ACT inhaler Inhale 2 puffs into the lungs every 6 (six) hours as needed for wheezing or shortness of breath. 1 Inhaler 3  . ciprofloxacin (CIPRO)  500 MG tablet Take 1 tablet (500 mg total) by mouth 2 (two) times daily. 20 tablet 0  . diltiazem (CARDIZEM) 120 MG tablet TAKE ONE TABLET BY MOUTH THREE TIMES DAILY 270 tablet 0  . hydrochlorothiazide (HYDRODIURIL) 25 MG tablet TAKE ONE TABLET BY MOUTH ONCE DAILY 90 tablet 0  . hydrOXYzine (ATARAX/VISTARIL) 50 MG tablet Take 0.5 tablets (25 mg total) by mouth every 6 (six) hours as needed for itching. 30 tablet 0  . lisinopril (PRINIVIL,ZESTRIL) 20 MG tablet TAKE TWO TABLETS BY MOUTH ONCE DAILY IN THE MORNING 180 tablet 3  . metroNIDAZOLE (FLAGYL) 500 MG tablet Take 1 tablet (500 mg total) by mouth 3 (three) times daily. 30 tablet 0  . Multiple Vitamins-Minerals (HAIR/SKIN/NAILS PO) Take 1 tablet by mouth daily.    . naproxen (NAPROSYN) 500 MG tablet Take 500 mg by mouth 2 (two) times daily as needed for mild pain.    . polyethylene glycol (MIRALAX / GLYCOLAX) packet Take 17 g by mouth as needed.    Marland Kitchen tetrahydrozoline 0.05 % ophthalmic solution Place 1 drop into both eyes as needed (eye allergies).     No current facility-administered medications on file prior to visit.    No Known Allergies Social History   Social History  . Marital status: Married    Spouse name: N/A  . Number of children: N/A  . Years of education: N/A  Occupational History  . Not on file.   Social History Main Topics  . Smoking status: Never Smoker  . Smokeless tobacco: Never Used  . Alcohol use No  . Drug use: No  . Sexual activity: Yes   Other Topics Concern  . Not on file   Social History Narrative  . No narrative on file   Family History  Problem Relation Age of Onset  . Colon cancer Mother 97  . Heart attack Father   . Esophageal cancer Neg Hx   . Pancreatic cancer Neg Hx   . Rectal cancer Neg Hx   . Stomach cancer Neg Hx       Review of Systems  All other systems reviewed and are negative.      Objective:   Physical Exam  Constitutional: She is oriented to person, place, and time.  She appears well-developed and well-nourished. No distress.  HENT:  Head: Normocephalic and atraumatic.  Right Ear: External ear normal.  Left Ear: External ear normal.  Nose: Nose normal.  Mouth/Throat: Oropharynx is clear and moist. No oropharyngeal exudate.  Eyes: Conjunctivae and EOM are normal. Pupils are equal, round, and reactive to light. Right eye exhibits no discharge. Left eye exhibits no discharge. No scleral icterus.  Neck: Normal range of motion. Neck supple. No JVD present. No tracheal deviation present. No thyromegaly present.  Cardiovascular: Normal rate, regular rhythm and intact distal pulses.  Exam reveals no gallop and no friction rub.   Murmur heard. Pulmonary/Chest: Effort normal and breath sounds normal. No stridor. No respiratory distress. She has no wheezes. She has no rales. She exhibits no tenderness.  Abdominal: Soft. Bowel sounds are normal. She exhibits no distension and no mass. There is no tenderness. There is no rebound and no guarding.  Musculoskeletal: Normal range of motion. She exhibits no edema, tenderness or deformity.  Lymphadenopathy:    She has no cervical adenopathy.  Neurological: She is alert and oriented to person, place, and time. She has normal reflexes. She displays normal reflexes. No cranial nerve deficit. She exhibits normal muscle tone. Coordination normal.  Skin: Skin is warm. No rash noted. She is not diaphoretic. No erythema. No pallor.  Psychiatric: She has a normal mood and affect. Her behavior is normal. Judgment and thought content normal.  Vitals reviewed.         Assessment & Plan:  Screening cholesterol level - Plan: Lipid panel  Routine general medical examination at a health care facility - Plan: TSH  Fingernail abnormalities - Plan: Ambulatory referral to Dermatology  Physical exam today is completely normal outside the abnormal right thumb fingernail. Blood pressures excellent. Lab work from July was outstanding. I  will check a lipid panel as well as a TSH. Bone density and mammogram are up-to-date. Colonoscopy is up-to-date. Pap smear is not due. Recommended flu shot, shingles vaccine, and tetanus shot but the patient politely declined. I will consult dermatology but I believe there is a subungual mass causing the finger nail to grow abnormally. Based on the reddish hue and the soft spongy nature I believe is likely an angioma

## 2015-10-30 ENCOUNTER — Encounter: Payer: Self-pay | Admitting: Family Medicine

## 2015-11-06 ENCOUNTER — Ambulatory Visit (INDEPENDENT_AMBULATORY_CARE_PROVIDER_SITE_OTHER): Payer: Medicare Other | Admitting: Family Medicine

## 2015-11-06 DIAGNOSIS — Z23 Encounter for immunization: Secondary | ICD-10-CM | POA: Diagnosis not present

## 2015-11-21 DIAGNOSIS — L603 Nail dystrophy: Secondary | ICD-10-CM | POA: Diagnosis not present

## 2015-11-21 DIAGNOSIS — L309 Dermatitis, unspecified: Secondary | ICD-10-CM | POA: Diagnosis not present

## 2015-11-21 DIAGNOSIS — L400 Psoriasis vulgaris: Secondary | ICD-10-CM | POA: Diagnosis not present

## 2015-11-30 DIAGNOSIS — L400 Psoriasis vulgaris: Secondary | ICD-10-CM | POA: Diagnosis not present

## 2015-11-30 DIAGNOSIS — L603 Nail dystrophy: Secondary | ICD-10-CM | POA: Diagnosis not present

## 2016-01-15 ENCOUNTER — Other Ambulatory Visit: Payer: Self-pay | Admitting: Family Medicine

## 2016-02-22 ENCOUNTER — Ambulatory Visit (INDEPENDENT_AMBULATORY_CARE_PROVIDER_SITE_OTHER): Payer: Medicare Other | Admitting: Podiatry

## 2016-02-22 ENCOUNTER — Encounter: Payer: Self-pay | Admitting: Podiatry

## 2016-02-22 ENCOUNTER — Ambulatory Visit (INDEPENDENT_AMBULATORY_CARE_PROVIDER_SITE_OTHER): Payer: Medicare Other

## 2016-02-22 DIAGNOSIS — M79672 Pain in left foot: Secondary | ICD-10-CM

## 2016-02-22 DIAGNOSIS — M7750 Other enthesopathy of unspecified foot: Secondary | ICD-10-CM | POA: Diagnosis not present

## 2016-02-22 DIAGNOSIS — M779 Enthesopathy, unspecified: Secondary | ICD-10-CM

## 2016-02-22 DIAGNOSIS — M79671 Pain in right foot: Secondary | ICD-10-CM | POA: Diagnosis not present

## 2016-02-22 MED ORDER — TRIAMCINOLONE ACETONIDE 10 MG/ML IJ SUSP
10.0000 mg | Freq: Once | INTRAMUSCULAR | Status: AC
  Administered 2016-02-22: 10 mg

## 2016-02-23 NOTE — Progress Notes (Signed)
Subjective:     Patient ID: [name], female   DOB: December 24, 1945, 72 y.o.   MRN: [id number]  HPI patient presents stating she has a lot of pain in the plantar right heel outside left foot and that both problems and been present for several months   Review of Systems  All other systems reviewed and are negative.      Objective:   Physical Exam  Constitutional: She is oriented to person, place, and time.  Cardiovascular: Intact distal pulses.   Musculoskeletal: Normal range of motion.  Neurological: She is oriented to person, place, and time.  Skin: Skin is warm.  Nursing note and vitals reviewed.  neurovascular status intact muscle strength was adequate range of motion within normal limits with patient found to have inflammation pain in the right heel and in the left foot I noted there to be discomfort in the lateral side of the foot. Patient's good digital perfusion well oriented 3     Assessment:     Tori tendinitis with fasciitis right and tendinitis left    Plan:     H&P both conditions discussed and injected plantar fascial right 3 Milligan Kenalog 5 g Xylocaine and the left lateral tendon complex 3 mg Kenalog 5 mill grams Xylocaine. Gave instructions on physical therapy anti-inflammatory and reviewed x-rays and dispensed fascial brace  X-ray report was negative for signs of fracture did indicate spur formation with moderate depression of the arch

## 2016-02-26 ENCOUNTER — Other Ambulatory Visit: Payer: Self-pay | Admitting: Family Medicine

## 2016-02-26 DIAGNOSIS — Z1231 Encounter for screening mammogram for malignant neoplasm of breast: Secondary | ICD-10-CM

## 2016-03-24 ENCOUNTER — Other Ambulatory Visit: Payer: Self-pay | Admitting: Family Medicine

## 2016-03-26 ENCOUNTER — Ambulatory Visit
Admission: RE | Admit: 2016-03-26 | Discharge: 2016-03-26 | Disposition: A | Discharge status: Home / Self Care | Payer: Medicare Other | Source: Ambulatory Visit | Attending: Family Medicine | Admitting: Family Medicine

## 2016-03-26 DIAGNOSIS — Z1231 Encounter for screening mammogram for malignant neoplasm of breast: Secondary | ICD-10-CM

## 2016-04-17 ENCOUNTER — Ambulatory Visit (INDEPENDENT_AMBULATORY_CARE_PROVIDER_SITE_OTHER): Payer: Medicare Other | Admitting: Family Medicine

## 2016-04-17 ENCOUNTER — Encounter: Payer: Self-pay | Admitting: Family Medicine

## 2016-04-17 VITALS — BP 142/90 | HR 80 | Temp 97.8°F | Resp 18 | Ht 64.0 in | Wt 262.0 lb

## 2016-04-17 DIAGNOSIS — M5416 Radiculopathy, lumbar region: Secondary | ICD-10-CM | POA: Diagnosis not present

## 2016-04-17 DIAGNOSIS — M7989 Other specified soft tissue disorders: Secondary | ICD-10-CM | POA: Diagnosis not present

## 2016-04-17 DIAGNOSIS — I10 Essential (primary) hypertension: Secondary | ICD-10-CM | POA: Diagnosis not present

## 2016-04-17 MED ORDER — FUROSEMIDE 40 MG PO TABS: 40.0000 mg | ORAL_TABLET | Freq: Every day | ORAL | 3 refills | Status: DC

## 2016-04-17 NOTE — Progress Notes (Signed)
Subjective:    Patient ID: [name], female    DOB: October 27, 1945, 71 y.o.   MRN: [id number]  HPI Patient presents today for follow-up of hypertension. She is currently on Cardizem hydrochlorothiazide. Recently began noticing significant swelling in both legs. She has trace to +1 pitting edema in both legs distal to the mid shin. This is been going on for a proximally 1 week. She also reports numbness and tingling and pain radiating from her back into her left groin and down her left anterior thigh in a dermatomal pattern. This is been off and on for several months. Past Medical History:  Diagnosis Date  . Arthritis    arthritis -knees  . Colon polyps    benign- last colonoscopy 09-2008  . H/O seasonal allergies    no problems now  . Hyperlipidemia   . Hypertension   . Murmur, heart    Past Surgical History:  Procedure Laterality Date  . ABDOMINAL HYSTERECTOMY    . APPENDECTOMY     with gallbladder surgery  . BUNIONECTOMY Bilateral   . CHOLECYSTECTOMY     open-gallstones  . EXCISION OF ADNEXAL MASS Left August 03, 2013   at [facility]/right adnexal nodule,excision;benigh lymph node  . OOPHORECTOMY Bilateral 2008   ovarian cyst   Current Outpatient Prescriptions on File Prior to Visit  Medication Sig Dispense Refill  . albuterol (PROAIR HFA) 108 (90 Base) MCG/ACT inhaler Inhale 2 puffs into the lungs every 6 (six) hours as needed for wheezing or shortness of breath. 1 Inhaler 3  . diltiazem (CARDIZEM) 120 MG tablet TAKE ONE TABLET BY MOUTH THREE TIMES DAILY 270 tablet 0  . hydrochlorothiazide (HYDRODIURIL) 25 MG tablet TAKE ONE TABLET BY MOUTH ONCE DAILY 90 tablet 0  . lisinopril (PRINIVIL,ZESTRIL) 20 MG tablet TAKE TWO TABLETS BY MOUTH ONCE DAILY IN THE MORNING 180 tablet 3  . Multiple Vitamins-Minerals (HAIR/SKIN/NAILS PO) Take 1 tablet by mouth daily.    . naproxen (NAPROSYN) 500 MG tablet Take 500 mg by mouth 2 (two) times daily as needed for mild pain.    . polyethylene  glycol (MIRALAX / GLYCOLAX) packet Take 17 g by mouth as needed.    Marland Kitchen tetrahydrozoline 0.05 % ophthalmic solution Place 1 drop into both eyes as needed (eye allergies).     No current facility-administered medications on file prior to visit.    No Known Allergies Social History   Social History  . Marital status: Married    Spouse name: N/A  . Number of children: N/A  . Years of education: N/A   Occupational History  . Not on file.   Social History Main Topics  . Smoking status: Never Smoker  . Smokeless tobacco: Never Used  . Alcohol use No  . Drug use: No  . Sexual activity: Yes   Other Topics Concern  . Not on file   Social History Narrative  . No narrative on file      Review of Systems  All other systems reviewed and are negative.      Objective:   Physical Exam  Constitutional: She appears well-developed and well-nourished.  Neck: No JVD present.  Cardiovascular: Normal rate and regular rhythm.   Murmur heard. Pulmonary/Chest: Effort normal and breath sounds normal. No respiratory distress. She has no wheezes. She has no rales.  Abdominal: Soft. Bowel sounds are normal. She exhibits no distension. There is no tenderness. There is no rebound and no guarding.  Musculoskeletal: She exhibits edema.  Vitals  reviewed.         Assessment & Plan:  Left lumbar radiculopathy - Plan: DG Lumbar Spine Complete  Benign essential HTN - Plan: CBC with Differential/Platelet, COMPLETE METABOLIC PANEL WITH GFR  Leg swelling - Plan: furosemide (LASIX) 40 MG tablet  I'm concerned the patient has left lumbar radiculopathy. Begin with an x-ray of the lumbar spine to evaluate for degenerative disc disease. Her blood pressure today is marginal but the patient appears fluid overloaded. Her weight is up almost 6 pounds from her last visit. Discontinue hydrochlorothiazide and replace with Lasix 40 mg a day. Recheck in one week. Check CBC and CMP. Patient's murmur is louder  today. It is 3/6. His swelling does not improve, on Lasix, I would repeat echocardiogram of the heart

## 2016-04-18 ENCOUNTER — Ambulatory Visit
Admission: RE | Admit: 2016-04-18 | Discharge: 2016-04-18 | Disposition: A | Discharge status: Home / Self Care | Payer: Medicare Other | Source: Ambulatory Visit | Attending: Family Medicine | Admitting: Family Medicine

## 2016-04-18 DIAGNOSIS — M5416 Radiculopathy, lumbar region: Secondary | ICD-10-CM

## 2016-04-18 DIAGNOSIS — M47816 Spondylosis without myelopathy or radiculopathy, lumbar region: Secondary | ICD-10-CM | POA: Diagnosis not present

## 2016-04-18 LAB — CBC WITH DIFFERENTIAL/PLATELET
Basophils Absolute: 80 cells/uL (ref 0–200)
Basophils Relative: 1 %
Eosinophils Absolute: 320 cells/uL (ref 15–500)
Eosinophils Relative: 4 %
HCT: 41.2 % (ref 35.0–45.0)
HEMOGLOBIN: 14 g/dL (ref 12.0–15.0)
LYMPHS PCT: 26 %
Lymphs Abs: 2080 cells/uL (ref 850–3900)
MCH: 29.4 pg (ref 27.0–33.0)
MCHC: 34 g/dL (ref 32.0–36.0)
MCV: 86.4 fL (ref 80.0–100.0)
MONO ABS: 640 {cells}/uL (ref 200–950)
MPV: 11.8 fL (ref 7.5–12.5)
Monocytes Relative: 8 %
NEUTROS PCT: 61 %
Neutro Abs: 4880 cells/uL (ref 1500–7800)
PLATELETS: 242 10*3/uL (ref 140–400)
RBC: 4.77 MIL/uL (ref 3.80–5.10)
RDW: 13.8 % (ref 11.0–15.0)
WBC: 8 10*3/uL (ref 3.8–10.8)

## 2016-04-18 LAB — COMPLETE METABOLIC PANEL WITH GFR
ALBUMIN: 4.1 g/dL (ref 3.6–5.1)
ALK PHOS: 52 U/L (ref 33–130)
ALT: 13 U/L (ref 6–29)
AST: 19 U/L (ref 10–35)
BILIRUBIN TOTAL: 0.5 mg/dL (ref 0.2–1.2)
BUN: 13 mg/dL (ref 7–25)
CO2: 28 mmol/L (ref 20–31)
CREATININE: 0.86 mg/dL (ref 0.50–0.99)
Calcium: 9 mg/dL (ref 8.6–10.4)
Chloride: 99 mmol/L (ref 98–110)
GFR, Est African American: 80 mL/min (ref >=60)
GFR, Est Non African American: 70 mL/min (ref >=60)
GLUCOSE: 112 mg/dL — AB (ref 70–99)
Potassium: 3.4 mmol/L — ABNORMAL LOW (ref 3.5–5.3)
SODIUM: 140 mmol/L (ref 135–146)
TOTAL PROTEIN: 6.7 g/dL (ref 6.1–8.1)

## 2016-05-07 ENCOUNTER — Other Ambulatory Visit: Payer: Self-pay | Admitting: Family Medicine

## 2016-06-13 DIAGNOSIS — H25099 Other age-related incipient cataract, unspecified eye: Secondary | ICD-10-CM | POA: Diagnosis not present

## 2016-07-31 DIAGNOSIS — Z01419 Encounter for gynecological examination (general) (routine) without abnormal findings: Secondary | ICD-10-CM | POA: Diagnosis not present

## 2016-08-07 ENCOUNTER — Other Ambulatory Visit: Payer: Self-pay | Admitting: Obstetrics and Gynecology

## 2016-08-07 DIAGNOSIS — E2839 Other primary ovarian failure: Secondary | ICD-10-CM

## 2016-08-20 ENCOUNTER — Telehealth: Payer: Self-pay | Admitting: Family Medicine

## 2016-08-20 MED ORDER — LORAZEPAM 0.5 MG PO TABS: 0.5000 mg | ORAL_TABLET | Freq: Every evening | ORAL | 0 refills | Status: DC | PRN

## 2016-08-20 NOTE — Telephone Encounter (Signed)
Pt called and LMOVM stating that she just lost her son and would like to know if we could call her in something to help her sleep?

## 2016-08-20 NOTE — Telephone Encounter (Signed)
Ativan 0.5mg  1 po QHS prn insomnia #30

## 2016-08-20 NOTE — Telephone Encounter (Signed)
Medication called/sent to requested pharmacy - pt aware via vm 

## 2016-08-29 ENCOUNTER — Other Ambulatory Visit: Payer: Self-pay | Admitting: Family Medicine

## 2016-08-29 DIAGNOSIS — M7989 Other specified soft tissue disorders: Secondary | ICD-10-CM

## 2016-09-01 ENCOUNTER — Ambulatory Visit
Admission: RE | Admit: 2016-09-01 | Discharge: 2016-09-01 | Disposition: A | Discharge status: Home / Self Care | Payer: Medicare Other | Source: Ambulatory Visit | Attending: Obstetrics and Gynecology | Admitting: Obstetrics and Gynecology

## 2016-09-01 DIAGNOSIS — Z780 Asymptomatic menopausal state: Secondary | ICD-10-CM | POA: Diagnosis not present

## 2016-09-01 DIAGNOSIS — E2839 Other primary ovarian failure: Secondary | ICD-10-CM

## 2016-09-05 ENCOUNTER — Other Ambulatory Visit: Payer: Self-pay | Admitting: Family Medicine

## 2016-11-03 ENCOUNTER — Encounter: Payer: Self-pay | Admitting: Family Medicine

## 2016-11-03 ENCOUNTER — Ambulatory Visit (INDEPENDENT_AMBULATORY_CARE_PROVIDER_SITE_OTHER): Payer: Medicare Other | Admitting: Family Medicine

## 2016-11-03 VITALS — BP 148/82 | HR 79 | Temp 97.9°F | Resp 16 | Ht 63.5 in | Wt 260.0 lb

## 2016-11-03 DIAGNOSIS — Z0000 Encounter for general adult medical examination without abnormal findings: Secondary | ICD-10-CM

## 2016-11-03 DIAGNOSIS — I10 Essential (primary) hypertension: Secondary | ICD-10-CM

## 2016-11-03 DIAGNOSIS — Z13220 Encounter for screening for lipoid disorders: Secondary | ICD-10-CM | POA: Diagnosis not present

## 2016-11-03 DIAGNOSIS — Z23 Encounter for immunization: Secondary | ICD-10-CM

## 2016-11-03 LAB — COMPLETE METABOLIC PANEL WITH GFR
AG RATIO: 1.4 (calc) (ref 1.0–2.5)
ALBUMIN MSPROF: 3.9 g/dL (ref 3.6–5.1)
ALKALINE PHOSPHATASE (APISO): 57 U/L (ref 33–130)
ALT: 14 U/L (ref 6–29)
AST: 18 U/L (ref 10–35)
BILIRUBIN TOTAL: 0.5 mg/dL (ref 0.2–1.2)
BUN: 12 mg/dL (ref 7–25)
CHLORIDE: 101 mmol/L (ref 98–110)
CO2: 30 mmol/L (ref 20–32)
Calcium: 8.8 mg/dL (ref 8.6–10.4)
Creat: 0.69 mg/dL (ref 0.50–0.99)
GFR, EST AFRICAN AMERICAN: 103 mL/min/{1.73_m2} (ref >=60)
GFR, Est Non African American: 89 mL/min/{1.73_m2} (ref >=60)
GLUCOSE: 99 mg/dL (ref 65–99)
Globulin: 2.7 g/dL (calc) (ref 1.9–3.7)
POTASSIUM: 3.7 mmol/L (ref 3.5–5.3)
Sodium: 142 mmol/L (ref 135–146)
Total Protein: 6.6 g/dL (ref 6.1–8.1)

## 2016-11-03 LAB — LIPID PANEL
CHOLESTEROL: 194 mg/dL (ref <200)
HDL: 50 mg/dL — ABNORMAL LOW (ref >50)
LDL CHOLESTEROL (CALC): 124 mg/dL — AB
Non-HDL Cholesterol (Calc): 144 mg/dL (calc) — ABNORMAL HIGH (ref <130)
Total CHOL/HDL Ratio: 3.9 (calc) (ref <5.0)
Triglycerides: 96 mg/dL (ref <150)

## 2016-11-03 MED ORDER — CYCLOBENZAPRINE HCL 10 MG PO TABS: 10.0000 mg | ORAL_TABLET | Freq: Three times a day (TID) | ORAL | 0 refills | Status: DC | PRN

## 2016-11-03 NOTE — Progress Notes (Signed)
Subjective:    Patient ID: [name], female    DOB: Nov 01, 1945, 71 y.o.   MRN: [id number]  HPI Patient is here today for complete physical exam. Mammogram was earlier this year and was normal. Colonoscopy was performed in 2015 and is due again in 2020. Bone density was performed in 08/2016 and showed no osteoporosis or osteopenia.  Past medical history is significant for hysterectomy performed for noncancerous reasons and therefore she does not require a Pap smear. She is due today for a flu shot, shingles vaccine.  Pneumovax and Prevnar are up-to-date.  Unfortunately, her son died from heart attack in 09-03-22. She is still grieving his loss. She has symptoms concerning for depression but I believe this is due to grieving the loss of a child. She reports sadness, trouble sleeping, frequent crying spells. She denies any suicidal ideation. She does complain of pain in the left side of her neck radiating into her left shoulder. She complains of muscles deafness and muscle tightness in this area. She has normal range of motion without pain in the left shoulder. She has normal reflexes in the left arm. She has normal sensation in the left arm. She has a negative Spurling sign. She has a negative empty can sign. She does have tenderness to palpation in the trapezius on the left side. I believe she is doing with muscle stiffness and muscle tightness most likely due to poor body habitus and poor sleep. I recommended trying Flexeril 10 mg by mouth daily at bedtime as a muscle relaxer area patient has not been using Xanax due to her fear of addiction. I believe the Flexeril may also help her sleep at night. Past Medical History:  Diagnosis Date  . Arthritis    arthritis -knees  . Colon polyps    benign- last colonoscopy 09-2008  . H/O seasonal allergies    no problems now  . Hyperlipidemia   . Hypertension   . Murmur, heart    Past Surgical History:  Procedure Laterality Date  . ABDOMINAL HYSTERECTOMY      . APPENDECTOMY     with gallbladder surgery  . BUNIONECTOMY Bilateral   . CHOLECYSTECTOMY     open-gallstones  . EXCISION OF ADNEXAL MASS Left August 03, 2013   at [facility]/right adnexal nodule,excision;benigh lymph node  . OOPHORECTOMY Bilateral 2008   ovarian cyst   Current Outpatient Prescriptions on File Prior to Visit  Medication Sig Dispense Refill  . albuterol (PROAIR HFA) 108 (90 Base) MCG/ACT inhaler Inhale 2 puffs into the lungs every 6 (six) hours as needed for wheezing or shortness of breath. 1 Inhaler 3  . diltiazem (CARDIZEM) 120 MG tablet TAKE ONE TABLET BY MOUTH THREE TIMES DAILY 270 tablet 0  . furosemide (LASIX) 40 MG tablet TAKE 1 TABLET BY MOUTH ONCE DAILY 30 tablet 3  . hydrochlorothiazide (HYDRODIURIL) 25 MG tablet TAKE ONE TABLET BY MOUTH ONCE DAILY 90 tablet 0  . lisinopril (PRINIVIL,ZESTRIL) 20 MG tablet TAKE TWO TABLETS BY MOUTH ONCE DAILY IN THE MORNING 180 tablet 3  . LORazepam (ATIVAN) 0.5 MG tablet Take 1 tablet (0.5 mg total) by mouth at bedtime as needed for sleep. 30 tablet 0  . Multiple Vitamins-Minerals (HAIR/SKIN/NAILS PO) Take 1 tablet by mouth daily.    . naproxen (NAPROSYN) 500 MG tablet Take 500 mg by mouth 2 (two) times daily as needed for mild pain.    . polyethylene glycol (MIRALAX / GLYCOLAX) packet Take 17 g by mouth as  needed.    Marland Kitchen tetrahydrozoline 0.05 % ophthalmic solution Place 1 drop into both eyes as needed (eye allergies).     No current facility-administered medications on file prior to visit.    No Known Allergies Social History   Social History  . Marital status: Married    Spouse name: N/A  . Number of children: N/A  . Years of education: N/A   Occupational History  . Not on file.   Social History Main Topics  . Smoking status: Never Smoker  . Smokeless tobacco: Never Used  . Alcohol use No  . Drug use: No  . Sexual activity: Yes   Other Topics Concern  . Not on file   Social History Narrative  . No narrative on  file   Family History  Problem Relation Age of Onset  . Colon cancer Mother 10  . Heart attack Father   . Breast cancer Paternal Aunt   . Esophageal cancer Neg Hx   . Pancreatic cancer Neg Hx   . Rectal cancer Neg Hx   . Stomach cancer Neg Hx       Review of Systems  All other systems reviewed and are negative.      Objective:   Physical Exam  Constitutional: She is oriented to person, place, and time. She appears well-developed and well-nourished. No distress.  HENT:  Head: Normocephalic and atraumatic.  Right Ear: External ear normal.  Left Ear: External ear normal.  Nose: Nose normal.  Mouth/Throat: Oropharynx is clear and moist. No oropharyngeal exudate.  Eyes: Pupils are equal, round, and reactive to light. Conjunctivae and EOM are normal. Right eye exhibits no discharge. Left eye exhibits no discharge. No scleral icterus.  Neck: Normal range of motion. Neck supple. No JVD present. No tracheal deviation present. No thyromegaly present.  Cardiovascular: Normal rate, regular rhythm and intact distal pulses.  Exam reveals no gallop and no friction rub.   Murmur heard. Pulmonary/Chest: Effort normal and breath sounds normal. No stridor. No respiratory distress. She has no wheezes. She has no rales. She exhibits no tenderness.  Abdominal: Soft. Bowel sounds are normal. She exhibits no distension and no mass. There is no tenderness. There is no rebound and no guarding.  Musculoskeletal: Normal range of motion. She exhibits no edema, tenderness or deformity.  Lymphadenopathy:    She has no cervical adenopathy.  Neurological: She is alert and oriented to person, place, and time. She has normal reflexes. No cranial nerve deficit. She exhibits normal muscle tone. Coordination normal.  Skin: Skin is warm. No rash noted. She is not diaphoretic. No erythema. No pallor.  Psychiatric: She has a normal mood and affect. Her behavior is normal. Judgment and thought content normal.  Vitals  reviewed.         Assessment & Plan:  Benign essential HTN - Plan: COMPLETE METABOLIC PANEL WITH GFR, Lipid panel  Flu vaccine need - Plan: Flu Vaccine QUAD 36+ mos IM  Screening cholesterol level  Routine general medical examination at a health care facility  Her physical exam today is completely normal. Mammogram is up-to-date. Colonoscopy is up-to-date. Pap smear is not necessary. Bone density is up-to-date. I will check a CMP and a fasting lipid panel. Patient received her flu shot. We discussed the shingles vaccine. She declined HIV and hepatitis C screening. Recommended trying Flexeril 10 mg by mouth daily at bedtime as a muscle relaxer to help her sleep and also to treat the muscle stiffness and muscle soreness in  her left trapezius and left shoulder. Reassess in 2-3 weeks

## 2016-11-05 ENCOUNTER — Other Ambulatory Visit: Payer: Self-pay

## 2016-11-05 MED ORDER — NAPROXEN 500 MG PO TABS: 500.0000 mg | ORAL_TABLET | Freq: Two times a day (BID) | ORAL | 0 refills | Status: DC | PRN

## 2016-12-17 ENCOUNTER — Other Ambulatory Visit: Payer: Self-pay | Admitting: Family Medicine

## 2016-12-17 DIAGNOSIS — M7989 Other specified soft tissue disorders: Secondary | ICD-10-CM

## 2017-01-08 ENCOUNTER — Ambulatory Visit (INDEPENDENT_AMBULATORY_CARE_PROVIDER_SITE_OTHER): Payer: Medicare Other | Admitting: Family Medicine

## 2017-01-08 ENCOUNTER — Encounter: Payer: Self-pay | Admitting: Family Medicine

## 2017-01-08 VITALS — BP 142/90 | HR 80 | Temp 98.3°F | Resp 16 | Ht 64.0 in | Wt 267.0 lb

## 2017-01-08 DIAGNOSIS — L089 Local infection of the skin and subcutaneous tissue, unspecified: Secondary | ICD-10-CM

## 2017-01-08 DIAGNOSIS — L723 Sebaceous cyst: Secondary | ICD-10-CM

## 2017-01-08 MED ORDER — SULFAMETHOXAZOLE-TRIMETHOPRIM 800-160 MG PO TABS: 1.0000 | ORAL_TABLET | Freq: Two times a day (BID) | ORAL | 0 refills | Status: DC

## 2017-01-08 NOTE — Progress Notes (Signed)
Subjective:    Patient ID: [name], female    DOB: August 09, 1945, 71 y.o.   MRN: [id number]  HPI Patient presents with a red hot swollen indurated cyst versus boil on the posterior aspect of her right shoulder.  It has been there for more than a week.  It is firm and indurated and tender to the touch.  I believe it is an infected sebaceous cyst.  It has been draining purulent material however it is sealed back over and is now worsening.  She also reports some mild dizziness and also has some mild right sinus pain  Past Medical History:  Diagnosis Date  . Arthritis    arthritis -knees  . Colon polyps    benign- last colonoscopy 09-2008  . H/O seasonal allergies    no problems now  . Hyperlipidemia   . Hypertension   . Murmur, heart    Past Surgical History:  Procedure Laterality Date  . ABDOMINAL HYSTERECTOMY    . APPENDECTOMY     with gallbladder surgery  . BUNIONECTOMY Bilateral   . CHOLECYSTECTOMY     open-gallstones  . EXCISION OF ADNEXAL MASS Left August 03, 2013   at [facility]/right adnexal nodule,excision;benigh lymph node  . OOPHORECTOMY Bilateral 2008   ovarian cyst   Current Outpatient Medications on File Prior to Visit  Medication Sig Dispense Refill  . albuterol (PROAIR HFA) 108 (90 Base) MCG/ACT inhaler Inhale 2 puffs into the lungs every 6 (six) hours as needed for wheezing or shortness of breath. 1 Inhaler 3  . cyclobenzaprine (FLEXERIL) 10 MG tablet Take 1 tablet (10 mg total) by mouth 3 (three) times daily as needed for muscle spasms. 30 tablet 0  . diltiazem (CARDIZEM) 120 MG tablet TAKE ONE TABLET BY MOUTH THREE TIMES DAILY 270 tablet 0  . furosemide (LASIX) 40 MG tablet TAKE 1 TABLET BY MOUTH ONCE DAILY 30 tablet 3  . hydrochlorothiazide (HYDRODIURIL) 25 MG tablet TAKE ONE TABLET BY MOUTH ONCE DAILY 90 tablet 0  . lisinopril (PRINIVIL,ZESTRIL) 20 MG tablet TAKE TWO TABLETS BY MOUTH ONCE DAILY IN THE MORNING 180 tablet 3  . LORazepam (ATIVAN) 0.5 MG tablet  Take 1 tablet (0.5 mg total) by mouth at bedtime as needed for sleep. 30 tablet 0  . Multiple Vitamins-Minerals (HAIR/SKIN/NAILS PO) Take 1 tablet by mouth daily.    . naproxen (NAPROSYN) 500 MG tablet Take 1 tablet (500 mg total) by mouth 2 (two) times daily as needed for mild pain. 180 tablet 0  . polyethylene glycol (MIRALAX / GLYCOLAX) packet Take 17 g by mouth as needed.    Marland Kitchen tetrahydrozoline 0.05 % ophthalmic solution Place 1 drop into both eyes as needed (eye allergies).     No current facility-administered medications on file prior to visit.    No Known Allergies Social History   Socioeconomic History  . Marital status: Married    Spouse name: Not on file  . Number of children: Not on file  . Years of education: Not on file  . Highest education level: Not on file  Social Needs  . Financial resource strain: Not on file  . Food insecurity - worry: Not on file  . Food insecurity - inability: Not on file  . Transportation needs - medical: Not on file  . Transportation needs - non-medical: Not on file  Occupational History  . Not on file  Tobacco Use  . Smoking status: Never Smoker  . Smokeless tobacco: Never Used  Substance  and Sexual Activity  . Alcohol use: No  . Drug use: No  . Sexual activity: Yes  Other Topics Concern  . Not on file  Social History Narrative  . Not on file     Review of Systems  All other systems reviewed and are negative.      Objective:   Physical Exam  Cardiovascular: Normal rate, regular rhythm and normal heart sounds.   No murmur heard. Pulmonary/Chest: Effort normal and breath sounds normal. No respiratory distress. She has no wheezes. She has no rales.  Abdominal: Soft. She exhibits no distension and no mass. There is no rebound and no guarding.  Vitals reviewed.   3 cm firm inflamed indurated red sebaceous cyst on the posterior aspect of the right shoulder near the scapula.      Assessment & Plan:  Infected sebaceous cyst -  Plan: sulfamethoxazole-trimethoprim (BACTRIM DS,SEPTRA DS) 800-160 MG tablet  Area was anesthetized with 0.1% lidocaine with epinephrine.  A cruciate incision was made in the center of the cyst.  Copious inflamed foul-smelling cyst sac contents were expressed.  I was able to remove part of the cyst sac wall as well.  The wound was then probed and cleaned with Q-tips soaked in hydrogen peroxide.  The wound was then packed with 6 inches of 1/4 inch iodoform gauze.  Wound care was discussed.  Recommended starting Bactrim double strength tablets twice daily for 7 days.  Check next week if no better or sooner if worse

## 2017-01-22 ENCOUNTER — Other Ambulatory Visit: Payer: Self-pay | Admitting: Family Medicine

## 2017-02-10 ENCOUNTER — Ambulatory Visit (INDEPENDENT_AMBULATORY_CARE_PROVIDER_SITE_OTHER): Payer: Medicare HMO | Admitting: Family Medicine

## 2017-02-10 ENCOUNTER — Encounter: Payer: Self-pay | Admitting: Family Medicine

## 2017-02-10 VITALS — BP 144/92 | HR 84 | Temp 98.1°F | Resp 18 | Ht 64.0 in | Wt 266.0 lb

## 2017-02-10 DIAGNOSIS — J209 Acute bronchitis, unspecified: Secondary | ICD-10-CM | POA: Diagnosis not present

## 2017-02-10 MED ORDER — PREDNISONE 20 MG PO TABS: ORAL_TABLET | ORAL | 0 refills | Status: DC

## 2017-02-10 MED ORDER — LEVOFLOXACIN 500 MG PO TABS: 500.0000 mg | ORAL_TABLET | Freq: Every day | ORAL | 0 refills | Status: DC

## 2017-02-10 NOTE — Progress Notes (Signed)
Subjective:    Patient ID: [name], female    DOB: 01-May-1945, 72 y.o.   MRN: [id number]  HPI  Patient reports a one-week history of coughing.  Cough is productive of thick green sputum.  Over the last 2 days the cough has worsened.  She is developed shortness of breath.  She has developed wheezing.  She has developed subjective fevers.  She also had one small episode of hemoptysis with blood-tinged sputum.  She took a photograph to show me today.  The photographs demonstrates thick green sputum on a napkin with streaks of blood.  This is been the only occurrence of this.  She denies any pleurisy.  She denies any chest pain.  Exam is significant for diminished breath sounds bilaterally and expiratory wheezing Past Medical History:  Diagnosis Date  . Arthritis    arthritis -knees  . Colon polyps    benign- last colonoscopy 09-2008  . H/O seasonal allergies    no problems now  . Hyperlipidemia   . Hypertension   . Murmur, heart    Past Surgical History:  Procedure Laterality Date  . ABDOMINAL HYSTERECTOMY    . APPENDECTOMY     with gallbladder surgery  . BUNIONECTOMY Bilateral   . CHOLECYSTECTOMY     open-gallstones  . EXCISION OF ADNEXAL MASS Left August 03, 2013   at [facility]/right adnexal nodule,excision;benigh lymph node  . OOPHORECTOMY Bilateral 2008   ovarian cyst   Current Outpatient Medications on File Prior to Visit  Medication Sig Dispense Refill  . cyclobenzaprine (FLEXERIL) 10 MG tablet Take 1 tablet (10 mg total) by mouth 3 (three) times daily as needed for muscle spasms. 30 tablet 0  . diltiazem (CARDIZEM) 120 MG tablet TAKE ONE TABLET BY MOUTH THREE TIMES DAILY 270 tablet 0  . furosemide (LASIX) 40 MG tablet TAKE 1 TABLET BY MOUTH ONCE DAILY 30 tablet 3  . hydrochlorothiazide (HYDRODIURIL) 25 MG tablet TAKE ONE TABLET BY MOUTH ONCE DAILY 90 tablet 0  . lisinopril (PRINIVIL,ZESTRIL) 20 MG tablet TAKE TWO TABLETS BY MOUTH ONCE DAILY IN THE MORNING 180 tablet 3    . Multiple Vitamins-Minerals (HAIR/SKIN/NAILS PO) Take 1 tablet by mouth daily.    . naproxen (NAPROSYN) 500 MG tablet Take 1 tablet (500 mg total) by mouth 2 (two) times daily as needed for mild pain. 180 tablet 0  . polyethylene glycol (MIRALAX / GLYCOLAX) packet Take 17 g by mouth as needed.    Marland Kitchen PROAIR HFA 108 (90 Base) MCG/ACT inhaler INHALE TWO PUFFS BY MOUTH EVERY 6 HOURS AS NEEDED FOR WHEEZING FOR SHORTNESS OF BREATH 9 each 3  . sulfamethoxazole-trimethoprim (BACTRIM DS,SEPTRA DS) 800-160 MG tablet Take 1 tablet by mouth 2 (two) times daily. 14 tablet 0  . tetrahydrozoline 0.05 % ophthalmic solution Place 1 drop into both eyes as needed (eye allergies).     No current facility-administered medications on file prior to visit.    No Known Allergies Social History   Socioeconomic History  . Marital status: Married    Spouse name: Not on file  . Number of children: Not on file  . Years of education: Not on file  . Highest education level: Not on file  Social Needs  . Financial resource strain: Not on file  . Food insecurity - worry: Not on file  . Food insecurity - inability: Not on file  . Transportation needs - medical: Not on file  . Transportation needs - non-medical: Not on file  Occupational History  . Not on file  Tobacco Use  . Smoking status: Never Smoker  . Smokeless tobacco: Never Used  Substance and Sexual Activity  . Alcohol use: No  . Drug use: No  . Sexual activity: Yes  Other Topics Concern  . Not on file  Social History Narrative  . Not on file     Review of Systems  All other systems reviewed and are negative.      Objective:   Physical Exam  Constitutional: She appears well-developed and well-nourished.  HENT:  Right Ear: External ear normal.  Left Ear: External ear normal.  Nose: Nose normal.  Mouth/Throat: Oropharynx is clear and moist. No oropharyngeal exudate.  Neck: Neck supple.  Cardiovascular: Normal rate, regular rhythm and normal  heart sounds.  Pulmonary/Chest: Effort normal. No respiratory distress. She has wheezes. She has no rales.  Lymphadenopathy:    She has no cervical adenopathy.          Assessment & Plan:  Bronchitis, acute, with bronchospasm - Plan: predniSONE (DELTASONE) 20 MG tablet, levofloxacin (LEVAQUIN) 500 MG tablet  Patient has bronchitis versus walking pneumonia with bronchospasms.  Begin Levaquin 500 mg p.o. daily for 7 days coupled with a prednisone taper pack to help relieve the bronchospasm.  She can use albuterol 2 puffs inhaled every 6 hours as needed for wheezing.  Recheck in 1 week if no better, sooner if worse.  If she develops frank hemoptysis she is to go to the emergency room immediately

## 2017-02-23 DIAGNOSIS — H3509 Other intraretinal microvascular abnormalities: Secondary | ICD-10-CM | POA: Diagnosis not present

## 2017-02-23 DIAGNOSIS — H40013 Open angle with borderline findings, low risk, bilateral: Secondary | ICD-10-CM | POA: Diagnosis not present

## 2017-02-23 DIAGNOSIS — H35033 Hypertensive retinopathy, bilateral: Secondary | ICD-10-CM | POA: Diagnosis not present

## 2017-02-23 DIAGNOSIS — H25013 Cortical age-related cataract, bilateral: Secondary | ICD-10-CM | POA: Diagnosis not present

## 2017-02-23 DIAGNOSIS — D2210 Melanocytic nevi of unspecified eyelid, including canthus: Secondary | ICD-10-CM | POA: Diagnosis not present

## 2017-02-23 DIAGNOSIS — H2513 Age-related nuclear cataract, bilateral: Secondary | ICD-10-CM | POA: Diagnosis not present

## 2017-02-25 ENCOUNTER — Other Ambulatory Visit: Payer: Self-pay | Admitting: Family Medicine

## 2017-02-25 DIAGNOSIS — Z1231 Encounter for screening mammogram for malignant neoplasm of breast: Secondary | ICD-10-CM

## 2017-03-30 ENCOUNTER — Ambulatory Visit
Admission: RE | Admit: 2017-03-30 | Discharge: 2017-03-30 | Disposition: A | Discharge status: Home / Self Care | Payer: Medicare Other | Source: Ambulatory Visit | Attending: Family Medicine | Admitting: Family Medicine

## 2017-03-30 DIAGNOSIS — Z1231 Encounter for screening mammogram for malignant neoplasm of breast: Secondary | ICD-10-CM

## 2017-04-24 ENCOUNTER — Other Ambulatory Visit: Payer: Self-pay | Admitting: Family Medicine

## 2017-04-24 DIAGNOSIS — M7989 Other specified soft tissue disorders: Secondary | ICD-10-CM

## 2017-05-11 ENCOUNTER — Other Ambulatory Visit: Payer: Self-pay | Admitting: Family Medicine

## 2017-05-28 ENCOUNTER — Ambulatory Visit (INDEPENDENT_AMBULATORY_CARE_PROVIDER_SITE_OTHER): Payer: Medicare HMO | Admitting: Family Medicine

## 2017-05-28 ENCOUNTER — Encounter: Payer: Self-pay | Admitting: Family Medicine

## 2017-05-28 VITALS — BP 138/70 | HR 78 | Temp 98.1°F | Resp 16 | Ht 64.0 in | Wt 267.0 lb

## 2017-05-28 DIAGNOSIS — R609 Edema, unspecified: Secondary | ICD-10-CM | POA: Diagnosis not present

## 2017-05-28 DIAGNOSIS — M7989 Other specified soft tissue disorders: Secondary | ICD-10-CM | POA: Diagnosis not present

## 2017-05-28 DIAGNOSIS — R1032 Left lower quadrant pain: Secondary | ICD-10-CM

## 2017-05-28 LAB — CBC WITH DIFFERENTIAL/PLATELET
BASOS PCT: 0.6 %
Basophils Absolute: 54 cells/uL (ref 0–200)
EOS PCT: 3.2 %
Eosinophils Absolute: 288 cells/uL (ref 15–500)
HCT: 40.2 % (ref 35.0–45.0)
Hemoglobin: 14 g/dL (ref 11.7–15.5)
Lymphs Abs: 1926 cells/uL (ref 850–3900)
MCH: 29.3 pg (ref 27.0–33.0)
MCHC: 34.8 g/dL (ref 32.0–36.0)
MCV: 84.1 fL (ref 80.0–100.0)
MONOS PCT: 7.8 %
MPV: 12.5 fL (ref 7.5–12.5)
Neutro Abs: 6030 cells/uL (ref 1500–7800)
Neutrophils Relative %: 67 %
PLATELETS: 232 10*3/uL (ref 140–400)
RBC: 4.78 10*6/uL (ref 3.80–5.10)
RDW: 12.3 % (ref 11.0–15.0)
Total Lymphocyte: 21.4 %
WBC: 9 10*3/uL (ref 3.8–10.8)
WBCMIX: 702 {cells}/uL (ref 200–950)

## 2017-05-28 MED ORDER — CIPROFLOXACIN HCL 500 MG PO TABS: 500.0000 mg | ORAL_TABLET | Freq: Two times a day (BID) | ORAL | 0 refills | Status: DC

## 2017-05-28 MED ORDER — METRONIDAZOLE 500 MG PO TABS
500.0000 mg | ORAL_TABLET | Freq: Two times a day (BID) | ORAL | 0 refills | Status: DC
Start: 2017-05-28 — End: 2018-03-29

## 2017-05-28 NOTE — Progress Notes (Signed)
Subjective:    Patient ID: [name], female    DOB: [birth year], 72 y.o.   MRN: [id number]  HPI 06/05/14  patient presents with 2 days of left lower quadrant abdominal pain that radiates from her left lower abdomen into her back. She is tender to palpation in the left lower quadrant. She has a history of hysterectomy. She denies any melena or hematochezia. The pain is worse with food. She denies any nausea or vomiting. She denies any dysuria or hematuria. She has no CVA tenderness. At that time, my plan was:  I believe the patient has diverticulitis. Begin Cipro 500 mg by mouth twice a day and Flagyl 500 mg by mouth twice a day for 14 days. Recheck in 48 hours if no better proceed with a CT scan. Seek medical attention immediately if worse.  08/09/15 Pain gradually improved last time but has returned over the last month.  She reports pain in the left lower quadrant and also on the left side of her abdomen. It comes and goes without reason. It can hurt with twisting or turning back and also her when she is sitting still. She denies any blood in her stool. She denies any fever. She denies any diarrhea. The pain is mild to moderate, a 3 to a 5 on a scale of 10. However it is not getting better. She denies any dysuria or hematuria or frequency. She denies any blood in her stool. She denies any black tarry stools. She denies any fever. She denies any constipation or diarrhea. She does have a history of severe degenerative disc disease in lumbar spine and some is possible he could be referred pain as the pain radiates from her left lower quadrant into her left mid back. She also has a history of a hysterectomy and surgery to remove ovarian cyst on that side so as possible this could be from adhesions and scar tissue.  At that time, my plan was: I believe the patient's abdominal pain is due to adhesions. Diverticulitis is also a possibility. I will obtain a CBC as well as a CMP. I will also obtain a CT scan  of the abdomen and pelvis given her history of pelvic mass, ovarian cyst, and small bowel obstruction. However I believe the adhesions are what is likely causing her abdominal pain.  At that time, the CT scan showed:  Fatty infiltration liver. Biliary dilatation with mild progression from the prior study. Correlate with liver function tests. This could possibly be secondary to is cholecystectomy.  Sigmoid diverticular change with mucosal edema which was not present on the prior study. Possible diverticulitis which may be subacute. No abscess.  05/28/17 Patient states that over the last 2 weeks, she has been having episodic left lower quadrant abdominal pain again.  It is a 5 on a scale of 1-10.  It is located in the left lower quadrant.  She is tender to palpation in that area.  She denies any diarrhea.  She denies any melena.  She denies any hematochezia.  She has not been constipated.  She denies any nausea or vomiting.  She denies any fevers or chills.  There are no exacerbating or alleviating factors.  However when she has the pain, the patient is able to splint and hold that area and it makes the pain better if she holds completely still.  She also reports increased swelling in both legs distal to the knee.  Today she has +1 pitting edema distal to the  knee bilaterally.  She is taking furosemide 40 mg a day but not hydrochlorothiazide as was previously directed not to take it.  She denies any chest pain.  She denies any shortness of breath.  She denies any orthopnea.     Past Medical History:  Diagnosis Date  . Arthritis    arthritis -knees  . Colon polyps    benign- last colonoscopy 09-2008  . H/O seasonal allergies    no problems now  . Hyperlipidemia   . Hypertension   . Murmur, heart    Past Surgical History:  Procedure Laterality Date  . ABDOMINAL HYSTERECTOMY    . APPENDECTOMY     with gallbladder surgery  . BUNIONECTOMY Bilateral   . CHOLECYSTECTOMY     open-gallstones   . EXCISION OF ADNEXAL MASS Left August 03, 2013   at [facility]/right adnexal nodule,excision;benigh lymph node  . OOPHORECTOMY Bilateral 2008   ovarian cyst   Current Outpatient Medications on File Prior to Visit  Medication Sig Dispense Refill  . cyclobenzaprine (FLEXERIL) 10 MG tablet Take 1 tablet (10 mg total) by mouth 3 (three) times daily as needed for muscle spasms. 30 tablet 0  . diltiazem (CARDIZEM) 120 MG tablet TAKE ONE TABLET BY MOUTH THREE TIMES DAILY 270 tablet 0  . diltiazem (CARDIZEM) 120 MG tablet TAKE 1 TABLET BY MOUTH THREE TIMES DAILY 270 tablet 3  . furosemide (LASIX) 40 MG tablet TAKE 1 TABLET BY MOUTH ONCE DAILY 30 tablet 3  . hydrochlorothiazide (HYDRODIURIL) 25 MG tablet TAKE ONE TABLET BY MOUTH ONCE DAILY 90 tablet 0  . levofloxacin (LEVAQUIN) 500 MG tablet Take 1 tablet (500 mg total) by mouth daily. 7 tablet 0  . lisinopril (PRINIVIL,ZESTRIL) 20 MG tablet TAKE TWO TABLETS BY MOUTH ONCE DAILY IN THE MORNING 180 tablet 3  . Multiple Vitamins-Minerals (HAIR/SKIN/NAILS PO) Take 1 tablet by mouth daily.    . naproxen (NAPROSYN) 500 MG tablet Take 1 tablet (500 mg total) by mouth 2 (two) times daily as needed for mild pain. 180 tablet 0  . polyethylene glycol (MIRALAX / GLYCOLAX) packet Take 17 g by mouth as needed.    . predniSONE (DELTASONE) 20 MG tablet 3 tabs poqday 1-2, 2 tabs poqday 3-4, 1 tab poqday 5-6 12 tablet 0  . PROAIR HFA 108 (90 Base) MCG/ACT inhaler INHALE TWO PUFFS BY MOUTH EVERY 6 HOURS AS NEEDED FOR WHEEZING FOR SHORTNESS OF BREATH 9 each 3  . sulfamethoxazole-trimethoprim (BACTRIM DS,SEPTRA DS) 800-160 MG tablet Take 1 tablet by mouth 2 (two) times daily. 14 tablet 0  . tetrahydrozoline 0.05 % ophthalmic solution Place 1 drop into both eyes as needed (eye allergies).     No current facility-administered medications on file prior to visit.    No Known Allergies Social History   Socioeconomic History  . Marital status: Married    Spouse name: Not  on file  . Number of children: Not on file  . Years of education: Not on file  . Highest education level: Not on file  Occupational History  . Not on file  Social Needs  . Financial resource strain: Not on file  . Food insecurity:    Worry: Not on file    Inability: Not on file  . Transportation needs:    Medical: Not on file    Non-medical: Not on file  Tobacco Use  . Smoking status: Never Smoker  . Smokeless tobacco: Never Used  Substance and Sexual Activity  . Alcohol use: No  .  Drug use: No  . Sexual activity: Yes  Lifestyle  . Physical activity:    Days per week: Not on file    Minutes per session: Not on file  . Stress: Not on file  Relationships  . Social connections:    Talks on phone: Not on file    Gets together: Not on file    Attends religious service: Not on file    Active member of club or organization: Not on file    Attends meetings of clubs or organizations: Not on file    Relationship status: Not on file  . Intimate partner violence:    Fear of current or ex partner: Not on file    Emotionally abused: Not on file    Physically abused: Not on file    Forced sexual activity: Not on file  Other Topics Concern  . Not on file  Social History Narrative  . Not on file     Review of Systems  All other systems reviewed and are negative.      Objective:   Physical Exam  Constitutional: She appears well-developed and well-nourished. No distress.  Neck: No JVD present.  Cardiovascular: Normal rate. An irregularly irregular rhythm present.  Murmur heard.  Systolic murmur is present with a grade of 3/6. Pulmonary/Chest: Effort normal and breath sounds normal. No respiratory distress. She has no wheezes. She has no rales.  Abdominal: Soft. She exhibits no distension and no mass. There is tenderness in the left lower quadrant. There is no rigidity, no rebound, no guarding, no tenderness at McBurney's point and negative Murphy's sign.    Musculoskeletal:  She exhibits edema.  Skin: She is not diaphoretic.  Vitals reviewed.         Assessment & Plan:  LLQ abdominal pain - Plan: ciprofloxacin (CIPRO) 500 MG tablet, metroNIDAZOLE (FLAGYL) 500 MG tablet, CBC with Differential/Platelet, COMPLETE METABOLIC PANEL WITH GFR  Leg swelling - Plan: Brain natriuretic peptide  Left lower quadrant abdominal pain is likely either due to diverticulitis versus adhesions.  Begin Cipro 500 mg p.o. twice daily and Flagyl 500 mg p.o. twice daily for 10 days.  Add MiraLAX to ensure regular loose bowel movements.  Recheck in 1 week or sooner if worsening.  Obtain CBC and CMP.  Leg swelling is most likely due to chronic venous insufficiency.  She is taking Lasix 40 mg once daily.  Check BNP.  Consider increasing Lasix to 40 mg twice daily with potassium supplementation if lab work is normal.

## 2017-05-29 LAB — COMPLETE METABOLIC PANEL WITH GFR
AG Ratio: 1.9 (calc) (ref 1.0–2.5)
ALBUMIN MSPROF: 4.3 g/dL (ref 3.6–5.1)
ALKALINE PHOSPHATASE (APISO): 60 U/L (ref 33–130)
ALT: 13 U/L (ref 6–29)
AST: 18 U/L (ref 10–35)
BUN: 12 mg/dL (ref 7–25)
CALCIUM: 9.1 mg/dL (ref 8.6–10.4)
CO2: 29 mmol/L (ref 20–32)
CREATININE: 0.99 mg/dL (ref 0.50–0.99)
Chloride: 101 mmol/L (ref 98–110)
GFR, EST NON AFRICAN AMERICAN: 58 mL/min/{1.73_m2} — AB (ref >=60)
GFR, Est African American: 67 mL/min/{1.73_m2} (ref >=60)
GLOBULIN: 2.3 g/dL (ref 1.9–3.7)
GLUCOSE: 129 mg/dL — AB (ref 65–99)
Potassium: 3.8 mmol/L (ref 3.5–5.3)
Sodium: 140 mmol/L (ref 135–146)
Total Bilirubin: 0.3 mg/dL (ref 0.2–1.2)
Total Protein: 6.6 g/dL (ref 6.1–8.1)

## 2017-05-29 LAB — BRAIN NATRIURETIC PEPTIDE: BRAIN NATRIURETIC PEPTIDE: 45 pg/mL (ref <100)

## 2017-07-02 DIAGNOSIS — H5711 Ocular pain, right eye: Secondary | ICD-10-CM | POA: Diagnosis not present

## 2017-07-02 DIAGNOSIS — H40023 Open angle with borderline findings, high risk, bilateral: Secondary | ICD-10-CM | POA: Diagnosis not present

## 2017-07-02 DIAGNOSIS — H1013 Acute atopic conjunctivitis, bilateral: Secondary | ICD-10-CM | POA: Diagnosis not present

## 2017-07-02 DIAGNOSIS — H04123 Dry eye syndrome of bilateral lacrimal glands: Secondary | ICD-10-CM | POA: Diagnosis not present

## 2017-08-25 ENCOUNTER — Other Ambulatory Visit: Payer: Self-pay | Admitting: Family Medicine

## 2017-08-25 DIAGNOSIS — M7989 Other specified soft tissue disorders: Secondary | ICD-10-CM

## 2017-09-03 DIAGNOSIS — L400 Psoriasis vulgaris: Secondary | ICD-10-CM | POA: Diagnosis not present

## 2017-09-03 DIAGNOSIS — D2372 Other benign neoplasm of skin of left lower limb, including hip: Secondary | ICD-10-CM | POA: Diagnosis not present

## 2017-09-14 ENCOUNTER — Other Ambulatory Visit: Payer: Self-pay | Admitting: Family Medicine

## 2017-09-14 DIAGNOSIS — M25562 Pain in left knee: Secondary | ICD-10-CM | POA: Diagnosis not present

## 2017-09-14 DIAGNOSIS — M25561 Pain in right knee: Secondary | ICD-10-CM | POA: Diagnosis not present

## 2017-10-27 DIAGNOSIS — Z01419 Encounter for gynecological examination (general) (routine) without abnormal findings: Secondary | ICD-10-CM | POA: Diagnosis not present

## 2017-12-03 ENCOUNTER — Ambulatory Visit (INDEPENDENT_AMBULATORY_CARE_PROVIDER_SITE_OTHER): Payer: Medicare HMO

## 2017-12-03 DIAGNOSIS — Z23 Encounter for immunization: Secondary | ICD-10-CM

## 2017-12-03 NOTE — Progress Notes (Signed)
Patient was in office for high dose flu vaccine.Patient received the vaccine in her right deltoid patient tolerated well

## 2018-01-14 ENCOUNTER — Encounter: Payer: Self-pay | Admitting: Family Medicine

## 2018-01-14 ENCOUNTER — Ambulatory Visit (INDEPENDENT_AMBULATORY_CARE_PROVIDER_SITE_OTHER): Payer: Medicare HMO | Admitting: Family Medicine

## 2018-01-14 VITALS — BP 118/70 | HR 72 | Temp 97.9°F | Resp 16 | Ht 64.0 in | Wt 259.0 lb

## 2018-01-14 DIAGNOSIS — J0140 Acute pansinusitis, unspecified: Secondary | ICD-10-CM | POA: Diagnosis not present

## 2018-01-14 DIAGNOSIS — J209 Acute bronchitis, unspecified: Secondary | ICD-10-CM

## 2018-01-14 MED ORDER — BENZONATATE 100 MG PO CAPS: 100.0000 mg | ORAL_CAPSULE | Freq: Three times a day (TID) | ORAL | 0 refills | Status: DC | PRN

## 2018-01-14 MED ORDER — DOXYCYCLINE HYCLATE 100 MG PO TABS: 100.0000 mg | ORAL_TABLET | Freq: Two times a day (BID) | ORAL | 0 refills | Status: AC

## 2018-01-14 MED ORDER — ALBUTEROL SULFATE HFA 108 (90 BASE) MCG/ACT IN AERS: 2.0000 | INHALATION_SPRAY | RESPIRATORY_TRACT | 2 refills | Status: DC | PRN

## 2018-01-14 MED ORDER — PREDNISONE 20 MG PO TABS: 40.0000 mg | ORAL_TABLET | Freq: Every day | ORAL | 0 refills | Status: AC

## 2018-01-14 NOTE — Progress Notes (Signed)
Patient ID: [name], female    DOB: 12/10/1945, 72 y.o.   MRN: [id number]  PCP: [name], MD  Chief Complaint  Patient presents with  . Cough    Patient in today with c/o cough, and watering eyes.    Subjective:   [name] is a 72 y.o. female, presents to clinic with CC of cough, cold-like sx and watery eyes. Last "Monday Tuesday and Wednesday she was weak in her eyes", 6 days ago started coughing, having chills. Coughing - severe, forceful, some productive sputum sputum yellow to green in am and would clear up throughout the day, causing her to wet her pants Eyes itching, no drainage crusting pain or blurred vision, itching is better today than it was when it started. No CP with breathing, she does have have some pain in her central upper chest - described as harsh, stinging pain, tx with vicks vaper rub and heating pad to chest + SOB with exertion + wheeze  Started taking allegra for for profuse running nose Also has been taking corticetinefor cough yesterday and today a little better     Patient Active Problem List   Diagnosis Date Noted  . Arthropathia 09/23/2013  . Benign neoplasm of colon 09/23/2013  . Essential (primary) hypertension 09/23/2013  . SBO (small bowel obstruction) ([place]) 08/17/2013  . Encounter for postoperative care 08/03/2013  . Pelvic mass 07/05/2013  . Pain in joint, pelvic region and thigh 07/05/2013  . Hyperlipidemia   . Hypertension   . Arthritis   . Colon polyps      Prior to Admission medications   Medication Sig Start Date End Date Taking? Authorizing Provider  ciprofloxacin (CIPRO) 500 MG tablet Take 1 tablet (500 mg total) by mouth 2 (two) times daily. 05/28/17  Yes [name], MD  cyclobenzaprine (FLEXERIL) 10 MG tablet Take 1 tablet (10 mg total) by mouth 3 (three) times daily as needed for muscle spasms. 11/03/16  Yes [name], MD  diltiazem (CARDIZEM) 120 MG tablet TAKE ONE TABLET BY MOUTH THREE TIMES  DAILY 04/04/15  Yes [name], MD  furosemide (LASIX) 40 MG tablet TAKE 1 TABLET BY MOUTH ONCE DAILY 08/26/17  Yes [name], MD  lisinopril (PRINIVIL,ZESTRIL) 20 MG tablet TAKE 2 TABLETS BY MOUTH ONCE DAILY IN THE MORNING 09/15/17  Yes [name], MD  metroNIDAZOLE (FLAGYL) 500 MG tablet Take 1 tablet (500 mg total) by mouth 2 (two) times daily. 05/28/17  Yes [name], MD  Multiple Vitamins-Minerals (HAIR/SKIN/NAILS PO) Take 1 tablet by mouth daily.   Yes [provider]  naproxen (NAPROSYN) 500 MG tablet Take 1 tablet (500 mg total) by mouth 2 (two) times daily as needed for mild pain. 11/05/16  Yes [name], MD  PROAIR HFA 108 [phone] Base) MCG/ACT inhaler INHALE TWO PUFFS BY MOUTH EVERY 6 HOURS AS NEEDED FOR WHEEZING FOR SHORTNESS OF BREATH 01/22/17  Yes [name], MD  tetrahydrozoline 0.05 % ophthalmic solution Place 1 drop into both eyes as needed (eye allergies).   Yes [provider]     No Known Allergies   Family History  Problem Relation Age of Onset  . Colon cancer Mother 98  . Heart attack Father   . Breast cancer Paternal Aunt   . Esophageal cancer Neg Hx   . Pancreatic cancer Neg Hx   . Rectal cancer Neg Hx   . Stomach cancer Neg Hx  Social History   Socioeconomic History  . Marital status: Married    Spouse name: Not on file  . Number of children: Not on file  . Years of education: Not on file  . Highest education level: Not on file  Occupational History  . Not on file  Social Needs  . Financial resource strain: Not on file  . Food insecurity:    Worry: Not on file    Inability: Not on file  . Transportation needs:    Medical: Not on file    Non-medical: Not on file  Tobacco Use  . Smoking status: Never Smoker  . Smokeless tobacco: Never Used  Substance and Sexual Activity  . Alcohol use: No  . Drug use: No  . Sexual activity: Yes  Lifestyle  . Physical activity:    Days per week: Not on  file    Minutes per session: Not on file  . Stress: Not on file  Relationships  . Social connections:    Talks on phone: Not on file    Gets together: Not on file    Attends religious service: Not on file    Active member of club or organization: Not on file    Attends meetings of clubs or organizations: Not on file    Relationship status: Not on file  . Intimate partner violence:    Fear of current or ex partner: Not on file    Emotionally abused: Not on file    Physically abused: Not on file    Forced sexual activity: Not on file  Other Topics Concern  . Not on file  Social History Narrative  . Not on file     Review of Systems  Constitutional: Negative.   HENT: Negative.   Eyes: Negative.   Respiratory: Negative.   Cardiovascular: Negative.   Gastrointestinal: Negative.   Endocrine: Negative.   Genitourinary: Negative.   Musculoskeletal: Negative.   Skin: Negative.   Allergic/Immunologic: Negative.   Neurological: Negative.   Hematological: Negative.   Psychiatric/Behavioral: Negative.   All other systems reviewed and are negative.      Objective:    Vitals:   01/14/18 1517  BP: 118/70  Pulse: 72  Resp: 16  Temp: 97.9 F (36.6 C)  TempSrc: Oral  SpO2: 94%  Weight: 259 lb (117.5 kg)  Height: 5\' 4"  (1.626 m)      Physical Exam Vitals signs and nursing note reviewed.  Constitutional:      General: She is not in acute distress.    Appearance: She is well-developed. She is not ill-appearing, toxic-appearing or diaphoretic.  HENT:     Head: Normocephalic and atraumatic.     Jaw: There is normal jaw occlusion.     Right Ear: Hearing, tympanic membrane, ear canal and external ear normal.     Left Ear: Hearing, tympanic membrane, ear canal and external ear normal.     Nose: Mucosal edema, congestion and rhinorrhea present.     Right Sinus: Maxillary sinus tenderness and frontal sinus tenderness present.     Left Sinus: Maxillary sinus tenderness and  frontal sinus tenderness present.     Mouth/Throat:     Mouth: Mucous membranes are not pale.     Pharynx: Uvula midline. No oropharyngeal exudate or uvula swelling.     Tonsils: No tonsillar abscesses.  Eyes:     General: Lids are normal.        Right eye: No foreign body or discharge.  Left eye: No foreign body or discharge.     Extraocular Movements: Extraocular movements intact.     Conjunctiva/sclera: Conjunctivae normal.     Right eye: Right conjunctiva is not injected. No exudate.    Left eye: Left conjunctiva is not injected. No exudate.    Pupils: Pupils are equal, round, and reactive to light.  Neck:     Musculoskeletal: Normal range of motion and neck supple.     Trachea: No tracheal deviation.  Cardiovascular:     Rate and Rhythm: Normal rate and regular rhythm.     Pulses: Normal pulses.     Heart sounds: Normal heart sounds. No murmur. No friction rub. No gallop.   Pulmonary:     Effort: Pulmonary effort is normal. No respiratory distress.     Breath sounds: No stridor. Wheezing present. No rhonchi or rales.  Abdominal:     General: Bowel sounds are normal. There is no distension.     Palpations: Abdomen is soft.  Musculoskeletal: Normal range of motion.  Skin:    General: Skin is warm and dry.     Coloration: Skin is not pale.     Findings: No rash.  Neurological:     Mental Status: She is alert.     Motor: No abnormal muscle tone.     Coordination: Coordination normal.  Psychiatric:        Behavior: Behavior normal.           Assessment & Plan:    72 year old patient presents with eye itching, URI symptoms, productive cough, worse in the morning and improves throughout the day.  On exam her eyes appear normal, nasal mucosa is extremely edematous erythematous and congested, posterior oropharynx mildly injected, no lymphadenopathy, she has faint, diffuse expiratory wheeze.  She was instructed to continue to treat her allergies with over-the-counter  antihistamines, will treat acute bronchitis and sinusitis cover both with doxycycline, steroid and albuterol for wheezing, Tessalon and Mucinex and other over-the-counter cough medicines for cough.    ICD-10-CM   1. Acute bronchitis, unspecified organism J20.9 albuterol (VENTOLIN HFA) 108 (90 Base) MCG/ACT inhaler    predniSONE (DELTASONE) 20 MG tablet    benzonatate (TESSALON) 100 MG capsule  2. Acute non-recurrent pansinusitis J01.40 doxycycline (VIBRA-TABS) 100 MG tablet    predniSONE (DELTASONE) 20 MG tablet       Delsa Grana, PA-C 01/14/18 3:24 PM

## 2018-02-23 ENCOUNTER — Other Ambulatory Visit: Payer: Self-pay | Admitting: Obstetrics and Gynecology

## 2018-02-23 DIAGNOSIS — Z1231 Encounter for screening mammogram for malignant neoplasm of breast: Secondary | ICD-10-CM

## 2018-03-09 ENCOUNTER — Other Ambulatory Visit: Payer: Self-pay | Admitting: Family Medicine

## 2018-03-24 DIAGNOSIS — H40023 Open angle with borderline findings, high risk, bilateral: Secondary | ICD-10-CM | POA: Diagnosis not present

## 2018-03-24 DIAGNOSIS — H2513 Age-related nuclear cataract, bilateral: Secondary | ICD-10-CM | POA: Diagnosis not present

## 2018-03-24 DIAGNOSIS — H35033 Hypertensive retinopathy, bilateral: Secondary | ICD-10-CM | POA: Diagnosis not present

## 2018-03-24 DIAGNOSIS — H3509 Other intraretinal microvascular abnormalities: Secondary | ICD-10-CM | POA: Diagnosis not present

## 2018-03-29 ENCOUNTER — Encounter: Payer: Self-pay | Admitting: Family Medicine

## 2018-03-29 ENCOUNTER — Ambulatory Visit (INDEPENDENT_AMBULATORY_CARE_PROVIDER_SITE_OTHER): Payer: Medicare HMO | Admitting: Family Medicine

## 2018-03-29 ENCOUNTER — Ambulatory Visit
Admission: RE | Admit: 2018-03-29 | Discharge: 2018-03-29 | Disposition: A | Discharge status: Home / Self Care | Payer: Medicare HMO | Source: Ambulatory Visit | Attending: Family Medicine | Admitting: Family Medicine

## 2018-03-29 VITALS — BP 110/80 | HR 76 | Temp 97.9°F | Resp 16 | Ht 64.0 in | Wt 255.0 lb

## 2018-03-29 DIAGNOSIS — M5442 Lumbago with sciatica, left side: Secondary | ICD-10-CM | POA: Diagnosis not present

## 2018-03-29 DIAGNOSIS — M5441 Lumbago with sciatica, right side: Secondary | ICD-10-CM | POA: Diagnosis not present

## 2018-03-29 DIAGNOSIS — M5116 Intervertebral disc disorders with radiculopathy, lumbar region: Secondary | ICD-10-CM | POA: Diagnosis not present

## 2018-03-29 MED ORDER — PREDNISONE 20 MG PO TABS: ORAL_TABLET | ORAL | 0 refills | Status: DC

## 2018-03-29 NOTE — Progress Notes (Signed)
Subjective:    Patient ID: [name], female    DOB: 05-09-1945, 73 y.o.   MRN: [id number]  HPI Patient has a history of degenerative disc disease in her lumbar spine.  She had an MRI of the lumbar spine in 2008 which revealed a bulging disc at L3 and L4 causing effacement of the thecal sac.  Symptoms have been well controlled and absent up until last Monday.  She felt sudden pain in her lower back around the level of L4 while standing to get out of the bed.  She felt pain radiate down both of her legs.  It was a sharp intense burning pain.  Pain gradually improved.  However she has had low back pain the remainder of the week.  Saturday she was standing when she felt the pain again radiate from her lower back into both gluteus muscles and down the posterior aspect of both legs.  She states at that time that both legs started to feel weak.  She had to sit down.  There was some numbness in both legs.  Today she states that her legs feel fine.  She has no weakness in her legs.  She has no numbness in her legs.  She does have some residual low back pain.  She denies any saddle anesthesia or bowel or bladder incontinence. Past Medical History:  Diagnosis Date  . Arthritis    arthritis -knees  . Colon polyps    benign- last colonoscopy 09-2008  . H/O seasonal allergies    no problems now  . Hyperlipidemia   . Hypertension   . Murmur, heart    Past Surgical History:  Procedure Laterality Date  . ABDOMINAL HYSTERECTOMY    . APPENDECTOMY     with gallbladder surgery  . BUNIONECTOMY Bilateral   . CHOLECYSTECTOMY     open-gallstones  . EXCISION OF ADNEXAL MASS Left August 03, 2013   at [facility]/right adnexal nodule,excision;benigh lymph node  . OOPHORECTOMY Bilateral 2008   ovarian cyst   Current Outpatient Medications on File Prior to Visit  Medication Sig Dispense Refill  . albuterol (VENTOLIN HFA) 108 (90 Base) MCG/ACT inhaler Inhale 2 puffs into the lungs every 4 (four) hours as needed  for wheezing or shortness of breath. 1 Inhaler 2  . diltiazem (CARDIZEM) 120 MG tablet TAKE ONE TABLET BY MOUTH THREE TIMES DAILY 270 tablet 0  . furosemide (LASIX) 40 MG tablet TAKE 1 TABLET BY MOUTH ONCE DAILY 90 tablet 3  . lisinopril (PRINIVIL,ZESTRIL) 20 MG tablet TAKE 2 TABLETS BY MOUTH ONCE DAILY IN THE MORNING 180 tablet 3  . loratadine (CLARITIN) 10 MG tablet Take 10 mg by mouth daily.    . naproxen (NAPROSYN) 500 MG tablet TAKE 1 TABLET BY MOUTH TWICE DAILY AS NEEDED FOR  MILD  PAIN 180 tablet 0  . tetrahydrozoline 0.05 % ophthalmic solution Place 1 drop into both eyes as needed (eye allergies).     No current facility-administered medications on file prior to visit.    No Known Allergies Social History   Socioeconomic History  . Marital status: Married    Spouse name: Not on file  . Number of children: Not on file  . Years of education: Not on file  . Highest education level: Not on file  Occupational History  . Not on file  Social Needs  . Financial resource strain: Not on file  . Food insecurity:    Worry: Not on file    Inability:  Not on file  . Transportation needs:    Medical: Not on file    Non-medical: Not on file  Tobacco Use  . Smoking status: Never Smoker  . Smokeless tobacco: Never Used  Substance and Sexual Activity  . Alcohol use: No  . Drug use: No  . Sexual activity: Yes  Lifestyle  . Physical activity:    Days per week: Not on file    Minutes per session: Not on file  . Stress: Not on file  Relationships  . Social connections:    Talks on phone: Not on file    Gets together: Not on file    Attends religious service: Not on file    Active member of club or organization: Not on file    Attends meetings of clubs or organizations: Not on file    Relationship status: Not on file  . Intimate partner violence:    Fear of current or ex partner: Not on file    Emotionally abused: Not on file    Physically abused: Not on file    Forced sexual  activity: Not on file  Other Topics Concern  . Not on file  Social History Narrative  . Not on file      Review of Systems  Musculoskeletal: Positive for back pain.  All other systems reviewed and are negative.      Objective:   Physical Exam  Constitutional: She appears well-developed and well-nourished.  Cardiovascular: Normal rate and regular rhythm.  Murmur heard. Pulmonary/Chest: Effort normal and breath sounds normal. No respiratory distress. She has no wheezes. She has no rales.  Musculoskeletal:     Lumbar back: She exhibits decreased range of motion, tenderness, bony tenderness and pain.       Back:  Vitals reviewed.         Assessment & Plan:  Low back pain due to bilateral sciatica - Plan: DG Lumbar Spine Complete  Recommended the patient immediately get an x-ray of the lumbar spine.  I am concerned about spinal stenosis versus a herniated disc.  Today there is no evidence of cauda equina syndrome on exam.  The patient has normal muscle strength in both legs.  She has normal sensation in both legs and she has no evidence of bowel or bladder dysfunction.  I will start the patient on a prednisone taper pack to alleviate what I suspect is a bulging disc and calm the inflammation.  If pain continues to improve and x-ray is unremarkable, no further work-up is necessary at this time.  If pain persist, I would need an MRI of the lumbar spine to evaluate further

## 2018-04-02 ENCOUNTER — Ambulatory Visit
Admission: RE | Admit: 2018-04-02 | Discharge: 2018-04-02 | Disposition: A | Discharge status: Home / Self Care | Payer: Medicare HMO | Source: Ambulatory Visit | Attending: Obstetrics and Gynecology | Admitting: Obstetrics and Gynecology

## 2018-04-02 DIAGNOSIS — Z1231 Encounter for screening mammogram for malignant neoplasm of breast: Secondary | ICD-10-CM | POA: Diagnosis not present

## 2018-05-04 ENCOUNTER — Ambulatory Visit (INDEPENDENT_AMBULATORY_CARE_PROVIDER_SITE_OTHER): Payer: Medicare HMO | Admitting: Family Medicine

## 2018-05-04 ENCOUNTER — Encounter: Payer: Self-pay | Admitting: Family Medicine

## 2018-05-04 ENCOUNTER — Other Ambulatory Visit: Payer: Self-pay

## 2018-05-04 VITALS — BP 146/90 | HR 76 | Temp 98.4°F | Resp 16 | Ht 64.0 in | Wt 256.0 lb

## 2018-05-04 DIAGNOSIS — I10 Essential (primary) hypertension: Secondary | ICD-10-CM

## 2018-05-04 DIAGNOSIS — E7800 Pure hypercholesterolemia, unspecified: Secondary | ICD-10-CM | POA: Diagnosis not present

## 2018-05-04 DIAGNOSIS — Z0000 Encounter for general adult medical examination without abnormal findings: Secondary | ICD-10-CM

## 2018-05-04 DIAGNOSIS — Z0001 Encounter for general adult medical examination with abnormal findings: Secondary | ICD-10-CM | POA: Diagnosis not present

## 2018-05-04 DIAGNOSIS — Z1159 Encounter for screening for other viral diseases: Secondary | ICD-10-CM | POA: Diagnosis not present

## 2018-05-04 MED ORDER — DILTIAZEM HCL ER COATED BEADS 360 MG PO CP24: 360.0000 mg | ORAL_CAPSULE | Freq: Every day | ORAL | 3 refills | Status: DC

## 2018-05-04 MED ORDER — OMEPRAZOLE 40 MG PO CPDR: 40.0000 mg | DELAYED_RELEASE_CAPSULE | Freq: Every day | ORAL | 3 refills | Status: DC

## 2018-05-04 MED ORDER — MELOXICAM 15 MG PO TABS: 15.0000 mg | ORAL_TABLET | Freq: Every day | ORAL | 0 refills | Status: DC

## 2018-05-04 NOTE — Progress Notes (Signed)
Subjective:    Patient ID: [name], female    DOB: 20-Jan-1946, 73 y.o.   MRN: [id number]  HPI Patient is here for a complete physical exam.  Her last colonoscopy was in 2015.  At that time it revealed: ENDOSCOPIC IMPRESSION: 1. Two sessile polyps ranging between 3-12mm in size were found in the transverse colon; polypectomies were performed with a cold snare 2. Mild diverticulosis was noted in the left colon 3. The examination was otherwise normal RECOMMENDATIONS: If the polyp(s) removed today are proven to be adenomatous (pre-cancerous) polyps, you will need a repeat colonoscopy in 5 Years.  Therefore she is due for repeat colonoscopy this year.  Depression screen and fall screens were normal.  I reviewed the patient's annual wellness visit questionnaire.  She does report difficulty moving from place to place and walking due to arthritic pain in her legs.  Otherwise her review of systems is completely negative for any issues with her ADLs.  Her biggest issue seems to be mobility.  She is having pain in her neck that radiates down into the right side of her neck and even into the superior aspect of her shoulder.  This is concerning for cervical radiculopathy I suspect due to cervical degenerative disc disease.  She also has pain in her right shoulder with abduction greater than 90 degrees which is concerning for subacromial bursitis.  She continues to have low back pain with left-sided lumbar radiculopathy.  This improved when she took the prednisone recently but still is present on a daily basis.  She has bilateral knee arthritis.  She is taking Aleve once a day on occasion which seems to help however she is not taking anything regularly consistent.  She is due for her shingles shot.  Otherwise her immunizations are up-to-date.  Her mammogram was performed in March and was normal.  Due to her history of a hysterectomy, she does not require Pap smear. Immunization History  Administered  Date(s) Administered  . Influenza, High Dose Seasonal PF 12/03/2017  . Influenza,inj,Quad PF,6+ Mos 11/06/2015, 11/03/2016  . Pneumococcal Conjugate-13 10/20/2014  . Pneumococcal Polysaccharide-23 06/17/2012    Past Medical History:  Diagnosis Date  . Arthritis    arthritis -knees  . Colon polyps    benign- last colonoscopy 09-2008  . H/O seasonal allergies    no problems now  . Hyperlipidemia   . Hypertension   . Murmur, heart    Past Surgical History:  Procedure Laterality Date  . ABDOMINAL HYSTERECTOMY    . APPENDECTOMY     with gallbladder surgery  . BUNIONECTOMY Bilateral   . CHOLECYSTECTOMY     open-gallstones  . EXCISION OF ADNEXAL MASS Left August 03, 2013   at [facility]/right adnexal nodule,excision;benigh lymph node  . OOPHORECTOMY Bilateral 2008   ovarian cyst   Current Outpatient Medications on File Prior to Visit  Medication Sig Dispense Refill  . albuterol (VENTOLIN HFA) 108 (90 Base) MCG/ACT inhaler Inhale 2 puffs into the lungs every 4 (four) hours as needed for wheezing or shortness of breath. 1 Inhaler 2  . diltiazem (CARDIZEM) 120 MG tablet TAKE ONE TABLET BY MOUTH THREE TIMES DAILY 270 tablet 0  . furosemide (LASIX) 40 MG tablet TAKE 1 TABLET BY MOUTH ONCE DAILY 90 tablet 3  . lisinopril (PRINIVIL,ZESTRIL) 20 MG tablet TAKE 2 TABLETS BY MOUTH ONCE DAILY IN THE MORNING 180 tablet 3  . loratadine (CLARITIN) 10 MG tablet Take 10 mg by mouth daily.    Marland Kitchen  naproxen (NAPROSYN) 500 MG tablet TAKE 1 TABLET BY MOUTH TWICE DAILY AS NEEDED FOR  MILD  PAIN 180 tablet 0  . predniSONE (DELTASONE) 20 MG tablet 3 tabs poqday 1-2, 2 tabs poqday 3-4, 1 tab poqday 5-6 12 tablet 0  . tetrahydrozoline 0.05 % ophthalmic solution Place 1 drop into both eyes as needed (eye allergies).     No current facility-administered medications on file prior to visit.    No Known Allergies Social History   Socioeconomic History  . Marital status: Married    Spouse name: Not on file  .  Number of children: Not on file  . Years of education: Not on file  . Highest education level: Not on file  Occupational History  . Not on file  Social Needs  . Financial resource strain: Not on file  . Food insecurity:    Worry: Not on file    Inability: Not on file  . Transportation needs:    Medical: Not on file    Non-medical: Not on file  Tobacco Use  . Smoking status: Never Smoker  . Smokeless tobacco: Never Used  Substance and Sexual Activity  . Alcohol use: No  . Drug use: No  . Sexual activity: Yes  Lifestyle  . Physical activity:    Days per week: Not on file    Minutes per session: Not on file  . Stress: Not on file  Relationships  . Social connections:    Talks on phone: Not on file    Gets together: Not on file    Attends religious service: Not on file    Active member of club or organization: Not on file    Attends meetings of clubs or organizations: Not on file    Relationship status: Not on file  . Intimate partner violence:    Fear of current or ex partner: Not on file    Emotionally abused: Not on file    Physically abused: Not on file    Forced sexual activity: Not on file  Other Topics Concern  . Not on file  Social History Narrative  . Not on file   Family History  Problem Relation Age of Onset  . Colon cancer Mother 71  . Heart attack Father   . Breast cancer Paternal Aunt   . Esophageal cancer Neg Hx   . Pancreatic cancer Neg Hx   . Rectal cancer Neg Hx   . Stomach cancer Neg Hx       Review of Systems  All other systems reviewed and are negative.      Objective:   Physical Exam Vitals signs reviewed.  Constitutional:      General: She is not in acute distress.    Appearance: Normal appearance. She is normal weight. She is not ill-appearing, toxic-appearing or diaphoretic.  HENT:     Head: Normocephalic and atraumatic.     Right Ear: Tympanic membrane, ear canal and external ear normal. There is no impacted cerumen.     Left  Ear: Tympanic membrane, ear canal and external ear normal. There is no impacted cerumen.     Nose: Nose normal. No congestion or rhinorrhea.     Mouth/Throat:     Mouth: Mucous membranes are moist.     Pharynx: No oropharyngeal exudate or posterior oropharyngeal erythema.  Eyes:     General: No scleral icterus.       Right eye: No discharge.        Left eye:  No discharge.     Extraocular Movements: Extraocular movements intact.     Conjunctiva/sclera: Conjunctivae normal.     Pupils: Pupils are equal, round, and reactive to light.  Neck:     Musculoskeletal: Normal range of motion and neck supple. No neck rigidity or muscular tenderness.     Vascular: No carotid bruit.  Cardiovascular:     Rate and Rhythm: Normal rate and regular rhythm.     Pulses: Normal pulses.     Heart sounds: Murmur present. No friction rub. No gallop.   Pulmonary:     Effort: Pulmonary effort is normal. No respiratory distress.     Breath sounds: Normal breath sounds. No stridor. No wheezing, rhonchi or rales.  Chest:     Chest wall: No tenderness.  Abdominal:     General: Bowel sounds are normal. There is no distension.     Palpations: Abdomen is soft. There is no mass.     Tenderness: There is no abdominal tenderness. There is no right CVA tenderness, left CVA tenderness, guarding or rebound.     Hernia: No hernia is present.  Musculoskeletal:        General: No swelling, deformity or signs of injury.     Right shoulder: She exhibits decreased range of motion and tenderness.     Right knee: She exhibits decreased range of motion. Tenderness found.     Left knee: She exhibits decreased range of motion. Tenderness found.     Cervical back: She exhibits decreased range of motion and tenderness.     Lumbar back: She exhibits tenderness and pain.     Right lower leg: No edema.     Left lower leg: No edema.  Lymphadenopathy:     Cervical: No cervical adenopathy.  Skin:    General: Skin is warm.      Coloration: Skin is not jaundiced or pale.     Findings: No bruising, erythema, lesion or rash.  Neurological:     General: No focal deficit present.     Mental Status: She is alert and oriented to person, place, and time. Mental status is at baseline.     Cranial Nerves: No cranial nerve deficit.     Sensory: No sensory deficit.     Motor: No weakness.     Coordination: Coordination normal.     Gait: Gait normal.     Deep Tendon Reflexes: Reflexes normal.  Psychiatric:        Mood and Affect: Mood normal.        Behavior: Behavior normal.        Thought Content: Thought content normal.        Judgment: Judgment normal.           Assessment & Plan:  Routine general medical examination at a health care facility  Essential (primary) hypertension - Plan: CBC with Differential/Platelet, COMPLETE METABOLIC PANEL WITH GFR, Lipid panel  Pure hypercholesterolemia - Plan: CBC with Differential/Platelet, COMPLETE METABOLIC PANEL WITH GFR, Lipid panel  Encounter for hepatitis C screening test for low risk patient - Plan: Hepatitis C Antibody  I have asked the patient to discontinue Aleve.  We will switch her to meloxicam 15 mg a day to see if this will provide her better pain relief for her multiple polyarthralgias.  We discussed the possible renal toxicity as well as the possible gastrointestinal side effects.  Therefore I recommended that she take Prilosec 40 mg a day if she is going to take the  meloxicam daily to prevent ulcers.  I recommended that she get the shingles vaccine this year.  Otherwise her vaccines are up-to-date.  Also recommended that she get a colonoscopy and this is already been scheduled for November.  Her mammogram is up-to-date.  The remainder of her preventative care is up-to-date.  I will check a CBC, CMP, and fasting lipid panel.  I will also screen the patient for hepatitis C.  We will discontinue Cardizem 120 mg 3 times daily and switch to extended release Cardizem  360 mg once a day for convenience now that the patient has insurance

## 2018-05-05 LAB — COMPLETE METABOLIC PANEL WITH GFR
AG Ratio: 1.5 (calc) (ref 1.0–2.5)
ALT: 12 U/L (ref 6–29)
AST: 15 U/L (ref 10–35)
Albumin: 4.1 g/dL (ref 3.6–5.1)
Alkaline phosphatase (APISO): 63 U/L (ref 37–153)
BUN: 11 mg/dL (ref 7–25)
CO2: 30 mmol/L (ref 20–32)
Calcium: 9.2 mg/dL (ref 8.6–10.4)
Chloride: 100 mmol/L (ref 98–110)
Creat: 0.88 mg/dL (ref 0.60–0.93)
GFR, Est African American: 77 mL/min/{1.73_m2} (ref >=60)
GFR, Est Non African American: 67 mL/min/{1.73_m2} (ref >=60)
Globulin: 2.7 g/dL (calc) (ref 1.9–3.7)
Glucose, Bld: 106 mg/dL — ABNORMAL HIGH (ref 65–99)
Potassium: 3.8 mmol/L (ref 3.5–5.3)
Sodium: 142 mmol/L (ref 135–146)
Total Bilirubin: 0.4 mg/dL (ref 0.2–1.2)
Total Protein: 6.8 g/dL (ref 6.1–8.1)

## 2018-05-05 LAB — HEPATITIS C ANTIBODY
Hepatitis C Ab: NONREACTIVE
SIGNAL TO CUT-OFF: 0.01 (ref <1.00)

## 2018-05-05 LAB — CBC WITH DIFFERENTIAL/PLATELET
Absolute Monocytes: 570 cells/uL (ref 200–950)
Basophils Absolute: 53 cells/uL (ref 0–200)
Basophils Relative: 0.7 %
Eosinophils Absolute: 218 cells/uL (ref 15–500)
Eosinophils Relative: 2.9 %
HCT: 41.8 % (ref 35.0–45.0)
Hemoglobin: 14.3 g/dL (ref 11.7–15.5)
Lymphs Abs: 1695 cells/uL (ref 850–3900)
MCH: 29.1 pg (ref 27.0–33.0)
MCHC: 34.2 g/dL (ref 32.0–36.0)
MCV: 85 fL (ref 80.0–100.0)
MPV: 12.3 fL (ref 7.5–12.5)
Monocytes Relative: 7.6 %
Neutro Abs: 4965 cells/uL (ref 1500–7800)
Neutrophils Relative %: 66.2 %
Platelets: 231 10*3/uL (ref 140–400)
RBC: 4.92 10*6/uL (ref 3.80–5.10)
RDW: 12.3 % (ref 11.0–15.0)
Total Lymphocyte: 22.6 %
WBC: 7.5 10*3/uL (ref 3.8–10.8)

## 2018-05-05 LAB — LIPID PANEL
Cholesterol: 193 mg/dL (ref <200)
HDL: 48 mg/dL — ABNORMAL LOW (ref >=50)
LDL Cholesterol (Calc): 125 mg/dL (calc) — ABNORMAL HIGH
Non-HDL Cholesterol (Calc): 145 mg/dL (calc) — ABNORMAL HIGH (ref <130)
Total CHOL/HDL Ratio: 4 (calc) (ref <5.0)
Triglycerides: 95 mg/dL (ref <150)

## 2018-05-26 ENCOUNTER — Telehealth: Payer: Self-pay | Admitting: Family Medicine

## 2018-05-26 NOTE — Telephone Encounter (Signed)
Pt called and states that her back pain is not getting any better and wants to know if we can go ahead and order MRI or does she need to be seen again?

## 2018-05-27 ENCOUNTER — Other Ambulatory Visit: Payer: Self-pay | Admitting: Family Medicine

## 2018-05-27 DIAGNOSIS — M5416 Radiculopathy, lumbar region: Secondary | ICD-10-CM

## 2018-05-27 NOTE — Telephone Encounter (Signed)
I ordered an MRI of her lumbar spine for her low back pain radiating down her left leg.  Is this what she was referring to.  She was also having pain in her neck radiating down her arm.  If that is what she is referring to that would be an MRI of the C-spine.  Please clarify exactly where her pain is that she is wanting to image.

## 2018-05-27 NOTE — Telephone Encounter (Signed)
Called and spoke to pt and she is hurting in her lower back. Informed her of MRI being scheduled.

## 2018-06-16 ENCOUNTER — Ambulatory Visit
Admission: RE | Admit: 2018-06-16 | Discharge: 2018-06-16 | Disposition: A | Discharge status: Home / Self Care | Payer: Medicare HMO | Source: Ambulatory Visit | Attending: Family Medicine | Admitting: Family Medicine

## 2018-06-16 ENCOUNTER — Other Ambulatory Visit: Payer: Self-pay

## 2018-06-16 DIAGNOSIS — M48061 Spinal stenosis, lumbar region without neurogenic claudication: Secondary | ICD-10-CM | POA: Diagnosis not present

## 2018-06-16 DIAGNOSIS — M5416 Radiculopathy, lumbar region: Secondary | ICD-10-CM

## 2018-06-17 ENCOUNTER — Other Ambulatory Visit: Payer: Self-pay | Admitting: Family Medicine

## 2018-06-17 ENCOUNTER — Encounter: Payer: Self-pay | Admitting: Family Medicine

## 2018-06-17 DIAGNOSIS — M48061 Spinal stenosis, lumbar region without neurogenic claudication: Secondary | ICD-10-CM

## 2018-06-17 DIAGNOSIS — M5416 Radiculopathy, lumbar region: Secondary | ICD-10-CM

## 2018-06-24 DIAGNOSIS — M4316 Spondylolisthesis, lumbar region: Secondary | ICD-10-CM | POA: Diagnosis not present

## 2018-08-04 DIAGNOSIS — H40033 Anatomical narrow angle, bilateral: Secondary | ICD-10-CM | POA: Diagnosis not present

## 2018-08-04 DIAGNOSIS — H1045 Other chronic allergic conjunctivitis: Secondary | ICD-10-CM | POA: Diagnosis not present

## 2018-08-04 DIAGNOSIS — H04123 Dry eye syndrome of bilateral lacrimal glands: Secondary | ICD-10-CM | POA: Diagnosis not present

## 2018-08-04 DIAGNOSIS — H40013 Open angle with borderline findings, low risk, bilateral: Secondary | ICD-10-CM | POA: Diagnosis not present

## 2018-08-18 ENCOUNTER — Other Ambulatory Visit: Payer: Self-pay | Admitting: Family Medicine

## 2018-08-18 DIAGNOSIS — M7989 Other specified soft tissue disorders: Secondary | ICD-10-CM

## 2018-08-19 DIAGNOSIS — H40033 Anatomical narrow angle, bilateral: Secondary | ICD-10-CM | POA: Diagnosis not present

## 2018-08-19 DIAGNOSIS — H40013 Open angle with borderline findings, low risk, bilateral: Secondary | ICD-10-CM | POA: Diagnosis not present

## 2018-08-23 DIAGNOSIS — H40031 Anatomical narrow angle, right eye: Secondary | ICD-10-CM | POA: Diagnosis not present

## 2018-09-10 DIAGNOSIS — H40032 Anatomical narrow angle, left eye: Secondary | ICD-10-CM | POA: Diagnosis not present

## 2018-09-12 ENCOUNTER — Other Ambulatory Visit: Payer: Self-pay | Admitting: Family Medicine

## 2018-09-22 ENCOUNTER — Other Ambulatory Visit: Payer: Self-pay | Admitting: Family Medicine

## 2018-10-06 ENCOUNTER — Ambulatory Visit (INDEPENDENT_AMBULATORY_CARE_PROVIDER_SITE_OTHER): Payer: Medicare HMO | Admitting: Family Medicine

## 2018-10-06 DIAGNOSIS — Z23 Encounter for immunization: Secondary | ICD-10-CM

## 2018-10-21 ENCOUNTER — Other Ambulatory Visit: Payer: Self-pay

## 2018-10-22 ENCOUNTER — Ambulatory Visit (INDEPENDENT_AMBULATORY_CARE_PROVIDER_SITE_OTHER): Payer: Medicare HMO | Admitting: Family Medicine

## 2018-10-22 VITALS — BP 142/80 | HR 65 | Temp 98.6°F | Resp 14 | Wt 260.0 lb

## 2018-10-22 DIAGNOSIS — B028 Zoster with other complications: Secondary | ICD-10-CM

## 2018-10-22 MED ORDER — HYDROCODONE-ACETAMINOPHEN 5-325 MG PO TABS: 1.0000 | ORAL_TABLET | Freq: Four times a day (QID) | ORAL | 0 refills | Status: DC | PRN

## 2018-10-22 MED ORDER — VALACYCLOVIR HCL 1 G PO TABS: 1000.0000 mg | ORAL_TABLET | Freq: Three times a day (TID) | ORAL | 0 refills | Status: DC

## 2018-10-22 MED ORDER — VALACYCLOVIR HCL 1 G PO TABS: 1000.0000 mg | ORAL_TABLET | Freq: Three times a day (TID) | ORAL | 0 refills | Status: AC

## 2018-10-22 NOTE — Progress Notes (Signed)
Subjective:    Patient ID: [name], female    DOB: 1945-10-26, 73 y.o.   MRN: [id number]  HPI  Patient developed the pain in the center of her back roughly at the level of L4 earlier this week.  A rash started to develop in that area.  The rash consist of erythematous vesicles in a linear pattern radiating to the left.  Please see the picture below.  The rash itches and burns.   Past Medical History:  Diagnosis Date  . Arthritis    arthritis -knees  . Colon polyps    benign- last colonoscopy 09-2008  . H/O seasonal allergies    no problems now  . Hyperlipidemia   . Hypertension   . Murmur, heart   . Spinal stenosis of lumbar region at multiple levels    Past Surgical History:  Procedure Laterality Date  . ABDOMINAL HYSTERECTOMY    . APPENDECTOMY     with gallbladder surgery  . BUNIONECTOMY Bilateral   . CHOLECYSTECTOMY     open-gallstones  . EXCISION OF ADNEXAL MASS Left August 03, 2013   at [facility]/right adnexal nodule,excision;benigh lymph node  . OOPHORECTOMY Bilateral 2008   ovarian cyst   Current Outpatient Medications on File Prior to Visit  Medication Sig Dispense Refill  . albuterol (VENTOLIN HFA) 108 (90 Base) MCG/ACT inhaler Inhale 2 puffs into the lungs every 4 (four) hours as needed for wheezing or shortness of breath. 1 Inhaler 2  . diltiazem (CARDIZEM CD) 360 MG 24 hr capsule Take 1 capsule by mouth once daily 90 capsule 3  . furosemide (LASIX) 40 MG tablet Take 1 tablet by mouth once daily 90 tablet 2  . lisinopril (ZESTRIL) 20 MG tablet TAKE 2 TABLETS BY MOUTH ONCE DAILY IN THE MORNING 180 tablet 0  . loratadine (CLARITIN) 10 MG tablet Take 10 mg by mouth daily.    . meloxicam (MOBIC) 15 MG tablet Take 1 tablet (15 mg total) by mouth daily. 30 tablet 0  . omeprazole (PRILOSEC) 40 MG capsule Take 1 capsule (40 mg total) by mouth daily. 30 capsule 3  . tetrahydrozoline 0.05 % ophthalmic solution Place 1 drop into both eyes as needed (eye allergies).      No current facility-administered medications on file prior to visit.    No Known Allergies Social History   Socioeconomic History  . Marital status: Married    Spouse name: Not on file  . Number of children: Not on file  . Years of education: Not on file  . Highest education level: Not on file  Occupational History  . Not on file  Social Needs  . Financial resource strain: Not on file  . Food insecurity    Worry: Not on file    Inability: Not on file  . Transportation needs    Medical: Not on file    Non-medical: Not on file  Tobacco Use  . Smoking status: Never Smoker  . Smokeless tobacco: Never Used  Substance and Sexual Activity  . Alcohol use: No  . Drug use: No  . Sexual activity: Yes  Lifestyle  . Physical activity    Days per week: Not on file    Minutes per session: Not on file  . Stress: Not on file  Relationships  . Social Herbalist on phone: Not on file    Gets together: Not on file    Attends religious service: Not on file    Active  member of club or organization: Not on file    Attends meetings of clubs or organizations: Not on file    Relationship status: Not on file  . Intimate partner violence    Fear of current or ex partner: Not on file    Emotionally abused: Not on file    Physically abused: Not on file    Forced sexual activity: Not on file  Other Topics Concern  . Not on file  Social History Narrative  . Not on file     Review of Systems  All other systems reviewed and are negative.      Objective:   Physical Exam Vitals signs reviewed.  Cardiovascular:     Rate and Rhythm: Normal rate and regular rhythm.     Heart sounds: Normal heart sounds.  Pulmonary:     Effort: Pulmonary effort is normal.     Breath sounds: Normal breath sounds.  Skin:    Findings: Erythema and rash present.  Neurological:     Mental Status: She is alert.           Assessment & Plan:  Herpes zoster with complication  This is a  shingles outbreak.  Begin Valtrex 1 g p.o. 3 times daily for 7 days.  Right now is not hurting but I did give her hydrocodone/acetaminophen 5/325 1 p.o. every 6 hours as needed pain in case it begins to hurt worse

## 2018-10-22 NOTE — Addendum Note (Signed)
Addended by: Shary Decamp B on: 10/22/2018 04:46 PM   Modules accepted: Orders

## 2018-11-04 ENCOUNTER — Encounter: Payer: Self-pay | Admitting: Gastroenterology

## 2018-11-12 ENCOUNTER — Other Ambulatory Visit: Payer: Self-pay | Admitting: Family Medicine

## 2018-11-15 ENCOUNTER — Encounter: Payer: Self-pay | Admitting: Gastroenterology

## 2018-12-01 ENCOUNTER — Other Ambulatory Visit: Payer: Self-pay

## 2018-12-01 ENCOUNTER — Ambulatory Visit (AMBULATORY_SURGERY_CENTER): Payer: Medicare HMO | Admitting: *Deleted

## 2018-12-01 ENCOUNTER — Encounter: Payer: Self-pay | Admitting: Gastroenterology

## 2018-12-01 VITALS — Temp 96.9°F | Ht 64.0 in | Wt 259.0 lb

## 2018-12-01 DIAGNOSIS — Z800 Family history of malignant neoplasm of digestive organs: Secondary | ICD-10-CM

## 2018-12-01 DIAGNOSIS — Z1159 Encounter for screening for other viral diseases: Secondary | ICD-10-CM

## 2018-12-01 DIAGNOSIS — Z86010 Personal history of colonic polyps: Secondary | ICD-10-CM

## 2018-12-01 NOTE — Progress Notes (Signed)
No egg or soy allergy known to patient  No issues with past sedation with any surgeries  or procedures, no intubation problems  No diet pills per patient No home 02 use per patient  No blood thinners per patient  Pt denies issues with constipation  No A fib or A flutter  EMMI video sent to pt's e mail   Due to the COVID-19 pandemic we are asking patients to follow these guidelines. Please only bring one care partner. Please be aware that your care partner may wait in the car in the parking lot or if they feel like they will be too hot to wait in the car, they may wait in the lobby on the 4th floor. All care partners are required to wear a mask the entire time (we do not have any that we can provide them), they need to practice social distancing, and we will do a Covid check for all patient's and care partners when you arrive. Also we will check their temperature and your temperature. If the care partner waits in their car they need to stay in the parking lot the entire time and we will call them on their cell phone when the patient is ready for discharge so they can bring the car to the front of the building. Also all patient's will need to wear a mask into building.  covid test 11-13 at [phone]

## 2018-12-09 DIAGNOSIS — H5319 Other subjective visual disturbances: Secondary | ICD-10-CM | POA: Diagnosis not present

## 2018-12-09 DIAGNOSIS — H40013 Open angle with borderline findings, low risk, bilateral: Secondary | ICD-10-CM | POA: Diagnosis not present

## 2018-12-09 DIAGNOSIS — H40033 Anatomical narrow angle, bilateral: Secondary | ICD-10-CM | POA: Diagnosis not present

## 2018-12-10 ENCOUNTER — Other Ambulatory Visit: Payer: Self-pay | Admitting: Gastroenterology

## 2018-12-10 ENCOUNTER — Ambulatory Visit (INDEPENDENT_AMBULATORY_CARE_PROVIDER_SITE_OTHER): Payer: Medicare HMO

## 2018-12-10 DIAGNOSIS — Z1159 Encounter for screening for other viral diseases: Secondary | ICD-10-CM

## 2018-12-13 LAB — SARS CORONAVIRUS 2 (TAT 6-24 HRS): SARS Coronavirus 2: NEGATIVE

## 2018-12-15 ENCOUNTER — Encounter: Payer: Self-pay | Admitting: Gastroenterology

## 2018-12-15 ENCOUNTER — Other Ambulatory Visit: Payer: Self-pay

## 2018-12-15 ENCOUNTER — Ambulatory Visit (AMBULATORY_SURGERY_CENTER): Payer: Medicare HMO | Admitting: Gastroenterology

## 2018-12-15 VITALS — BP 136/83 | HR 77 | Temp 97.9°F | Resp 16 | Ht 64.0 in | Wt 259.0 lb

## 2018-12-15 DIAGNOSIS — Z800 Family history of malignant neoplasm of digestive organs: Secondary | ICD-10-CM | POA: Diagnosis not present

## 2018-12-15 DIAGNOSIS — Z86010 Personal history of colonic polyps: Secondary | ICD-10-CM | POA: Diagnosis not present

## 2018-12-15 DIAGNOSIS — K635 Polyp of colon: Secondary | ICD-10-CM | POA: Diagnosis not present

## 2018-12-15 DIAGNOSIS — D124 Benign neoplasm of descending colon: Secondary | ICD-10-CM

## 2018-12-15 MED ORDER — SODIUM CHLORIDE 0.9 % IV SOLN: 500.0000 mL | INTRAVENOUS | Status: DC

## 2018-12-15 NOTE — Op Note (Signed)
[place] Patient Name: [name] Procedure Date: 12/15/2018 8:18 AM MRN: [id number] Endoscopist: Milus Banister , MD Age: 73 Referring MD:  Date of Birth: Mar 24, 1945 Gender: Female Account #: [id number] Procedure:                Colonoscopy Indications:              High risk colon cancer surveillance: Personal                            history of colonic polyps; colonoscopy 2015 subCM                            TA and SSA, Mother had colon cancer diagnosed in                            her 79s, brother also had colon cancer. Medicines:                Monitored Anesthesia Care Procedure:                Pre-Anesthesia Assessment:                           - Prior to the procedure, a History and Physical                            was performed, and patient medications and                            allergies were reviewed. The patient's tolerance of                            previous anesthesia was also reviewed. The risks                            and benefits of the procedure and the sedation                            options and risks were discussed with the patient.                            All questions were answered, and informed consent                            was obtained. Prior Anticoagulants: The patient has                            taken no previous anticoagulant or antiplatelet                            agents. ASA Grade Assessment: III - A patient with                            severe systemic disease. After reviewing the risks  and benefits, the patient was deemed in                            satisfactory condition to undergo the procedure.                           After obtaining informed consent, the colonoscope                            was passed under direct vision. Throughout the                            procedure, the patient's blood pressure, pulse, and                            oxygen saturations were  monitored continuously. The                            Colonoscope was introduced through the anus and                            advanced to the the cecum, identified by                            appendiceal orifice and ileocecal valve. The                            colonoscopy was performed without difficulty. The                            patient tolerated the procedure well. The quality                            of the bowel preparation was good. The ileocecal                            valve, appendiceal orifice, and rectum were                            photographed. Scope In: 8:27:25 AM Scope Out: 8:39:14 AM Scope Withdrawal Time: 0 hours 8 minutes 59 seconds  Total Procedure Duration: 0 hours 11 minutes 49 seconds  Findings:                 A 3 mm polyp was found in the descending colon. The                            polyp was sessile. The polyp was removed with a                            cold snare. Resection and retrieval were complete.                           Multiple small and large-mouthed diverticula were  found in the left colon.                           The exam was otherwise without abnormality on                            direct and retroflexion views. Complications:            No immediate complications. Estimated blood loss:                            None. Estimated Blood Loss:     Estimated blood loss: none. Impression:               - One 3 mm polyp in the descending colon, removed                            with a cold snare. Resected and retrieved.                           - Diverticulosis in the left colon.                           - The examination was otherwise normal on direct                            and retroflexion views. Recommendation:           - Patient has a contact number available for                            emergencies. The signs and symptoms of potential                            delayed complications  were discussed with the                            patient. Return to normal activities tomorrow.                            Written discharge instructions were provided to the                            patient.                           - Resume previous diet.                           - Continue present medications.                           - Await pathology results. Likely repeat                            colonoscopy in 5 years given FH of colon cancer                            (  mother and brother) Milus Banister, MD 12/15/2018 8:42:20 AM This report has been signed electronically.

## 2018-12-15 NOTE — Patient Instructions (Signed)
YOU HAD AN ENDOSCOPIC PROCEDURE TODAY AT THE [facility]:   Refer to the procedure report that was given to you for any specific questions about what was found during the examination.  If the procedure report does not answer your questions, please call your gastroenterologist to clarify.  If you requested that your care partner not be given the details of your procedure findings, then the procedure report has been included in a sealed envelope for you to review at your convenience later.  YOU SHOULD EXPECT: Some feelings of bloating in the abdomen. Passage of more gas than usual.  Walking can help get rid of the air that was put into your GI tract during the procedure and reduce the bloating. If you had a lower endoscopy (such as a colonoscopy or flexible sigmoidoscopy) you may notice spotting of blood in your stool or on the toilet paper. If you underwent a bowel prep for your procedure, you may not have a normal bowel movement for a few days.  Please Note:  You might notice some irritation and congestion in your nose or some drainage.  This is from the oxygen used during your procedure.  There is no need for concern and it should clear up in a day or so.  SYMPTOMS TO REPORT IMMEDIATELY:   Following lower endoscopy (colonoscopy or flexible sigmoidoscopy):  Excessive amounts of blood in the stool  Significant tenderness or worsening of abdominal pains  Swelling of the abdomen that is new, acute  Fever of 100F or higher   For urgent or emergent issues, a gastroenterologist can be reached at any hour by calling [phone].   DIET:  We do recommend a small meal at first, but then you may proceed to your regular diet.  Drink plenty of fluids but you should avoid alcoholic beverages for 24 hours.  MEDICATIONS: Continue present medications.  Please see handouts given to you by your recovery nurse.  ACTIVITY:  You should plan to take it easy for the rest of today and you should  NOT DRIVE or use heavy machinery until tomorrow (because of the sedation medicines used during the test).    FOLLOW UP: Our staff will call the number listed on your records 48-72 hours following your procedure to check on you and address any questions or concerns that you may have regarding the information given to you following your procedure. If we do not reach you, we will leave a message.  We will attempt to reach you two times.  During this call, we will ask if you have developed any symptoms of COVID 19. If you develop any symptoms (ie: fever, flu-like symptoms, shortness of breath, cough etc.) before then, please call [phone].  If you test positive for Covid 19 in the 2 weeks post procedure, please call and report this information to us.    If any biopsies were taken you will be contacted by phone or by letter within the next 1-3 weeks.  Please call us at [phone] if you have not heard about the biopsies in 3 weeks.   Thank you for allowing us to provide for your healthcare needs today.   SIGNATURES/CONFIDENTIALITY: You and/or your care partner have signed paperwork which will be entered into your electronic medical record.  These signatures attest to the fact that that the information above on your After Visit Summary has been reviewed and is understood.  Full responsibility of the confidentiality of this discharge information lies with you and/or   your care-partner. 

## 2018-12-15 NOTE — Progress Notes (Signed)
PT taken to PACU. Monitors in place. VSS. Report given to RN. 

## 2018-12-15 NOTE — Progress Notes (Signed)
Temp JB  v/s CW I have reviewed the patient's medical history in detail and updated the computerized patient record. 

## 2018-12-15 NOTE — Progress Notes (Signed)
Called to room to assist during endoscopic procedure.  Patient ID and intended procedure confirmed with present staff. Received instructions for my participation in the procedure from the performing physician.  

## 2018-12-17 ENCOUNTER — Telehealth: Payer: Self-pay

## 2018-12-17 NOTE — Telephone Encounter (Signed)
  Follow up Call-  Call back number 12/15/2018  Post procedure Call Back phone  # [phone]  Permission to leave phone message Yes  Some recent data might be hidden     Patient questions:  Do you have a fever, pain , or abdominal swelling? No. Pain Score  0 *  Have you tolerated food without any problems? Yes.    Have you been able to return to your normal activities? Yes.    Do you have any questions about your discharge instructions: Diet   No. Medications  No. Follow up visit  No.  Do you have questions or concerns about your Care? No.  Actions: * If pain score is 4 or above: No action needed, pain <4.  1. Have you developed a fever since your procedure? No  2.   Have you had an respiratory symptoms (SOB or cough) since your procedure? No  3.   Have you tested positive for COVID 19 since your procedure No  4.   Have you had any family members/close contacts diagnosed with the COVID 19 since your procedure?  No   If yes to any of these questions please route to Joylene John, RN and Alphonsa Gin, RN.

## 2018-12-19 ENCOUNTER — Encounter: Payer: Self-pay | Admitting: Gastroenterology

## 2019-02-15 DIAGNOSIS — M25561 Pain in right knee: Secondary | ICD-10-CM | POA: Insufficient documentation

## 2019-02-16 DIAGNOSIS — M25562 Pain in left knee: Secondary | ICD-10-CM | POA: Diagnosis not present

## 2019-02-16 DIAGNOSIS — M25561 Pain in right knee: Secondary | ICD-10-CM | POA: Diagnosis not present

## 2019-03-03 ENCOUNTER — Other Ambulatory Visit: Payer: Self-pay | Admitting: Obstetrics and Gynecology

## 2019-03-03 DIAGNOSIS — Z1231 Encounter for screening mammogram for malignant neoplasm of breast: Secondary | ICD-10-CM

## 2019-03-04 DIAGNOSIS — Z01419 Encounter for gynecological examination (general) (routine) without abnormal findings: Secondary | ICD-10-CM | POA: Diagnosis not present

## 2019-03-04 DIAGNOSIS — Z0000 Encounter for general adult medical examination without abnormal findings: Secondary | ICD-10-CM | POA: Diagnosis not present

## 2019-03-09 ENCOUNTER — Other Ambulatory Visit: Payer: Self-pay | Admitting: Family Medicine

## 2019-04-06 DIAGNOSIS — H40033 Anatomical narrow angle, bilateral: Secondary | ICD-10-CM | POA: Diagnosis not present

## 2019-04-06 DIAGNOSIS — H35013 Changes in retinal vascular appearance, bilateral: Secondary | ICD-10-CM | POA: Diagnosis not present

## 2019-04-06 DIAGNOSIS — H35033 Hypertensive retinopathy, bilateral: Secondary | ICD-10-CM | POA: Diagnosis not present

## 2019-04-06 DIAGNOSIS — H524 Presbyopia: Secondary | ICD-10-CM | POA: Diagnosis not present

## 2019-04-06 DIAGNOSIS — H40013 Open angle with borderline findings, low risk, bilateral: Secondary | ICD-10-CM | POA: Diagnosis not present

## 2019-04-12 ENCOUNTER — Ambulatory Visit
Admission: RE | Admit: 2019-04-12 | Discharge: 2019-04-12 | Disposition: A | Discharge status: Home / Self Care | Payer: Medicare HMO | Source: Ambulatory Visit | Attending: Obstetrics and Gynecology | Admitting: Obstetrics and Gynecology

## 2019-04-12 ENCOUNTER — Other Ambulatory Visit: Payer: Self-pay

## 2019-04-12 DIAGNOSIS — Z1231 Encounter for screening mammogram for malignant neoplasm of breast: Secondary | ICD-10-CM | POA: Diagnosis not present

## 2019-04-19 ENCOUNTER — Other Ambulatory Visit: Payer: Self-pay

## 2019-04-19 ENCOUNTER — Encounter: Payer: Self-pay | Admitting: Family Medicine

## 2019-04-19 ENCOUNTER — Ambulatory Visit (INDEPENDENT_AMBULATORY_CARE_PROVIDER_SITE_OTHER): Payer: Medicare HMO | Admitting: Family Medicine

## 2019-04-19 VITALS — BP 154/100 | HR 78 | Temp 97.4°F | Resp 14 | Ht 64.0 in | Wt 258.0 lb

## 2019-04-19 DIAGNOSIS — M48061 Spinal stenosis, lumbar region without neurogenic claudication: Secondary | ICD-10-CM

## 2019-04-19 MED ORDER — PREDNISONE 20 MG PO TABS: ORAL_TABLET | ORAL | 0 refills | Status: DC

## 2019-04-19 NOTE — Progress Notes (Signed)
Subjective:    Patient ID: [name], female    DOB: [birth year], 74 y.o.   MRN: [id number]  Back Pain  3/20 Patient has a history of degenerative disc disease in her lumbar spine.  She had an MRI of the lumbar spine in 2008 which revealed a bulging disc at L3 and L4 causing effacement of the thecal sac.  Symptoms have been well controlled and absent up until last Monday.  She felt sudden pain in her lower back around the level of L4 while standing to get out of the bed.  She felt pain radiate down both of her legs.  It was a sharp intense burning pain.  Pain gradually improved.  However she has had low back pain the remainder of the week.  Saturday she was standing when she felt the pain again radiate from her lower back into both gluteus muscles and down the posterior aspect of both legs.  She states at that time that both legs started to feel weak.  She had to sit down.  There was some numbness in both legs.  Today she states that her legs feel fine.  She has no weakness in her legs.  She has no numbness in her legs.  She does have some residual low back pain.  She denies any saddle anesthesia or bowel or bladder incontinence.  AT that time, my plan was: Recommended the patient immediately get an x-ray of the lumbar spine.  I am concerned about spinal stenosis versus a herniated disc.  Today there is no evidence of cauda equina syndrome on exam.  The patient has normal muscle strength in both legs.  She has normal sensation in both legs and she has no evidence of bowel or bladder dysfunction.  I will start the patient on a prednisone taper pack to alleviate what I suspect is a bulging disc and calm the inflammation.  If pain continues to improve and x-ray is unremarkable, no further work-up is necessary at this time.  If pain persist, I would need an MRI of the lumbar spine to evaluate further  04/19/19 I copied the MRI results from last year below.  Patient has moderate to severe spinal stenosis  at several levels.  She did meet with a neurosurgeon who recommended against any type of surgery at that time. L2-L3: Central protrusion. Posterior element hypertrophy. Disc space narrowing. Short pedicles. Moderate to severe stenosis. BILATERAL L3 neural impingement.  L3-L4: Disc space narrowing. Central protrusion. Short pedicles. Posterior element hypertrophy. Moderate to severe stenosis. 4 x 8 mm synovial cyst on the RIGHT. RIGHT greater than LEFT L4 neural impingement. In addition, there appears to be an extraforaminal protrusion on the RIGHT affecting the RIGHT L3 nerve root (axial image 22), and a possible extra-spinal synovial cyst, 9 mm diameter (axial image 21).  L4-L5: 5 mm anterolisthesis. Disc space narrowing. Uncovering of the disc with shallow central protrusion. Posterior element hypertrophy. Short pedicles. Moderate to severe stenosis. BILATERAL L4 and L5 neural impingement.  L5-S1: Disc space narrowing. Central protrusion. Short pedicles. Facet arthropathy, greater on the LEFT. No definite S1 neural impingement. LEFT greater than RIGHT L5 neural impingement due to foraminal narrowing  Patient presents today with worsening lower back pain.  She reports pain shooting down both legs.  She stands for prolonged periods of time, both feet will go numb.  She reports that shooting aching pain going down her right leg and her posterior right hamstring down around her right knee and  across the anterior lateral surface of her right calf and shin.  This pain is made worse by prolonged standing and walking.  She is taking 2 Naprosyn a day but it is not helping the pain.  She denies any bowel or bladder incontinence.  She denies any saddle anesthesia.  She denies any fevers or chills. Past Medical History:  Diagnosis Date  . Allergy   . Arthritis    arthritis -knees  . Colon polyps    benign- last colonoscopy 09-2008  . Depression   . GERD (gastroesophageal reflux disease)     hx GERD  . H/O seasonal allergies    no problems now  . Hyperlipidemia   . Hypertension   . Murmur, heart   . Spinal stenosis of lumbar region at multiple levels    Past Surgical History:  Procedure Laterality Date  . ABDOMINAL HYSTERECTOMY    . APPENDECTOMY     with gallbladder surgery  . BUNIONECTOMY Bilateral   . CHOLECYSTECTOMY     open-gallstones  . COLONOSCOPY    . EXCISION OF ADNEXAL MASS Left August 03, 2013   at [facility]/right adnexal nodule,excision;benigh lymph node  . OOPHORECTOMY Bilateral 2008   ovarian cyst  . POLYPECTOMY     Current Outpatient Medications on File Prior to Visit  Medication Sig Dispense Refill  . albuterol (VENTOLIN HFA) 108 (90 Base) MCG/ACT inhaler Inhale 2 puffs into the lungs every 4 (four) hours as needed for wheezing or shortness of breath. 1 Inhaler 2  . Ascorbic Acid (VITAMIN C GUMMIES PO) Take by mouth. Natures Made Vit C gummies    . brimonidine (ALPHAGAN) 0.2 % ophthalmic solution     . diltiazem (CARDIZEM CD) 360 MG 24 hr capsule Take 1 capsule by mouth once daily 90 capsule 3  . diphenhydrAMINE (EQL ALLERGY RELIEF) 25 mg capsule Take 25 mg by mouth every 6 (six) hours as needed.    . dorzolamide-timolol (COSOPT) 22.3-6.8 MG/ML ophthalmic solution INSTILL 1 DROP INTO RIGHT EYE TWICE A DAY    . furosemide (LASIX) 40 MG tablet Take 1 tablet by mouth once daily 90 tablet 2  . Guaifenesin 1200 MG TB12 Take by mouth. Mucinex 1200 mg as needed    . HYDROcodone-acetaminophen (NORCO) 5-325 MG tablet Take 1 tablet by mouth every 6 (six) hours as needed for moderate pain. 30 tablet 0  . lisinopril (ZESTRIL) 20 MG tablet TAKE 2 TABLETS BY MOUTH ONCE DAILY IN THE MORNING 180 tablet 0  . loratadine (CLARITIN) 10 MG tablet Take 10 mg by mouth daily.    . meloxicam (MOBIC) 15 MG tablet Take 1 tablet (15 mg total) by mouth daily. 30 tablet 0  . naproxen (NAPROSYN) 500 MG tablet TAKE 1 TABLET BY MOUTH TWICE DAILY AS NEEDED FOR MILD PAIN 180 tablet 0  .  omeprazole (PRILOSEC) 40 MG capsule Take 1 capsule (40 mg total) by mouth daily. 30 capsule 3  . Polyethylene Glycol 3350 (MIRALAX PO) Take by mouth once. 238 garms for colon prep with Dulcolax 5 mg tabs x 4 for colon 11-18    . prednisoLONE acetate (PRED FORTE) 1 % ophthalmic suspension INSTILL 1 DROP INTO AFFECTED EYE(S) 4 TIMES DAILY USE AS DIRECTED ON DROP SHEET    . tetrahydrozoline 0.05 % ophthalmic solution Place 1 drop into both eyes as needed (eye allergies).     Current Facility-Administered Medications on File Prior to Visit  Medication Dose Route Frequency Provider Last Rate Last Admin  . 0.9 %  sodium chloride infusion  500 mL Intravenous Continuous Milus Banister, MD       No Known Allergies Social History   Socioeconomic History  . Marital status: Married    Spouse name: Not on file  . Number of children: Not on file  . Years of education: Not on file  . Highest education level: Not on file  Occupational History  . Not on file  Tobacco Use  . Smoking status: Never Smoker  . Smokeless tobacco: Never Used  Substance and Sexual Activity  . Alcohol use: No  . Drug use: No  . Sexual activity: Yes  Other Topics Concern  . Not on file  Social History Narrative  . Not on file   Social Determinants of Health   Financial Resource Strain:   . Difficulty of Paying Living Expenses:   Food Insecurity:   . Worried About Charity fundraiser in the Last Year:   . Arboriculturist in the Last Year:   Transportation Needs:   . Film/video editor (Medical):   Marland Kitchen Lack of Transportation (Non-Medical):   Physical Activity:   . Days of Exercise per Week:   . Minutes of Exercise per Session:   Stress:   . Feeling of Stress :   Social Connections:   . Frequency of Communication with Friends and Family:   . Frequency of Social Gatherings with Friends and Family:   . Attends Religious Services:   . Active Member of Clubs or Organizations:   . Attends Archivist  Meetings:   Marland Kitchen Marital Status:   Intimate Partner Violence:   . Fear of Current or Ex-Partner:   . Emotionally Abused:   Marland Kitchen Physically Abused:   . Sexually Abused:       Review of Systems  Musculoskeletal: Positive for back pain.  All other systems reviewed and are negative.      Objective:   Physical Exam  Constitutional: She appears well-developed and well-nourished.  Cardiovascular: Normal rate and regular rhythm.  Murmur heard. Pulmonary/Chest: Effort normal and breath sounds normal. No respiratory distress. She has no wheezes. She has no rales.  Musculoskeletal:     Lumbar back: Pain, tenderness and bony tenderness present. Decreased range of motion.       Back:  Vitals reviewed.         Assessment & Plan:  Spinal stenosis at L4-L5 level  I believe the pain in her back and in her right leg as well as the numbness in her left leg is due to her spinal stenosis most pronounced at L4-L5.  We will start the patient on a prednisone taper pack and then reassess in 1 week.  If the pain is not improving I will refer the patient for epidural steroid injections.  Patient's blood pressure today is elevated at 154/100.  I will have the patient check her blood pressure twice a day for the next few days and report to [place] next week when she gives [place] an update after prednisone.  If persistently greater than 140/90 we will need to increase her blood pressure medication to control blood pressure better.  Recheck next week.

## 2019-05-06 ENCOUNTER — Other Ambulatory Visit: Payer: Self-pay | Admitting: Family Medicine

## 2019-05-19 ENCOUNTER — Other Ambulatory Visit: Payer: Self-pay | Admitting: Family Medicine

## 2019-05-19 DIAGNOSIS — M7989 Other specified soft tissue disorders: Secondary | ICD-10-CM

## 2019-05-20 ENCOUNTER — Other Ambulatory Visit: Payer: Self-pay | Admitting: Family Medicine

## 2019-05-20 DIAGNOSIS — M7989 Other specified soft tissue disorders: Secondary | ICD-10-CM

## 2019-06-07 DIAGNOSIS — I10 Essential (primary) hypertension: Secondary | ICD-10-CM | POA: Diagnosis not present

## 2019-06-07 DIAGNOSIS — M4316 Spondylolisthesis, lumbar region: Secondary | ICD-10-CM | POA: Diagnosis not present

## 2019-06-07 DIAGNOSIS — Z6841 Body Mass Index (BMI) 40.0 and over, adult: Secondary | ICD-10-CM | POA: Diagnosis not present

## 2019-06-08 ENCOUNTER — Other Ambulatory Visit: Payer: Self-pay | Admitting: Family Medicine

## 2019-06-16 DIAGNOSIS — M4316 Spondylolisthesis, lumbar region: Secondary | ICD-10-CM | POA: Diagnosis not present

## 2019-06-16 DIAGNOSIS — M545 Low back pain: Secondary | ICD-10-CM | POA: Diagnosis not present

## 2019-06-23 DIAGNOSIS — M48061 Spinal stenosis, lumbar region without neurogenic claudication: Secondary | ICD-10-CM | POA: Diagnosis not present

## 2019-06-23 DIAGNOSIS — Z6841 Body Mass Index (BMI) 40.0 and over, adult: Secondary | ICD-10-CM | POA: Diagnosis not present

## 2019-06-23 DIAGNOSIS — I10 Essential (primary) hypertension: Secondary | ICD-10-CM | POA: Diagnosis not present

## 2019-06-23 DIAGNOSIS — M4316 Spondylolisthesis, lumbar region: Secondary | ICD-10-CM | POA: Diagnosis not present

## 2019-07-06 ENCOUNTER — Other Ambulatory Visit: Payer: Self-pay | Admitting: Family Medicine

## 2019-07-06 DIAGNOSIS — J209 Acute bronchitis, unspecified: Secondary | ICD-10-CM

## 2019-07-11 ENCOUNTER — Other Ambulatory Visit: Payer: Self-pay | Admitting: Family Medicine

## 2019-07-11 DIAGNOSIS — J209 Acute bronchitis, unspecified: Secondary | ICD-10-CM

## 2019-07-14 ENCOUNTER — Other Ambulatory Visit: Payer: Self-pay | Admitting: Family Medicine

## 2019-07-14 DIAGNOSIS — J209 Acute bronchitis, unspecified: Secondary | ICD-10-CM

## 2019-07-14 MED ORDER — ALBUTEROL SULFATE HFA 108 (90 BASE) MCG/ACT IN AERS: 2.0000 | INHALATION_SPRAY | RESPIRATORY_TRACT | 5 refills | Status: DC | PRN

## 2019-08-04 DIAGNOSIS — M792 Neuralgia and neuritis, unspecified: Secondary | ICD-10-CM | POA: Diagnosis not present

## 2019-08-04 DIAGNOSIS — Z604 Social exclusion and rejection: Secondary | ICD-10-CM | POA: Diagnosis not present

## 2019-08-04 DIAGNOSIS — R69 Illness, unspecified: Secondary | ICD-10-CM | POA: Diagnosis not present

## 2019-08-04 DIAGNOSIS — Z809 Family history of malignant neoplasm, unspecified: Secondary | ICD-10-CM | POA: Diagnosis not present

## 2019-08-04 DIAGNOSIS — M2550 Pain in unspecified joint: Secondary | ICD-10-CM | POA: Diagnosis not present

## 2019-08-04 DIAGNOSIS — Z791 Long term (current) use of non-steroidal anti-inflammatories (NSAID): Secondary | ICD-10-CM | POA: Diagnosis not present

## 2019-08-04 DIAGNOSIS — I10 Essential (primary) hypertension: Secondary | ICD-10-CM | POA: Diagnosis not present

## 2019-08-04 DIAGNOSIS — J45909 Unspecified asthma, uncomplicated: Secondary | ICD-10-CM | POA: Diagnosis not present

## 2019-08-04 DIAGNOSIS — G8929 Other chronic pain: Secondary | ICD-10-CM | POA: Diagnosis not present

## 2019-08-09 DIAGNOSIS — M48061 Spinal stenosis, lumbar region without neurogenic claudication: Secondary | ICD-10-CM | POA: Diagnosis not present

## 2019-08-18 ENCOUNTER — Other Ambulatory Visit: Payer: Self-pay | Admitting: Family Medicine

## 2019-08-18 DIAGNOSIS — M7989 Other specified soft tissue disorders: Secondary | ICD-10-CM

## 2019-08-22 ENCOUNTER — Other Ambulatory Visit: Payer: Self-pay | Admitting: Family Medicine

## 2019-08-22 DIAGNOSIS — M7989 Other specified soft tissue disorders: Secondary | ICD-10-CM

## 2019-08-23 DIAGNOSIS — H40013 Open angle with borderline findings, low risk, bilateral: Secondary | ICD-10-CM | POA: Diagnosis not present

## 2019-09-06 ENCOUNTER — Other Ambulatory Visit: Payer: Self-pay | Admitting: Family Medicine

## 2019-09-16 ENCOUNTER — Other Ambulatory Visit: Payer: Self-pay | Admitting: Family Medicine

## 2019-09-19 ENCOUNTER — Other Ambulatory Visit: Payer: Self-pay | Admitting: Family Medicine

## 2019-09-21 ENCOUNTER — Other Ambulatory Visit: Payer: Self-pay | Admitting: Family Medicine

## 2019-09-26 ENCOUNTER — Other Ambulatory Visit: Payer: Self-pay

## 2019-09-26 ENCOUNTER — Ambulatory Visit (INDEPENDENT_AMBULATORY_CARE_PROVIDER_SITE_OTHER): Payer: Medicare HMO | Admitting: Family Medicine

## 2019-09-26 VITALS — BP 150/70 | HR 79 | Temp 96.9°F | Ht 64.0 in | Wt 254.0 lb

## 2019-09-26 DIAGNOSIS — M5416 Radiculopathy, lumbar region: Secondary | ICD-10-CM | POA: Diagnosis not present

## 2019-09-26 DIAGNOSIS — M48061 Spinal stenosis, lumbar region without neurogenic claudication: Secondary | ICD-10-CM | POA: Diagnosis not present

## 2019-09-26 DIAGNOSIS — E7800 Pure hypercholesterolemia, unspecified: Secondary | ICD-10-CM

## 2019-09-26 DIAGNOSIS — I10 Essential (primary) hypertension: Secondary | ICD-10-CM

## 2019-09-26 MED ORDER — NAPROXEN 500 MG PO TABS
ORAL_TABLET | ORAL | 1 refills | Status: DC
Start: 2019-09-26 — End: 2020-05-29

## 2019-09-26 MED ORDER — DOXAZOSIN MESYLATE 2 MG PO TABS: 2.0000 mg | ORAL_TABLET | Freq: Every day | ORAL | 3 refills | Status: DC

## 2019-09-26 MED ORDER — OMEPRAZOLE 40 MG PO CPDR
40.0000 mg | DELAYED_RELEASE_CAPSULE | Freq: Every day | ORAL | 3 refills | Status: DC
Start: 2019-09-26 — End: 2019-09-29

## 2019-09-26 MED ORDER — GABAPENTIN 300 MG PO CAPS: 300.0000 mg | ORAL_CAPSULE | Freq: Three times a day (TID) | ORAL | 3 refills | Status: DC

## 2019-09-26 NOTE — Progress Notes (Signed)
Subjective:    Patient ID: [name], female    DOB: [birth year], 74 y.o.   MRN: [id number]  Back Pain  3/20 Patient has a history of degenerative disc disease in her lumbar spine.  She had an MRI of the lumbar spine in 2008 which revealed a bulging disc at L3 and L4 causing effacement of the thecal sac.  Symptoms have been well controlled and absent up until last Monday.  She felt sudden pain in her lower back around the level of L4 while standing to get out of the bed.  She felt pain radiate down both of her legs.  It was a sharp intense burning pain.  Pain gradually improved.  However she has had low back pain the remainder of the week.  Saturday she was standing when she felt the pain again radiate from her lower back into both gluteus muscles and down the posterior aspect of both legs.  She states at that time that both legs started to feel weak.  She had to sit down.  There was some numbness in both legs.  Today she states that her legs feel fine.  She has no weakness in her legs.  She has no numbness in her legs.  She does have some residual low back pain.  She denies any saddle anesthesia or bowel or bladder incontinence.  AT that time, my plan was: Recommended the patient immediately get an x-ray of the lumbar spine.  I am concerned about spinal stenosis versus a herniated disc.  Today there is no evidence of cauda equina syndrome on exam.  The patient has normal muscle strength in both legs.  She has normal sensation in both legs and she has no evidence of bowel or bladder dysfunction.  I will start the patient on a prednisone taper pack to alleviate what I suspect is a bulging disc and calm the inflammation.  If pain continues to improve and x-ray is unremarkable, no further work-up is necessary at this time.  If pain persist, I would need an MRI of the lumbar spine to evaluate further  04/19/19 I copied the MRI results from last year below.  Patient has moderate to severe spinal stenosis  at several levels.  She did meet with a neurosurgeon who recommended against any type of surgery at that time. L2-L3: Central protrusion. Posterior element hypertrophy. Disc space narrowing. Short pedicles. Moderate to severe stenosis. BILATERAL L3 neural impingement.  L3-L4: Disc space narrowing. Central protrusion. Short pedicles. Posterior element hypertrophy. Moderate to severe stenosis. 4 x 8 mm synovial cyst on the RIGHT. RIGHT greater than LEFT L4 neural impingement. In addition, there appears to be an extraforaminal protrusion on the RIGHT affecting the RIGHT L3 nerve root (axial image 22), and a possible extra-spinal synovial cyst, 9 mm diameter (axial image 21).  L4-L5: 5 mm anterolisthesis. Disc space narrowing. Uncovering of the disc with shallow central protrusion. Posterior element hypertrophy. Short pedicles. Moderate to severe stenosis. BILATERAL L4 and L5 neural impingement.  L5-S1: Disc space narrowing. Central protrusion. Short pedicles. Facet arthropathy, greater on the LEFT. No definite S1 neural impingement. LEFT greater than RIGHT L5 neural impingement due to foraminal narrowing  Patient presents today with worsening lower back pain.  She reports pain shooting down both legs.  She stands for prolonged periods of time, both feet will go numb.  She reports that shooting aching pain going down her right leg and her posterior right hamstring down around her right knee and  across the anterior lateral surface of her right calf and shin.  This pain is made worse by prolonged standing and walking.  She is taking 2 Naprosyn a day but it is not helping the pain.  She denies any bowel or bladder incontinence.  She denies any saddle anesthesia.  She denies any fevers or chills.  At that time, my plan was: I believe the pain in her back and in her right leg as well as the numbness in her left leg is due to her spinal stenosis most pronounced at L4-L5.  We will start the patient  on a prednisone taper pack and then reassess in 1 week.  If the pain is not improving I will refer the patient for epidural steroid injections.  Patient's blood pressure today is elevated at 154/100.  I will have the patient check her blood pressure twice a day for the next few days and report to [place] next week when she gives [place] an update after prednisone.  If persistently greater than 140/90 we will need to increase her blood pressure medication to control blood pressure better.  Recheck next week.  09/26/19 Wt Readings from Last 3 Encounters:  09/26/19 254 lb (115.2 kg)  04/19/19 258 lb (117 kg)  12/15/18 259 lb (117.5 kg)  Despite taking lisinopril and verapamil on a daily basis along with Lasix, the patient's blood pressure still elevated.  She denies any chest pain shortness of breath or dyspnea on exertion.  She continues to have severe pain in her lower back that radiates into both legs.  She states that the left leg will occasionally go numb and weak.  Neurosurgery has said the surgery is not an option however they are scheduling her for her second epidural steroid injection.  She is taking Naprosyn in the morning.  She takes Tylenol in the afternoons.  She will occasionally take hydrocodone.  She also reports pain going down the lateral aspect of her leg and stopping just below the head of the fibula.  She denies any saddle anesthesia or bowel or bladder incontinence.  She is due for lab work today.  Past Medical History:  Diagnosis Date  . Allergy   . Arthritis    arthritis -knees  . Colon polyps    benign- last colonoscopy 09-2008  . Depression   . GERD (gastroesophageal reflux disease)    hx GERD  . H/O seasonal allergies    no problems now  . Hyperlipidemia   . Hypertension   . Murmur, heart   . Spinal stenosis of lumbar region at multiple levels    Past Surgical History:  Procedure Laterality Date  . ABDOMINAL HYSTERECTOMY    . APPENDECTOMY     with gallbladder surgery  .  BUNIONECTOMY Bilateral   . CHOLECYSTECTOMY     open-gallstones  . COLONOSCOPY    . EXCISION OF ADNEXAL MASS Left August 03, 2013   at [facility] [place]/right adnexal nodule,excision;benigh lymph node  . OOPHORECTOMY Bilateral 2008   ovarian cyst  . POLYPECTOMY     Current Outpatient Medications on File Prior to Visit  Medication Sig Dispense Refill  . albuterol (VENTOLIN HFA) 108 (90 Base) MCG/ACT inhaler Inhale 2 puffs into the lungs every 4 (four) hours as needed for wheezing or shortness of breath. 16 g 5  . Ascorbic Acid (VITAMIN C GUMMIES PO) Take by mouth. Natures Made Vit C gummies    . brimonidine (ALPHAGAN) 0.2 % ophthalmic solution     . diltiazem (  CARDIZEM CD) 360 MG 24 hr capsule Take 1 capsule by mouth once daily 90 capsule 0  . diphenhydrAMINE (EQL ALLERGY RELIEF) 25 mg capsule Take 25 mg by mouth every 6 (six) hours as needed.    . dorzolamide-timolol (COSOPT) 22.3-6.8 MG/ML ophthalmic solution INSTILL 1 DROP INTO RIGHT EYE TWICE A DAY    . furosemide (LASIX) 40 MG tablet Take 1 tablet by mouth once daily 90 tablet 1  . Guaifenesin 1200 MG TB12 Take by mouth. Mucinex 1200 mg as needed    . HYDROcodone-acetaminophen (NORCO) 5-325 MG tablet Take 1 tablet by mouth every 6 (six) hours as needed for moderate pain. 30 tablet 0  . lisinopril (ZESTRIL) 20 MG tablet TAKE 2 TABLETS BY MOUTH ONCE DAILY IN THE MORNING 180 tablet 0  . loratadine (CLARITIN) 10 MG tablet Take 10 mg by mouth daily.    . meloxicam (MOBIC) 15 MG tablet Take 1 tablet (15 mg total) by mouth daily. 30 tablet 0  . naproxen (NAPROSYN) 500 MG tablet TAKE 1 TABLET BY MOUTH TWICE DAILY AS NEEDED FOR MILD PAIN 180 tablet 0  . omeprazole (PRILOSEC) 40 MG capsule Take 1 capsule (40 mg total) by mouth daily. 30 capsule 3  . Polyethylene Glycol 3350 (MIRALAX PO) Take by mouth once. 238 garms for colon prep with Dulcolax 5 mg tabs x 4 for colon 11-18    . prednisoLONE acetate (PRED FORTE) 1 % ophthalmic suspension INSTILL 1 DROP  INTO AFFECTED EYE(S) 4 TIMES DAILY USE AS DIRECTED ON DROP SHEET    . predniSONE (DELTASONE) 20 MG tablet 3 tabs poqday 1-2, 2 tabs poqday 3-4, 1 tab poqday 5-6 12 tablet 0  . tetrahydrozoline 0.05 % ophthalmic solution Place 1 drop into both eyes as needed (eye allergies).     Current Facility-Administered Medications on File Prior to Visit  Medication Dose Route Frequency Provider Last Rate Last Admin  . 0.9 %  sodium chloride infusion  500 mL Intravenous Continuous Milus Banister, MD       No Known Allergies Social History   Socioeconomic History  . Marital status: Married    Spouse name: Not on file  . Number of children: Not on file  . Years of education: Not on file  . Highest education level: Not on file  Occupational History  . Not on file  Tobacco Use  . Smoking status: Never Smoker  . Smokeless tobacco: Never Used  Substance and Sexual Activity  . Alcohol use: No  . Drug use: No  . Sexual activity: Yes  Other Topics Concern  . Not on file  Social History Narrative  . Not on file   Social Determinants of Health   Financial Resource Strain:   . Difficulty of Paying Living Expenses: Not on file  Food Insecurity:   . Worried About Charity fundraiser in the Last Year: Not on file  . Ran Out of Food in the Last Year: Not on file  Transportation Needs:   . Lack of Transportation (Medical): Not on file  . Lack of Transportation (Non-Medical): Not on file  Physical Activity:   . Days of Exercise per Week: Not on file  . Minutes of Exercise per Session: Not on file  Stress:   . Feeling of Stress : Not on file  Social Connections:   . Frequency of Communication with Friends and Family: Not on file  . Frequency of Social Gatherings with Friends and Family: Not on file  .  Attends Religious Services: Not on file  . Active Member of Clubs or Organizations: Not on file  . Attends Archivist Meetings: Not on file  . Marital Status: Not on file  Intimate  Partner Violence:   . Fear of Current or Ex-Partner: Not on file  . Emotionally Abused: Not on file  . Physically Abused: Not on file  . Sexually Abused: Not on file      Review of Systems  Musculoskeletal: Positive for back pain.  All other systems reviewed and are negative.      Objective:   Physical Exam Vitals reviewed.  Constitutional:      Appearance: She is well-developed.  Cardiovascular:     Rate and Rhythm: Normal rate and regular rhythm.     Heart sounds: Murmur heard.   Pulmonary:     Effort: Pulmonary effort is normal. No respiratory distress.     Breath sounds: Normal breath sounds. No wheezing or rales.  Musculoskeletal:     Lumbar back: Tenderness and bony tenderness present. Decreased range of motion.       Back:           Assessment & Plan:  Spinal stenosis at L4-L5 level  Lumbar radiculopathy  Essential (primary) hypertension - Plan: CBC with Differential/Platelet, COMPLETE METABOLIC PANEL WITH GFR, Lipid panel  Pure hypercholesterolemia - Plan: CBC with Differential/Platelet, COMPLETE METABOLIC PANEL WITH GFR, Lipid panel  Blood pressure is elevated.  We will add Cardura 2 mg a day and recheck blood pressure in 2 to 4 weeks.  Check CBC, CMP, fasting lipid panel.  Goal LDL cholesterol is less than 100.  We will add gabapentin 300 mg p.o. 3 times daily as needed nerve pain.  Continue Naprosyn.  Use Prilosec 40 mg a day to prevent stomach ulcers from the daily use of Naprosyn.  Monitor kidney function closely.  She can continue to use hydrocodone for breakthrough pain.  Patient has had her Covid vaccination.

## 2019-09-27 LAB — COMPLETE METABOLIC PANEL WITH GFR
AG Ratio: 1.7 (calc) (ref 1.0–2.5)
ALT: 12 U/L (ref 6–29)
AST: 14 U/L (ref 10–35)
Albumin: 4 g/dL (ref 3.6–5.1)
Alkaline phosphatase (APISO): 48 U/L (ref 37–153)
BUN/Creatinine Ratio: 14 (calc) (ref 6–22)
BUN: 14 mg/dL (ref 7–25)
CO2: 30 mmol/L (ref 20–32)
Calcium: 9.3 mg/dL (ref 8.6–10.4)
Chloride: 101 mmol/L (ref 98–110)
Creat: 0.97 mg/dL — ABNORMAL HIGH (ref 0.60–0.93)
GFR, Est African American: 68 mL/min/{1.73_m2} (ref >=60)
GFR, Est Non African American: 58 mL/min/{1.73_m2} — ABNORMAL LOW (ref >=60)
Globulin: 2.3 g/dL (calc) (ref 1.9–3.7)
Glucose, Bld: 103 mg/dL — ABNORMAL HIGH (ref 65–99)
Potassium: 3.4 mmol/L — ABNORMAL LOW (ref 3.5–5.3)
Sodium: 141 mmol/L (ref 135–146)
Total Bilirubin: 0.6 mg/dL (ref 0.2–1.2)
Total Protein: 6.3 g/dL (ref 6.1–8.1)

## 2019-09-27 LAB — CBC WITH DIFFERENTIAL/PLATELET
Absolute Monocytes: 504 cells/uL (ref 200–950)
Basophils Absolute: 50 cells/uL (ref 0–200)
Basophils Relative: 0.7 %
Eosinophils Absolute: 216 cells/uL (ref 15–500)
Eosinophils Relative: 3 %
HCT: 40.7 % (ref 35.0–45.0)
Hemoglobin: 13.5 g/dL (ref 11.7–15.5)
Lymphs Abs: 1354 cells/uL (ref 850–3900)
MCH: 28.8 pg (ref 27.0–33.0)
MCHC: 33.2 g/dL (ref 32.0–36.0)
MCV: 87 fL (ref 80.0–100.0)
MPV: 12 fL (ref 7.5–12.5)
Monocytes Relative: 7 %
Neutro Abs: 5076 cells/uL (ref 1500–7800)
Neutrophils Relative %: 70.5 %
Platelets: 227 10*3/uL (ref 140–400)
RBC: 4.68 10*6/uL (ref 3.80–5.10)
RDW: 12.9 % (ref 11.0–15.0)
Total Lymphocyte: 18.8 %
WBC: 7.2 10*3/uL (ref 3.8–10.8)

## 2019-09-27 LAB — LIPID PANEL
Cholesterol: 186 mg/dL (ref <200)
HDL: 48 mg/dL — ABNORMAL LOW (ref >=50)
LDL Cholesterol (Calc): 119 mg/dL (calc) — ABNORMAL HIGH
Non-HDL Cholesterol (Calc): 138 mg/dL (calc) — ABNORMAL HIGH (ref <130)
Total CHOL/HDL Ratio: 3.9 (calc) (ref <5.0)
Triglycerides: 89 mg/dL (ref <150)

## 2019-11-07 ENCOUNTER — Other Ambulatory Visit: Payer: Self-pay

## 2019-11-07 ENCOUNTER — Ambulatory Visit (INDEPENDENT_AMBULATORY_CARE_PROVIDER_SITE_OTHER): Payer: Medicare HMO | Admitting: Family Medicine

## 2019-11-07 VITALS — BP 140/80 | HR 97 | Temp 97.8°F | Ht 64.0 in | Wt 258.0 lb

## 2019-11-07 DIAGNOSIS — Z23 Encounter for immunization: Secondary | ICD-10-CM

## 2019-11-07 DIAGNOSIS — M48061 Spinal stenosis, lumbar region without neurogenic claudication: Secondary | ICD-10-CM

## 2019-11-07 DIAGNOSIS — M7989 Other specified soft tissue disorders: Secondary | ICD-10-CM

## 2019-11-07 LAB — BASIC METABOLIC PANEL WITH GFR
BUN: 14 mg/dL (ref 7–25)
CO2: 26 mmol/L (ref 20–32)
Calcium: 9.4 mg/dL (ref 8.6–10.4)
Chloride: 102 mmol/L (ref 98–110)
Creat: 0.85 mg/dL (ref 0.60–0.93)
GFR, Est African American: 79 mL/min/{1.73_m2} (ref >=60)
GFR, Est Non African American: 68 mL/min/{1.73_m2} (ref >=60)
Glucose, Bld: 139 mg/dL — ABNORMAL HIGH (ref 65–99)
Potassium: 4.1 mmol/L (ref 3.5–5.3)
Sodium: 140 mmol/L (ref 135–146)

## 2019-11-07 LAB — CBC WITH DIFFERENTIAL/PLATELET
Absolute Monocytes: 500 cells/uL (ref 200–950)
Basophils Absolute: 49 cells/uL (ref 0–200)
Basophils Relative: 0.6 %
Eosinophils Absolute: 287 cells/uL (ref 15–500)
Eosinophils Relative: 3.5 %
HCT: 42.2 % (ref 35.0–45.0)
Hemoglobin: 14.3 g/dL (ref 11.7–15.5)
Lymphs Abs: 1673 cells/uL (ref 850–3900)
MCH: 29.4 pg (ref 27.0–33.0)
MCHC: 33.9 g/dL (ref 32.0–36.0)
MCV: 86.7 fL (ref 80.0–100.0)
MPV: 12.2 fL (ref 7.5–12.5)
Monocytes Relative: 6.1 %
Neutro Abs: 5691 cells/uL (ref 1500–7800)
Neutrophils Relative %: 69.4 %
Platelets: 267 10*3/uL (ref 140–400)
RBC: 4.87 10*6/uL (ref 3.80–5.10)
RDW: 12.5 % (ref 11.0–15.0)
Total Lymphocyte: 20.4 %
WBC: 8.2 10*3/uL (ref 3.8–10.8)

## 2019-11-07 MED ORDER — TRAMADOL HCL 50 MG PO TABS: 50.0000 mg | ORAL_TABLET | Freq: Three times a day (TID) | ORAL | 0 refills | Status: AC | PRN

## 2019-11-07 NOTE — Progress Notes (Signed)
Subjective:    Patient ID: [name], female    DOB: [birth year], 74 y.o.   MRN: [id number]  Back Pain  3/20 Patient has a history of degenerative disc disease in her lumbar spine.  She had an MRI of the lumbar spine in 2008 which revealed a bulging disc at L3 and L4 causing effacement of the thecal sac.  Symptoms have been well controlled and absent up until last Monday.  She felt sudden pain in her lower back around the level of L4 while standing to get out of the bed.  She felt pain radiate down both of her legs.  It was a sharp intense burning pain.  Pain gradually improved.  However she has had low back pain the remainder of the week.  Saturday she was standing when she felt the pain again radiate from her lower back into both gluteus muscles and down the posterior aspect of both legs.  She states at that time that both legs started to feel weak.  She had to sit down.  There was some numbness in both legs.  Today she states that her legs feel fine.  She has no weakness in her legs.  She has no numbness in her legs.  She does have some residual low back pain.  She denies any saddle anesthesia or bowel or bladder incontinence.  AT that time, my plan was: Recommended the patient immediately get an x-ray of the lumbar spine.  I am concerned about spinal stenosis versus a herniated disc.  Today there is no evidence of cauda equina syndrome on exam.  The patient has normal muscle strength in both legs.  She has normal sensation in both legs and she has no evidence of bowel or bladder dysfunction.  I will start the patient on a prednisone taper pack to alleviate what I suspect is a bulging disc and calm the inflammation.  If pain continues to improve and x-ray is unremarkable, no further work-up is necessary at this time.  If pain persist, I would need an MRI of the lumbar spine to evaluate further  04/19/19 I copied the MRI results from last year below.  Patient has moderate to severe spinal stenosis  at several levels.  She did meet with a neurosurgeon who recommended against any type of surgery at that time. L2-L3: Central protrusion. Posterior element hypertrophy. Disc space narrowing. Short pedicles. Moderate to severe stenosis. BILATERAL L3 neural impingement.  L3-L4: Disc space narrowing. Central protrusion. Short pedicles. Posterior element hypertrophy. Moderate to severe stenosis. 4 x 8 mm synovial cyst on the RIGHT. RIGHT greater than LEFT L4 neural impingement. In addition, there appears to be an extraforaminal protrusion on the RIGHT affecting the RIGHT L3 nerve root (axial image 22), and a possible extra-spinal synovial cyst, 9 mm diameter (axial image 21).  L4-L5: 5 mm anterolisthesis. Disc space narrowing. Uncovering of the disc with shallow central protrusion. Posterior element hypertrophy. Short pedicles. Moderate to severe stenosis. BILATERAL L4 and L5 neural impingement.  L5-S1: Disc space narrowing. Central protrusion. Short pedicles. Facet arthropathy, greater on the LEFT. No definite S1 neural impingement. LEFT greater than RIGHT L5 neural impingement due to foraminal narrowing  Patient presents today with worsening lower back pain.  She reports pain shooting down both legs.  She stands for prolonged periods of time, both feet will go numb.  She reports that shooting aching pain going down her right leg and her posterior right hamstring down around her right knee and  across the anterior lateral surface of her right calf and shin.  This pain is made worse by prolonged standing and walking.  She is taking 2 Naprosyn a day but it is not helping the pain.  She denies any bowel or bladder incontinence.  She denies any saddle anesthesia.  She denies any fevers or chills.  At that time, my plan was: I believe the pain in her back and in her right leg as well as the numbness in her left leg is due to her spinal stenosis most pronounced at L4-L5.  We will start the patient  on a prednisone taper pack and then reassess in 1 week.  If the pain is not improving I will refer the patient for epidural steroid injections.  Patient's blood pressure today is elevated at 154/100.  I will have the patient check her blood pressure twice a day for the next few days and report to [place] next week when she gives [place] an update after prednisone.  If persistently greater than 140/90 we will need to increase her blood pressure medication to control blood pressure better.  Recheck next week.  09/26/19 Wt Readings from Last 3 Encounters:  09/26/19 254 lb (115.2 kg)  04/19/19 258 lb (117 kg)  12/15/18 259 lb (117.5 kg)  Despite taking lisinopril and verapamil on a daily basis along with Lasix, the patient's blood pressure still elevated.  She denies any chest pain shortness of breath or dyspnea on exertion.  She continues to have severe pain in her lower back that radiates into both legs.  She states that the left leg will occasionally go numb and weak.  Neurosurgery has said the surgery is not an option however they are scheduling her for her second epidural steroid injection.  She is taking Naprosyn in the morning.  She takes Tylenol in the afternoons.  She will occasionally take hydrocodone.  She also reports pain going down the lateral aspect of her leg and stopping just below the head of the fibula.  She denies any saddle anesthesia or bowel or bladder incontinence.  She is due for lab work today.  AT that time, my plan was: Blood pressure is elevated.  We will add Cardura 2 mg a day and recheck blood pressure in 2 to 4 weeks.  Check CBC, CMP, fasting lipid panel.  Goal LDL cholesterol is less than 100.  We will add gabapentin 300 mg p.o. 3 times daily as needed nerve pain.  Continue Naprosyn.  Use Prilosec 40 mg a day to prevent stomach ulcers from the daily use of Naprosyn.  Monitor kidney function closely.  She can continue to use hydrocodone for breakthrough pain.  Patient has had her Covid  vaccination.  11/07/19 Patient denies any chest pain.  However she does report worsening leg swelling.  She also reports some shortness of breath with activity.  Today on exam she has +1 pitting edema in both legs right greater than left.  She denies any angina.  She denies any orthopnea.  She denies any paroxysmal nocturnal dyspnea.  I believe some of the swelling is likely due to sedentary behavior due to her worsening low back pain.  She is wanting to increase her Naprosyn but she is already taking it twice a day every day.  I am concerned about possible ulcers and renal toxicity from overuse of NSAIDs.  Past Medical History:  Diagnosis Date  . Allergy   . Arthritis    arthritis -knees  . Colon  polyps    benign- last colonoscopy 09-2008  . Depression   . GERD (gastroesophageal reflux disease)    hx GERD  . H/O seasonal allergies    no problems now  . Hyperlipidemia   . Hypertension   . Murmur, heart   . Spinal stenosis of lumbar region at multiple levels    Past Surgical History:  Procedure Laterality Date  . ABDOMINAL HYSTERECTOMY    . APPENDECTOMY     with gallbladder surgery  . BUNIONECTOMY Bilateral   . CHOLECYSTECTOMY     open-gallstones  . COLONOSCOPY    . EXCISION OF ADNEXAL MASS Left August 03, 2013   at [facility] [place]/right adnexal nodule,excision;benigh lymph node  . OOPHORECTOMY Bilateral 2008   ovarian cyst  . POLYPECTOMY     Current Outpatient Medications on File Prior to Visit  Medication Sig Dispense Refill  . albuterol (VENTOLIN HFA) 108 (90 Base) MCG/ACT inhaler Inhale 2 puffs into the lungs every 4 (four) hours as needed for wheezing or shortness of breath. 16 g 5  . Ascorbic Acid (VITAMIN C GUMMIES PO) Take by mouth. Natures Made Vit C gummies    . brimonidine (ALPHAGAN) 0.2 % ophthalmic solution     . diltiazem (CARDIZEM CD) 360 MG 24 hr capsule Take 1 capsule by mouth once daily 90 capsule 0  . diphenhydrAMINE (EQL ALLERGY RELIEF) 25 mg capsule Take 25 mg  by mouth every 6 (six) hours as needed.    . dorzolamide-timolol (COSOPT) 22.3-6.8 MG/ML ophthalmic solution INSTILL 1 DROP INTO RIGHT EYE TWICE A DAY    . doxazosin (CARDURA) 2 MG tablet Take 1 tablet (2 mg total) by mouth daily. 30 tablet 3  . furosemide (LASIX) 40 MG tablet Take 1 tablet by mouth once daily 90 tablet 1  . gabapentin (NEURONTIN) 300 MG capsule Take 1 capsule (300 mg total) by mouth 3 (three) times daily. For nerve pain 90 capsule 3  . Guaifenesin 1200 MG TB12 Take by mouth. Mucinex 1200 mg as needed    . HYDROcodone-acetaminophen (NORCO) 5-325 MG tablet Take 1 tablet by mouth every 6 (six) hours as needed for moderate pain. 30 tablet 0  . lisinopril (ZESTRIL) 20 MG tablet TAKE 2 TABLETS BY MOUTH ONCE DAILY IN THE MORNING 180 tablet 0  . loratadine (CLARITIN) 10 MG tablet Take 10 mg by mouth daily.    . naproxen (NAPROSYN) 500 MG tablet TAKE 1 TABLET BY MOUTH TWICE DAILY AS NEEDED FOR MILD PAIN 180 tablet 1  . omeprazole (PRILOSEC) 40 MG capsule Take 1 capsule by mouth once daily 30 capsule 0  . Polyethylene Glycol 3350 (MIRALAX PO) Take by mouth once. 238 garms for colon prep with Dulcolax 5 mg tabs x 4 for colon 11-18    . prednisoLONE acetate (PRED FORTE) 1 % ophthalmic suspension INSTILL 1 DROP INTO AFFECTED EYE(S) 4 TIMES DAILY USE AS DIRECTED ON DROP SHEET    . tetrahydrozoline 0.05 % ophthalmic solution Place 1 drop into both eyes as needed (eye allergies).     Current Facility-Administered Medications on File Prior to Visit  Medication Dose Route Frequency Provider Last Rate Last Admin  . 0.9 %  sodium chloride infusion  500 mL Intravenous Continuous Milus Banister, MD       No Known Allergies Social History   Socioeconomic History  . Marital status: Married    Spouse name: Not on file  . Number of children: Not on file  . Years of education: Not on  file  . Highest education level: Not on file  Occupational History  . Not on file  Tobacco Use  . Smoking  status: Never Smoker  . Smokeless tobacco: Never Used  Substance and Sexual Activity  . Alcohol use: No  . Drug use: No  . Sexual activity: Yes  Other Topics Concern  . Not on file  Social History Narrative  . Not on file   Social Determinants of Health   Financial Resource Strain:   . Difficulty of Paying Living Expenses: Not on file  Food Insecurity:   . Worried About Charity fundraiser in the Last Year: Not on file  . Ran Out of Food in the Last Year: Not on file  Transportation Needs:   . Lack of Transportation (Medical): Not on file  . Lack of Transportation (Non-Medical): Not on file  Physical Activity:   . Days of Exercise per Week: Not on file  . Minutes of Exercise per Session: Not on file  Stress:   . Feeling of Stress : Not on file  Social Connections:   . Frequency of Communication with Friends and Family: Not on file  . Frequency of Social Gatherings with Friends and Family: Not on file  . Attends Religious Services: Not on file  . Active Member of Clubs or Organizations: Not on file  . Attends Archivist Meetings: Not on file  . Marital Status: Not on file  Intimate Partner Violence:   . Fear of Current or Ex-Partner: Not on file  . Emotionally Abused: Not on file  . Physically Abused: Not on file  . Sexually Abused: Not on file      Review of Systems  Musculoskeletal: Positive for back pain.  All other systems reviewed and are negative.      Objective:   Physical Exam Vitals reviewed.  Constitutional:      Appearance: She is well-developed.  Cardiovascular:     Rate and Rhythm: Normal rate and regular rhythm.     Heart sounds: Murmur heard.   Pulmonary:     Effort: Pulmonary effort is normal. No respiratory distress.     Breath sounds: Normal breath sounds. No wheezing or rales.  Musculoskeletal:     Lumbar back: Tenderness and bony tenderness present. Decreased range of motion.       Back:           Assessment & Plan:    Spinal stenosis of lumbar region at multiple levels  Leg swelling - Plan: Brain natriuretic peptide, BASIC METABOLIC PANEL WITH GFR, CBC with Differential/Platelet  I will schedule the patient for an echocardiogram of the heart to evaluate for possible congestive heart failure given her leg swelling and shortness of breath with activity.  Meanwhile I will check a BNP, BMP, and CBC.  I did give the patient a prescription for tramadol 1 tablet every 6 hours as needed for breakthrough pain so that she does not overuse NSAIDs and.  I also warned the patient about potential ulcers from overuse of NSAIDs as well as renal toxicity.

## 2019-11-08 LAB — BRAIN NATRIURETIC PEPTIDE: Brain Natriuretic Peptide: 42 pg/mL (ref <100)

## 2019-11-17 DIAGNOSIS — Z20822 Contact with and (suspected) exposure to covid-19: Secondary | ICD-10-CM | POA: Diagnosis not present

## 2019-11-21 DIAGNOSIS — M48061 Spinal stenosis, lumbar region without neurogenic claudication: Secondary | ICD-10-CM | POA: Diagnosis not present

## 2019-12-05 ENCOUNTER — Ambulatory Visit: Payer: Medicare HMO | Attending: Internal Medicine

## 2019-12-05 DIAGNOSIS — Z23 Encounter for immunization: Secondary | ICD-10-CM

## 2019-12-05 DIAGNOSIS — M4316 Spondylolisthesis, lumbar region: Secondary | ICD-10-CM | POA: Diagnosis not present

## 2019-12-05 DIAGNOSIS — M48061 Spinal stenosis, lumbar region without neurogenic claudication: Secondary | ICD-10-CM | POA: Diagnosis not present

## 2019-12-05 NOTE — Progress Notes (Signed)
   Covid-[facility] Vaccination [facility]  Name:  TNYA ADES    MRN: [id number] DOB: [birth year]  12/05/2019  Ms. Calbert was observed post Covid-19 immunization for 15 minutes without incident. She was provided with Vaccine Information Sheet and instruction to access the V-Safe system.   Ms. Millirons was instructed to call 911 with any severe reactions post vaccine: Marland Kitchen Difficulty breathing  . Swelling of face and throat  . A fast heartbeat  . A bad rash all over body  . Dizziness and weakness

## 2019-12-07 ENCOUNTER — Other Ambulatory Visit: Payer: Self-pay

## 2019-12-07 ENCOUNTER — Ambulatory Visit (HOSPITAL_COMMUNITY): Payer: Medicare HMO | Attending: Cardiovascular Disease

## 2019-12-07 ENCOUNTER — Other Ambulatory Visit: Payer: Self-pay | Admitting: Family Medicine

## 2019-12-07 DIAGNOSIS — R0609 Other forms of dyspnea: Secondary | ICD-10-CM | POA: Diagnosis not present

## 2019-12-07 DIAGNOSIS — I351 Nonrheumatic aortic (valve) insufficiency: Secondary | ICD-10-CM | POA: Diagnosis not present

## 2019-12-07 DIAGNOSIS — M7989 Other specified soft tissue disorders: Secondary | ICD-10-CM | POA: Insufficient documentation

## 2019-12-07 DIAGNOSIS — R0602 Shortness of breath: Secondary | ICD-10-CM | POA: Diagnosis not present

## 2019-12-07 DIAGNOSIS — R079 Chest pain, unspecified: Secondary | ICD-10-CM | POA: Insufficient documentation

## 2019-12-07 DIAGNOSIS — R600 Localized edema: Secondary | ICD-10-CM | POA: Diagnosis not present

## 2019-12-07 LAB — ECHOCARDIOGRAM COMPLETE
Area-P 1/2: 2.33 cm2
S' Lateral: 2.7 cm

## 2019-12-08 ENCOUNTER — Other Ambulatory Visit: Payer: Self-pay

## 2019-12-08 DIAGNOSIS — I515 Myocardial degeneration: Secondary | ICD-10-CM

## 2019-12-27 ENCOUNTER — Encounter: Payer: Self-pay | Admitting: Internal Medicine

## 2019-12-27 ENCOUNTER — Ambulatory Visit: Payer: Medicare HMO | Admitting: Internal Medicine

## 2019-12-27 ENCOUNTER — Other Ambulatory Visit: Payer: Self-pay

## 2019-12-27 VITALS — BP 140/82 | HR 79 | Ht 64.0 in | Wt 259.6 lb

## 2019-12-27 DIAGNOSIS — R600 Localized edema: Secondary | ICD-10-CM | POA: Diagnosis not present

## 2019-12-27 DIAGNOSIS — E7800 Pure hypercholesterolemia, unspecified: Secondary | ICD-10-CM

## 2019-12-27 DIAGNOSIS — I10 Essential (primary) hypertension: Secondary | ICD-10-CM

## 2019-12-27 DIAGNOSIS — I5030 Unspecified diastolic (congestive) heart failure: Secondary | ICD-10-CM | POA: Diagnosis not present

## 2019-12-27 LAB — COMPREHENSIVE METABOLIC PANEL
ALT: 17 IU/L (ref 0–32)
AST: 9 IU/L (ref 0–40)
Albumin/Globulin Ratio: 1.9 (ref 1.2–2.2)
Albumin: 4.1 g/dL (ref 3.7–4.7)
Alkaline Phosphatase: 58 IU/L (ref 44–121)
BUN/Creatinine Ratio: 12 (ref 12–28)
BUN: 13 mg/dL (ref 8–27)
Bilirubin Total: 0.3 mg/dL (ref 0.0–1.2)
CO2: 31 mmol/L — ABNORMAL HIGH (ref 20–29)
Calcium: 9.3 mg/dL (ref 8.7–10.3)
Chloride: 98 mmol/L (ref 96–106)
Creatinine, Ser: 1.05 mg/dL — ABNORMAL HIGH (ref 0.57–1.00)
GFR calc Af Amer: 61 mL/min/{1.73_m2} (ref >59)
GFR calc non Af Amer: 53 mL/min/{1.73_m2} — ABNORMAL LOW (ref >59)
Globulin, Total: 2.2 g/dL (ref 1.5–4.5)
Glucose: 99 mg/dL (ref 65–99)
Potassium: 3.3 mmol/L — ABNORMAL LOW (ref 3.5–5.2)
Sodium: 142 mmol/L (ref 134–144)
Total Protein: 6.3 g/dL (ref 6.0–8.5)

## 2019-12-27 LAB — TSH: TSH: 2.02 u[IU]/mL (ref 0.450–4.500)

## 2019-12-27 LAB — CBC
Hematocrit: 38.9 % (ref 34.0–46.6)
Hemoglobin: 13.3 g/dL (ref 11.1–15.9)
MCH: 29.5 pg (ref 26.6–33.0)
MCHC: 34.2 g/dL (ref 31.5–35.7)
MCV: 86 fL (ref 79–97)
Platelets: 202 10*3/uL (ref 150–450)
RBC: 4.51 x10E6/uL (ref 3.77–5.28)
RDW: 12 % (ref 11.7–15.4)
WBC: 7 10*3/uL (ref 3.4–10.8)

## 2019-12-27 MED ORDER — SPIRONOLACTONE 25 MG PO TABS: 12.5000 mg | ORAL_TABLET | Freq: Every day | ORAL | 3 refills | Status: DC

## 2019-12-27 MED ORDER — LISINOPRIL 40 MG PO TABS: 40.0000 mg | ORAL_TABLET | Freq: Every day | ORAL | 3 refills | Status: DC

## 2019-12-27 NOTE — Patient Instructions (Signed)
Medication Instructions:  Your physician has recommended you make the following change in your medication:   START: spironolactone (aldactone) 25 mg tablet: Take 1/2 tablet (12.5 mg) by mouth once a day  *If you need a refill on your cardiac medications before your next appointment, please call your pharmacy*   Lab Work: TODAY: CBC, CMET, TSH  Your physician recommends that you return for lab work in: 7-10 days for BMET and MG  If you have labs (blood work) drawn today and your tests are completely normal, you will receive your results only by: Marland Kitchen MyChart Message (if you have MyChart) OR . A paper copy in the mail If you have any lab test that is abnormal or we need to change your treatment, we will call you to review the results.   Testing/Procedures: Your physician recommends that you have a Cardiac MRI   Follow-Up: At [facility], you and your health needs are our priority.  As part of our continuing mission to provide you with exceptional heart care, we have created designated Provider Care Teams.  These Care Teams include your primary Cardiologist (physician) and Advanced Practice Providers (APPs -  Physician Assistants and Nurse Practitioners) who all work together to provide you with the care you need, when you need it.  We recommend signing up for the patient portal called "MyChart".  Sign up information is provided on this After Visit Summary.  MyChart is used to connect with patients for Virtual Visits (Telemedicine).  Patients are able to view lab/test results, encounter notes, upcoming appointments, etc.  Non-urgent messages can be sent to your provider as well.   To learn more about what you can do with MyChart, go to [email or web address].    Your next appointment:   3 month(s)  The format for your next appointment:   In Person  Provider:   Rudean Haskell, MD   Other Instructions We recommend that you wear compression stockings

## 2019-12-27 NOTE — Progress Notes (Signed)
Cardiology Office Note:    Date:  12/27/2019   ID:  [name], DOB 18-Nov-1945, MRN [id number]  PCP:  [name], MD  [facility] Cardiologist:  Werner Lean, MD  [place] Electrophysiologist:  None   CC: Leg Swelling Consulted for the evaluation of leg swelling at the behest of [name], MD  History of Present Illness:    [name] is a 74 y.o. female with a hx of Morbid Obesity,  HTN, HLD presenting for evaluation.  Patient notes that she has been having swelling in her right leg.  This has been going on for a couple months.  Notes knees and foot pain but no calf pain.  No blood clots or surgery.  No left leg swelling.  No change on the lasix, though she was releasing fluid.  Notes no shortness of breath.  No DOE, can walk a block, but is relatively sedentary.  No SOB after a big meal.  No palpitations.  No syncope or near syncope.  Notes chest pain or chest pressure.  Notes occasional funny feeling in her sternum:  This is not brought on by exercise or improved by rest.  Last time occurred last week.  Ambulatory BP on average 150-165/90-115  Past Medical History:  Diagnosis Date  . Allergy   . Arthritis    arthritis -knees  . Colon polyps    benign- last colonoscopy 09-2008  . Depression   . GERD (gastroesophageal reflux disease)    hx GERD  . H/O seasonal allergies    no problems now  . Hyperlipidemia   . Hypertension   . Murmur, heart   . Spinal stenosis of lumbar region at multiple levels     Past Surgical History:  Procedure Laterality Date  . ABDOMINAL HYSTERECTOMY    . APPENDECTOMY     with gallbladder surgery  . BUNIONECTOMY Bilateral   . CHOLECYSTECTOMY     open-gallstones  . COLONOSCOPY    . EXCISION OF ADNEXAL MASS Left August 03, 2013   at [facility]/right adnexal nodule,excision;benigh lymph node  . OOPHORECTOMY Bilateral 2008   ovarian cyst  . POLYPECTOMY      Current Medications: Current Meds   Medication Sig  . albuterol (VENTOLIN HFA) 108 (90 Base) MCG/ACT inhaler Inhale 2 puffs into the lungs every 4 (four) hours as needed for wheezing or shortness of breath.  . Ascorbic Acid (VITAMIN C GUMMIES PO) Take by mouth. Natures Made Vit C gummies  . brimonidine (ALPHAGAN) 0.2 % ophthalmic solution   . diltiazem (CARDIZEM CD) 360 MG 24 hr capsule Take 1 capsule by mouth once daily  . diphenhydrAMINE (EQL ALLERGY RELIEF) 25 mg capsule Take 25 mg by mouth every 6 (six) hours as needed.  . dorzolamide-timolol (COSOPT) 22.3-6.8 MG/ML ophthalmic solution INSTILL 1 DROP INTO RIGHT EYE TWICE A DAY  . doxazosin (CARDURA) 2 MG tablet Take 1 tablet (2 mg total) by mouth daily.  . furosemide (LASIX) 40 MG tablet Take 1 tablet by mouth once daily  . gabapentin (NEURONTIN) 300 MG capsule Take 1 capsule (300 mg total) by mouth 3 (three) times daily. For nerve pain  . Guaifenesin 1200 MG TB12 Take by mouth. Mucinex 1200 mg as needed  . HYDROcodone-acetaminophen (NORCO) 5-325 MG tablet Take 1 tablet by mouth every 6 (six) hours as needed for moderate pain.  Marland Kitchen loratadine (CLARITIN) 10 MG tablet Take 10 mg by mouth daily.  . naproxen (NAPROSYN) 500 MG tablet TAKE  1 TABLET BY MOUTH TWICE DAILY AS NEEDED FOR MILD PAIN  . omeprazole (PRILOSEC) 40 MG capsule Take 1 capsule by mouth once daily  . Polyethylene Glycol 3350 (MIRALAX PO) Take by mouth once. 238 garms for colon prep with Dulcolax 5 mg tabs x 4 for colon 11-18  . prednisoLONE acetate (PRED FORTE) 1 % ophthalmic suspension INSTILL 1 DROP INTO AFFECTED EYE(S) 4 TIMES DAILY USE AS DIRECTED ON DROP SHEET  . tetrahydrozoline 0.05 % ophthalmic solution Place 1 drop into both eyes as needed (eye allergies).  . [DISCONTINUED] lisinopril (ZESTRIL) 20 MG tablet TAKE 2 TABLETS BY MOUTH ONCE DAILY IN THE MORNING   Current Facility-Administered Medications for the 12/27/19 encounter (Office Visit) with Werner Lean, MD  Medication  . 0.9 %  sodium  chloride infusion     Allergies:   Patient has no known allergies.   Social History   Socioeconomic History  . Marital status: Married    Spouse name: Not on file  . Number of children: Not on file  . Years of education: Not on file  . Highest education level: Not on file  Occupational History  . Not on file  Tobacco Use  . Smoking status: Never Smoker  . Smokeless tobacco: Never Used  Substance and Sexual Activity  . Alcohol use: No  . Drug use: No  . Sexual activity: Yes  Other Topics Concern  . Not on file  Social History Narrative  . Not on file   Social Determinants of Health   Financial Resource Strain:   . Difficulty of Paying Living Expenses: Not on file  Food Insecurity:   . Worried About Charity fundraiser in the Last Year: Not on file  . Ran Out of Food in the Last Year: Not on file  Transportation Needs:   . Lack of Transportation (Medical): Not on file  . Lack of Transportation (Non-Medical): Not on file  Physical Activity:   . Days of Exercise per Week: Not on file  . Minutes of Exercise per Session: Not on file  Stress:   . Feeling of Stress : Not on file  Social Connections:   . Frequency of Communication with Friends and Family: Not on file  . Frequency of Social Gatherings with Friends and Family: Not on file  . Attends Religious Services: Not on file  . Active Member of Clubs or Organizations: Not on file  . Attends Archivist Meetings: Not on file  . Marital Status: Not on file    Family History: The patient's family history includes Breast cancer in her paternal aunt; Colon cancer (age of onset: 60) in her mother; Colon cancer (age of onset: 42) in her brother; Colon polyps in her brother; Heart attack in her father. There is no history of Esophageal cancer, Pancreatic cancer, Rectal cancer, or Stomach cancer. Father had MI and died at 60- no heart catheterization Son had MI and died at age 33- no heart catheterization Brother had  CHF. No family history of hypertrophic cardiomyopathy. Brother- died in 75s.  ROS:   Please see the history of present illness.    All other systems reviewed and are negative.  EKGs/Labs/Other Studies Reviewed:    The following studies were reviewed today:  EKG:  EKG is ordered today.  The ekg ordered today demonstrates sinus rhythm rate 79 no pathologic q waves  Recent Labs: 09/26/2019: ALT 12 11/07/2019: Brain Natriuretic Peptide 42; BUN 14; Creat 0.85; Hemoglobin 14.3; Platelets 267;  Potassium 4.1; Sodium 140  Recent Lipid Panel    Component Value Date/Time   CHOL 186 09/26/2019 0827   TRIG 89 09/26/2019 0827   HDL 48 (L) 09/26/2019 0827   CHOLHDL 3.9 09/26/2019 0827   VLDL 20 10/29/2015 0902   LDLCALC 119 (H) 09/26/2019 0827   Echo 12/07/19: Basal Septal Hypertrophy with Chordal SAM,Septal basal ration ~ 1.3 IMPRESSIONS  1. Left ventricular ejection fraction, by estimation, is 60 to 65%. The  left ventricle has normal function. The left ventricle has no regional  wall motion abnormalities. There is severe asymmetric left ventricular  hypertrophy of the basal and septal  segments. Left ventricular diastolic parameters are consistent with Grade  I diastolic dysfunction (impaired relaxation).  2. Right ventricular systolic function is normal. The right ventricular  size is normal.  3. Left atrial size was mildly dilated.  4. The mitral valve is normal in structure. No evidence of mitral valve  regurgitation. No evidence of mitral stenosis.  5. The aortic valve is tricuspid. Aortic valve regurgitation is trivial.  Mild to moderate aortic valve sclerosis/calcification is present, without  any evidence of aortic stenosis.  6. The inferior vena cava is normal in size with greater than 50%  respiratory variability, suggesting right atrial pressure of 3 mmHg.   Risk Assessment/Calculations:   N/A  Physical Exam:    VS:  BP 140/82   Pulse 79   Ht 5\' 4"  (1.626 m)    Wt 259 lb 9.6 oz (117.8 kg)   SpO2 96%   BMI 44.56 kg/m     Wt Readings from Last 3 Encounters:  12/27/19 259 lb 9.6 oz (117.8 kg)  11/07/19 258 lb (117 kg)  09/26/19 254 lb (115.2 kg)     GEN: Obese female, well developed in no acute distress HEENT: Normal NECK: No JVD; No carotid bruits LYMPHATICS: No lymphadenopathy CARDIAC: RRR, no murmurs, rubs, gallops RESPIRATORY:  Clear to auscultation without rales, wheezing or rhonchi  ABDOMEN: Soft, non-tender, non-distended MUSCULOSKELETAL:  +2 edema; No deformity  SKIN: Warm and dry NEUROLOGIC:  Alert and oriented x 3 PSYCHIATRIC:  Normal affect   ASSESSMENT:    1. Heart failure with preserved ejection fraction, unspecified HF chronicity ([place])   2. Lower extremity edema   3. Essential (primary) hypertension   4. Pure hypercholesterolemia    PLAN:    In order of problems listed above:  HFpEF Lower extremity edema (bilateral)  - notable basal septal hypertrophy - discussed CMR and CBC for eval of HCM - will start compression stockings - CMP, and thyroid studies for rule out hypoproteinemia thyroid disease based on prior testing - on no iatrogenic medications - if no improvement by follow up visit, with no other cause, will send to vein/vascular specialist for evaluation   Essential Hypertension,  - ambulatory blood pressure above goal, will continue ambulatory BP monitoring; gave education on how to perform ambulatory blood pressure monitoring including the frequency and technique; goal ambulatory blood pressure < 135/85 on average - continue home medications with the exception of will transition to 40 mg Po daily - Will start aldactone 12.5 mg daily - will will get labs in 7-10 days (BMET, Mg) - discussed diet (DASH/low sodium), and exercise/weight loss interventions  3 months follow up unless new symptoms or abnormal test results warranting change in plan  Would be reasonable for Virtual Follow up Would be reasonable  for APP Follow up    Shared Decision Making/Informed Consent  Amenable to CMR  Medication Adjustments/Labs and Tests Ordered: Current medicines are reviewed at length with the patient today.  Concerns regarding medicines are outlined above.  Orders Placed This Encounter  Procedures  . MR Card Morphology Wo/W Cm  . CBC  . Comprehensive metabolic panel  . TSH  . Magnesium  . Basic metabolic panel  . EKG 12-Lead   Meds ordered this encounter  Medications  . spironolactone (ALDACTONE) 25 MG tablet    Sig: Take 0.5 tablets (12.5 mg total) by mouth daily.    Dispense:  45 tablet    Refill:  3  . lisinopril (ZESTRIL) 40 MG tablet    Sig: Take 1 tablet (40 mg total) by mouth daily.    Dispense:  90 tablet    Refill:  3    Patient Instructions  Medication Instructions:  Your physician has recommended you make the following change in your medication:   START: spironolactone (aldactone) 25 mg tablet: Take 1/2 tablet (12.5 mg) by mouth once a day  *If you need a refill on your cardiac medications before your next appointment, please call your pharmacy*   Lab Work: TODAY: CBC, CMET, TSH  Your physician recommends that you return for lab work in: 7-10 days for BMET and MG  If you have labs (blood work) drawn today and your tests are completely normal, you will receive your results only by: Marland Kitchen MyChart Message (if you have MyChart) OR . A paper copy in the mail If you have any lab test that is abnormal or we need to change your treatment, we will call you to review the results.   Testing/Procedures: Your physician recommends that you have a Cardiac MRI   Follow-Up: At [facility], you and your health needs are our priority.  As part of our continuing mission to provide you with exceptional heart care, we have created designated Provider Care Teams.  These Care Teams include your primary Cardiologist (physician) and Advanced Practice Providers (APPs -  Physician  Assistants and Nurse Practitioners) who all work together to provide you with the care you need, when you need it.  We recommend signing up for the patient portal called "MyChart".  Sign up information is provided on this After Visit Summary.  MyChart is used to connect with patients for Virtual Visits (Telemedicine).  Patients are able to view lab/test results, encounter notes, upcoming appointments, etc.  Non-urgent messages can be sent to your provider as well.   To learn more about what you can do with MyChart, go to [email or web address].    Your next appointment:   3 month(s)  The format for your next appointment:   In Person  Provider:   Rudean Haskell, MD   Other Instructions We recommend that you wear compression stockings     Signed, Werner Lean, MD  12/27/2019 10:10 AM    [place]

## 2019-12-28 ENCOUNTER — Telehealth: Payer: Self-pay | Admitting: Internal Medicine

## 2019-12-28 NOTE — Telephone Encounter (Signed)
Spoke with patient regarding preferred weekdays and times for scheduling the Cardiac MRI ordered by Dr. Gasper Sells.  Informed patient as soon as we hear from the insurance company regarding the prior authorization--I will contact her with the appointment information.  She voiced her understanding.

## 2020-01-03 ENCOUNTER — Encounter: Payer: Self-pay | Admitting: Internal Medicine

## 2020-01-03 NOTE — Telephone Encounter (Signed)
Spoke with patient regarding Cardiac MRI scheduled Wednesday 02/08/20 at 11:00 am at [facility]----arrival time is 10:30 am--1st floor admissions office.  Will mail information to patient and she voiced her understanding.

## 2020-01-04 ENCOUNTER — Other Ambulatory Visit: Payer: Medicare HMO | Admitting: *Deleted

## 2020-01-04 ENCOUNTER — Other Ambulatory Visit: Payer: Self-pay

## 2020-01-04 DIAGNOSIS — I5030 Unspecified diastolic (congestive) heart failure: Secondary | ICD-10-CM | POA: Diagnosis not present

## 2020-01-05 LAB — BASIC METABOLIC PANEL
BUN/Creatinine Ratio: 13 (ref 12–28)
BUN: 14 mg/dL (ref 8–27)
CO2: 25 mmol/L (ref 20–29)
Calcium: 9.1 mg/dL (ref 8.7–10.3)
Chloride: 101 mmol/L (ref 96–106)
Creatinine, Ser: 1.04 mg/dL — ABNORMAL HIGH (ref 0.57–1.00)
GFR calc Af Amer: 62 mL/min/{1.73_m2} (ref >59)
GFR calc non Af Amer: 54 mL/min/{1.73_m2} — ABNORMAL LOW (ref >59)
Glucose: 115 mg/dL — ABNORMAL HIGH (ref 65–99)
Potassium: 3.9 mmol/L (ref 3.5–5.2)
Sodium: 142 mmol/L (ref 134–144)

## 2020-01-05 LAB — MAGNESIUM: Magnesium: 2.1 mg/dL (ref 1.6–2.3)

## 2020-02-07 ENCOUNTER — Telehealth: Payer: Self-pay | Admitting: Internal Medicine

## 2020-02-07 NOTE — Telephone Encounter (Signed)
Left message for patient to call and discuss rescheduling her Cardiac MRI---informed patient we need her new insurance information for the prior authorization.

## 2020-02-08 ENCOUNTER — Ambulatory Visit (HOSPITAL_COMMUNITY): Admission: RE | Admit: 2020-02-08 | Payer: Medicare HMO | Source: Ambulatory Visit

## 2020-02-16 ENCOUNTER — Encounter: Payer: Self-pay | Admitting: Internal Medicine

## 2020-02-16 ENCOUNTER — Telehealth: Payer: Self-pay | Admitting: Internal Medicine

## 2020-02-16 NOTE — Telephone Encounter (Signed)
Spoke with patient regarding new appointment date and time for the Cardiac MRI scheduled 03/21/20 at 11:00 am at [facility]---arrival time is 10:30 am --1st floor admissions office.  Will mail information to patient and she voiced her understanding.

## 2020-03-06 ENCOUNTER — Other Ambulatory Visit: Payer: Self-pay | Admitting: Obstetrics and Gynecology

## 2020-03-06 ENCOUNTER — Other Ambulatory Visit: Payer: Self-pay | Admitting: Family Medicine

## 2020-03-06 DIAGNOSIS — M48061 Spinal stenosis, lumbar region without neurogenic claudication: Secondary | ICD-10-CM | POA: Diagnosis not present

## 2020-03-06 DIAGNOSIS — Z1231 Encounter for screening mammogram for malignant neoplasm of breast: Secondary | ICD-10-CM

## 2020-03-06 DIAGNOSIS — M4316 Spondylolisthesis, lumbar region: Secondary | ICD-10-CM | POA: Diagnosis not present

## 2020-03-06 MED ORDER — LISINOPRIL 40 MG PO TABS
40.0000 mg | ORAL_TABLET | Freq: Every day | ORAL | 3 refills | Status: DC
Start: 2020-03-06 — End: 2021-02-26

## 2020-03-14 ENCOUNTER — Other Ambulatory Visit: Payer: Self-pay | Admitting: Family Medicine

## 2020-03-16 ENCOUNTER — Other Ambulatory Visit: Payer: Self-pay | Admitting: Family Medicine

## 2020-03-19 ENCOUNTER — Telehealth (HOSPITAL_COMMUNITY): Payer: Self-pay | Admitting: Emergency Medicine

## 2020-03-19 NOTE — Telephone Encounter (Signed)
Reaching out to patient to offer assistance regarding upcoming cardiac imaging study; pt verbalizes understanding of appt date/time, parking situation and where to check in, and verified current allergies; name and call back number provided for further questions should they arise Marchia Bond RN [facility] [phone] office [phone] cell  Denies implants, denies claustro Clarise Cruz

## 2020-03-21 ENCOUNTER — Ambulatory Visit (HOSPITAL_COMMUNITY)
Admission: RE | Admit: 2020-03-21 | Discharge: 2020-03-21 | Disposition: A | Discharge status: Home / Self Care | Payer: Medicare HMO | Source: Ambulatory Visit | Attending: Internal Medicine | Admitting: Internal Medicine

## 2020-03-21 ENCOUNTER — Other Ambulatory Visit: Payer: Self-pay

## 2020-03-21 DIAGNOSIS — I5030 Unspecified diastolic (congestive) heart failure: Secondary | ICD-10-CM

## 2020-03-21 MED ORDER — GADOBUTROL 1 MMOL/ML IV SOLN
10.0000 mL | Freq: Once | INTRAVENOUS | Status: AC | PRN
  Administered 2020-03-21: 10 mL via INTRAVENOUS

## 2020-03-23 ENCOUNTER — Encounter: Payer: Self-pay | Admitting: Internal Medicine

## 2020-03-23 ENCOUNTER — Ambulatory Visit: Payer: Medicare HMO | Admitting: Internal Medicine

## 2020-03-23 ENCOUNTER — Other Ambulatory Visit: Payer: Self-pay

## 2020-03-23 VITALS — BP 152/80 | HR 73 | Ht 64.0 in | Wt 263.9 lb

## 2020-03-23 DIAGNOSIS — R600 Localized edema: Secondary | ICD-10-CM

## 2020-03-23 DIAGNOSIS — I10 Essential (primary) hypertension: Secondary | ICD-10-CM

## 2020-03-23 DIAGNOSIS — E7800 Pure hypercholesterolemia, unspecified: Secondary | ICD-10-CM

## 2020-03-23 DIAGNOSIS — I5032 Chronic diastolic (congestive) heart failure: Secondary | ICD-10-CM

## 2020-03-23 MED ORDER — FUROSEMIDE 40 MG PO TABS: 40.0000 mg | ORAL_TABLET | Freq: Two times a day (BID) | ORAL | 3 refills | Status: DC

## 2020-03-23 NOTE — Patient Instructions (Signed)
Medication Instructions:  Your physician has recommended you make the following change in your medication:   INCREASE: furosemide (Lasix) to 40mg  by mouth twice daily *If you need a refill on your cardiac medications before your next appointment, please call your pharmacy*   Lab Work: 7-10 DAYS: BNP, BMP, FLP If you have labs (blood work) drawn today and your tests are completely normal, you will receive your results only by: Marland Kitchen MyChart Message (if you have MyChart) OR . A paper copy in the mail If you have any lab test that is abnormal or we need to change your treatment, we will call you to review the results.   Testing/Procedures: NONE   Follow-Up: At [facility], you and your health needs are our priority.  As part of our continuing mission to provide you with exceptional heart care, we have created designated Provider Care Teams.  These Care Teams include your primary Cardiologist (physician) and Advanced Practice Providers (APPs -  Physician Assistants and Nurse Practitioners) who all work together to provide you with the care you need, when you need it.  We recommend signing up for the patient portal called "MyChart".  Sign up information is provided on this After Visit Summary.  MyChart is used to connect with patients for Virtual Visits (Telemedicine).  Patients are able to view lab/test results, encounter notes, upcoming appointments, etc.  Non-urgent messages can be sent to your provider as well.   To learn more about what you can do with MyChart, go to [email or web address].    Your next appointment:   6 month(s)  The format for your next appointment:   In Person  Provider:   You may see Werner Lean, MD or one of the following Advanced Practice Providers on your designated Care Team:    Melina Copa, PA-C  Ermalinda Barrios, PA-C

## 2020-03-23 NOTE — Progress Notes (Signed)
Cardiology Office Note:    Date:  03/23/2020   ID:  [name], DOB July 07, 1945, MRN [id number]  PCP:  [name], MD  [facility] Cardiologist:  Werner Lean, MD  [place] Electrophysiologist:  None   CC: Follow up Leg swelling; HTN  History of Present Illness:    [name] is a 75 y.o. female with a hx of Morbid Obesity,  HTN, HLD presenting for evaluation 12/26/20.  In interim, had normal CMR.  Stopped aldactone because of rash- see 03/23/20.    Patient notes that she is doing good.  Since last visit notes rash with Spirnolactone (itchy legs).  Relevant interval testing or therapy include CMR.  There are no interval hospital/ED visit.    No chest pain or pressure. Lew swelling is a little bit better.  Does have some sharp pains in her legs.  Notes that with the Lasix 40 mg PO daily she still urinates some.  SOB and improved.    Ambulatory blood pressure log reviewed:  133/59.  Past Medical History:  Diagnosis Date  . Allergy   . Arthritis    arthritis -knees  . Colon polyps    benign- last colonoscopy 09-2008  . Depression   . GERD (gastroesophageal reflux disease)    hx GERD  . H/O seasonal allergies    no problems now  . Hyperlipidemia   . Hypertension   . Murmur, heart   . Spinal stenosis of lumbar region at multiple levels     Past Surgical History:  Procedure Laterality Date  . ABDOMINAL HYSTERECTOMY    . APPENDECTOMY     with gallbladder surgery  . BUNIONECTOMY Bilateral   . CHOLECYSTECTOMY     open-gallstones  . COLONOSCOPY    . EXCISION OF ADNEXAL MASS Left August 03, 2013   at [facility]/right adnexal nodule,excision;benigh lymph node  . OOPHORECTOMY Bilateral 2008   ovarian cyst  . POLYPECTOMY      Current Medications: Current Meds  Medication Sig  . albuterol (VENTOLIN HFA) 108 (90 Base) MCG/ACT inhaler Inhale 2 puffs into the lungs every 4 (four) hours as needed for wheezing or shortness of breath.  . Ascorbic Acid  (VITAMIN C GUMMIES PO) Take by mouth. Natures Made Vit C gummies  . brimonidine (ALPHAGAN) 0.2 % ophthalmic solution   . diltiazem (CARDIZEM CD) 360 MG 24 hr capsule Take 1 capsule by mouth once daily  . diphenhydrAMINE (BENADRYL) 25 mg capsule Take 25 mg by mouth every 6 (six) hours as needed.  . dorzolamide-timolol (COSOPT) 22.3-6.8 MG/ML ophthalmic solution INSTILL 1 DROP INTO RIGHT EYE TWICE A DAY  . doxazosin (CARDURA) 2 MG tablet Take 1 tablet by mouth once daily  . furosemide (LASIX) 40 MG tablet Take 1 tablet (40 mg total) by mouth 2 (two) times daily.  Marland Kitchen gabapentin (NEURONTIN) 300 MG capsule Take 1 capsule (300 mg total) by mouth 3 (three) times daily. For nerve pain  . Guaifenesin 1200 MG TB12 Take by mouth. Mucinex 1200 mg as needed  . HYDROcodone-acetaminophen (NORCO) 5-325 MG tablet Take 1 tablet by mouth every 6 (six) hours as needed for moderate pain.  Marland Kitchen lisinopril (ZESTRIL) 40 MG tablet Take 1 tablet (40 mg total) by mouth daily.  Marland Kitchen loratadine (CLARITIN) 10 MG tablet Take 10 mg by mouth daily.  . naproxen (NAPROSYN) 500 MG tablet TAKE 1 TABLET BY MOUTH TWICE DAILY AS NEEDED FOR MILD PAIN  . omeprazole (PRILOSEC) 40 MG capsule Take 1  capsule by mouth once daily  . Polyethylene Glycol 3350 (MIRALAX PO) Take by mouth once. 238 garms for colon prep with Dulcolax 5 mg tabs x 4 for colon 11-18  . prednisoLONE acetate (PRED FORTE) 1 % ophthalmic suspension INSTILL 1 DROP INTO AFFECTED EYE(S) 4 TIMES DAILY USE AS DIRECTED ON DROP SHEET  . tetrahydrozoline 0.05 % ophthalmic solution Place 1 drop into both eyes as needed (eye allergies).  . [DISCONTINUED] furosemide (LASIX) 40 MG tablet Take 1 tablet by mouth once daily   Current Facility-Administered Medications for the 03/23/20 encounter (Office Visit) with Werner Lean, MD  Medication  . 0.9 %  sodium chloride infusion     Allergies:   Aldactone [spironolactone]   Social History   Socioeconomic History  . Marital  status: Married    Spouse name: Not on file  . Number of children: Not on file  . Years of education: Not on file  . Highest education level: Not on file  Occupational History  . Not on file  Tobacco Use  . Smoking status: Never Smoker  . Smokeless tobacco: Never Used  Substance and Sexual Activity  . Alcohol use: No  . Drug use: No  . Sexual activity: Yes  Other Topics Concern  . Not on file  Social History Narrative  . Not on file   Social Determinants of Health   Financial Resource Strain: Not on file  Food Insecurity: Not on file  Transportation Needs: Not on file  Physical Activity: Not on file  Stress: Not on file  Social Connections: Not on file    Social: talked about hiding an egg for teething -kids   Family History: The patient's family history includes Breast cancer in her paternal aunt; Colon cancer (age of onset: 15) in her mother; Colon cancer (age of onset: 26) in her brother; Colon polyps in her brother; Heart attack in her father. There is no history of Esophageal cancer, Pancreatic cancer, Rectal cancer, or Stomach cancer. Father had MI and died at 32- no heart catheterization Son had MI and died at age 74- no heart catheterization Brother had CHF. No family history of hypertrophic cardiomyopathy. Brother- died in 23s.  ROS:   Please see the history of present illness.    All other systems reviewed and are negative.  EKGs/Labs/Other Studies Reviewed:    The following studies were reviewed today:  EKG:   12/26/20 sinus rhythm rate 79 no pathologic q waves  Transthoracic Echocardiogram: Date: 12/07/19 Results: Basal Septal Hypertrophy with Chordal SAM,Septal basal ration ~ 1.3 1. Left ventricular ejection fraction, by estimation, is 60 to 65%. The  left ventricle has normal function. The left ventricle has no regional  wall motion abnormalities. There is severe asymmetric left ventricular  hypertrophy of the basal and septal  segments. Left  ventricular diastolic parameters are consistent with Grade  I diastolic dysfunction (impaired relaxation).  2. Right ventricular systolic function is normal. The right ventricular  size is normal.  3. Left atrial size was mildly dilated.  4. The mitral valve is normal in structure. No evidence of mitral valve  regurgitation. No evidence of mitral stenosis.  5. The aortic valve is tricuspid. Aortic valve regurgitation is trivial.  Mild to moderate aortic valve sclerosis/calcification is present, without  any evidence of aortic stenosis.  6. The inferior vena cava is normal in size with greater than 50%  respiratory variability, suggesting right atrial pressure of 3 mmHg.   CMR: Date: 03/21/20 Results: IMPRESSION:  1. Normal left ventricular size, with asymmetric basal septal hypertrophy and normal systolic function (LVEF = 62%). There are no regional wall motion abnormalities and no late gadolinium enhancement in the left ventricular myocardium. ECV is 28%. The maximum thickness of the basal septum is 14 mm, there is no LGE, no LVOT gradient or SAM. These findings are not supportive of hypertrophic cardiomyopathy. 2. Normal right ventricular size, thickness and systolic function (RVEF = 62%). There are no regional wall motion abnormalities. 3. Mildly dilated left atrium, normal right atrial size. 4. Normal size of the aortic root, ascending aorta and pulmonary artery. 5.  No significant valvular abnormalities. 6.  Normal pericardium.  Trivial pericardial effusion.   Recent Labs: 11/07/2019: Brain Natriuretic Peptide 42 12/27/2019: ALT 17; Hemoglobin 13.3; Platelets 202; TSH 2.020 01/04/2020: BUN 14; Creatinine, Ser 1.04; Magnesium 2.1; Potassium 3.9; Sodium 142  Recent Lipid Panel    Component Value Date/Time   CHOL 186 09/26/2019 0827   TRIG 89 09/26/2019 0827   HDL 48 (L) 09/26/2019 0827   CHOLHDL 3.9 09/26/2019 0827   VLDL 20 10/29/2015 0902   LDLCALC 119 (H)  09/26/2019 0827    Risk Assessment/Calculations:    The 10-year ASCVD risk score Mikey Bussing DC Brooke Bonito., et al., 2013) is: 16.1%   Values used to calculate the score:     Age: 31 years     Sex: Female     Is Non-Hispanic African American: Yes     Diabetic: No     Tobacco smoker: No     Systolic Blood Pressure: 601 mmHg     Is BP treated: Yes     HDL Cholesterol: 48 mg/dL     Total Cholesterol: 186 mg/dL   Physical Exam:    VS:  BP (!) 152/80   Pulse 73   Ht 5\' 4"  (1.626 m)   Wt 263 lb 14.4 oz (119.7 kg)   SpO2 96%   BMI 45.30 kg/m     Wt Readings from Last 3 Encounters:  03/23/20 263 lb 14.4 oz (119.7 kg)  12/27/19 259 lb 9.6 oz (117.8 kg)  11/07/19 258 lb (117 kg)     GEN: Obese female, well developed in no acute distress HEENT: Normal NECK: No JVD; No carotid bruits LYMPHATICS: No lymphadenopathy CARDIAC: RRR, no murmurs, rubs, gallops RESPIRATORY:  Clear to auscultation without rales, wheezing or rhonchi  ABDOMEN: Soft, non-tender, non-distended MUSCULOSKELETAL:  +2 edema; No deformity  SKIN: Warm and dry; do note see lower extremity rash NEUROLOGIC:  Alert and oriented x 3 PSYCHIATRIC:  Normal affect   ASSESSMENT:    1. Chronic heart failure with preserved ejection fraction ([place])   2. Morbid obesity ([place])   3. Lower extremity edema   4. Essential (primary) hypertension   5. Pure hypercholesterolemia    PLAN:    In order of problems listed above:  HFpEF Morbid Obesity Lower extremity edema (bilateral)  - Re-discussed compression stockings - on no iatrogenic medications - will increase lasix to 40 mg PO BID; BMP and BNP - if no improvement by follow up visit, with no other cause, will consider send to vein/vascular specialist for evaluation   Essential Hypertension - ambulatory blood pressure near goal, will continue ambulatory BP monitoring; gave education on how to perform ambulatory blood pressure monitoring including the frequency and technique; goal  ambulatory blood pressure < 135/85 on average - continue home medications with the exception of will transition to 40 mg Po daily - lasix change as above -  discussed diet (DASH/low sodium), and exercise/weight loss interventions  Hyperlipidemia  -LDL goal less than 100 -Recheck lipid profile at next blood test - patient does not want to try Lipitor (husband had issues with it) - gave education on dietary changes  Six months follow up unless new symptoms or abnormal test results warranting change in plan  Would be reasonable for Virtual Follow up Would be reasonable for APP Follow up  Medication Adjustments/Labs and Tests Ordered: Current medicines are reviewed at length with the patient today.  Concerns regarding medicines are outlined above.  Orders Placed This Encounter  Procedures  . Basic metabolic panel  . Pro b natriuretic peptide (BNP)  . Lipid panel   Meds ordered this encounter  Medications  . furosemide (LASIX) 40 MG tablet    Sig: Take 1 tablet (40 mg total) by mouth 2 (two) times daily.    Dispense:  180 tablet    Refill:  3    Patient Instructions  Medication Instructions:  Your physician has recommended you make the following change in your medication:   INCREASE: furosemide (Lasix) to 40mg  by mouth twice daily *If you need a refill on your cardiac medications before your next appointment, please call your pharmacy*   Lab Work: 7-10 DAYS: BNP, BMP, FLP If you have labs (blood work) drawn today and your tests are completely normal, you will receive your results only by: Marland Kitchen MyChart Message (if you have MyChart) OR . A paper copy in the mail If you have any lab test that is abnormal or we need to change your treatment, we will call you to review the results.   Testing/Procedures: NONE   Follow-Up: At [facility], you and your health needs are our priority.  As part of our continuing mission to provide you with exceptional heart care, we have created  designated Provider Care Teams.  These Care Teams include your primary Cardiologist (physician) and Advanced Practice Providers (APPs -  Physician Assistants and Nurse Practitioners) who all work together to provide you with the care you need, when you need it.  We recommend signing up for the patient portal called "MyChart".  Sign up information is provided on this After Visit Summary.  MyChart is used to connect with patients for Virtual Visits (Telemedicine).  Patients are able to view lab/test results, encounter notes, upcoming appointments, etc.  Non-urgent messages can be sent to your provider as well.   To learn more about what you can do with MyChart, go to [email or web address].    Your next appointment:   6 month(s)  The format for your next appointment:   In Person  Provider:   You may see Werner Lean, MD or one of the following Advanced Practice Providers on your designated Care Team:    Melina Copa, PA-C  Ermalinda Barrios, PA-C          Signed, Werner Lean, MD  03/23/2020 8:50 AM    [place]

## 2020-03-26 DIAGNOSIS — G8929 Other chronic pain: Secondary | ICD-10-CM | POA: Diagnosis not present

## 2020-03-26 DIAGNOSIS — Z6841 Body Mass Index (BMI) 40.0 and over, adult: Secondary | ICD-10-CM | POA: Diagnosis not present

## 2020-03-26 DIAGNOSIS — K219 Gastro-esophageal reflux disease without esophagitis: Secondary | ICD-10-CM | POA: Diagnosis not present

## 2020-03-26 DIAGNOSIS — E785 Hyperlipidemia, unspecified: Secondary | ICD-10-CM | POA: Diagnosis not present

## 2020-03-26 DIAGNOSIS — M2550 Pain in unspecified joint: Secondary | ICD-10-CM | POA: Diagnosis not present

## 2020-03-26 DIAGNOSIS — I509 Heart failure, unspecified: Secondary | ICD-10-CM | POA: Diagnosis not present

## 2020-03-26 DIAGNOSIS — J45909 Unspecified asthma, uncomplicated: Secondary | ICD-10-CM | POA: Diagnosis not present

## 2020-03-26 DIAGNOSIS — I110 Hypertensive heart disease with heart failure: Secondary | ICD-10-CM | POA: Diagnosis not present

## 2020-03-26 DIAGNOSIS — R32 Unspecified urinary incontinence: Secondary | ICD-10-CM | POA: Diagnosis not present

## 2020-04-02 ENCOUNTER — Other Ambulatory Visit: Payer: Medicare HMO | Admitting: *Deleted

## 2020-04-02 ENCOUNTER — Other Ambulatory Visit: Payer: Self-pay

## 2020-04-02 DIAGNOSIS — E7800 Pure hypercholesterolemia, unspecified: Secondary | ICD-10-CM | POA: Diagnosis not present

## 2020-04-02 DIAGNOSIS — I5032 Chronic diastolic (congestive) heart failure: Secondary | ICD-10-CM

## 2020-04-03 ENCOUNTER — Telehealth: Payer: Self-pay

## 2020-04-03 DIAGNOSIS — E7800 Pure hypercholesterolemia, unspecified: Secondary | ICD-10-CM

## 2020-04-03 LAB — BASIC METABOLIC PANEL
BUN/Creatinine Ratio: 11 — ABNORMAL LOW (ref 12–28)
BUN: 11 mg/dL (ref 8–27)
CO2: 26 mmol/L (ref 20–29)
Calcium: 8.8 mg/dL (ref 8.7–10.3)
Chloride: 101 mmol/L (ref 96–106)
Creatinine, Ser: 1.04 mg/dL — ABNORMAL HIGH (ref 0.57–1.00)
Glucose: 100 mg/dL — ABNORMAL HIGH (ref 65–99)
Potassium: 3.5 mmol/L (ref 3.5–5.2)
Sodium: 142 mmol/L (ref 134–144)
eGFR: 57 mL/min/{1.73_m2} — ABNORMAL LOW (ref >59)

## 2020-04-03 LAB — LIPID PANEL
Chol/HDL Ratio: 4.1 ratio (ref 0.0–4.4)
Cholesterol, Total: 186 mg/dL (ref 100–199)
HDL: 45 mg/dL (ref >39)
LDL Chol Calc (NIH): 126 mg/dL — ABNORMAL HIGH (ref 0–99)
Triglycerides: 84 mg/dL (ref 0–149)
VLDL Cholesterol Cal: 15 mg/dL (ref 5–40)

## 2020-04-03 LAB — PRO B NATRIURETIC PEPTIDE: NT-Pro BNP: 60 pg/mL (ref 0–301)

## 2020-04-03 MED ORDER — ROSUVASTATIN CALCIUM 10 MG PO TABS: 10.0000 mg | ORAL_TABLET | Freq: Every day | ORAL | 3 refills | Status: DC

## 2020-04-03 NOTE — Telephone Encounter (Signed)
-----   Message from Werner Lean, MD sent at 04/03/2020  2:32 PM EST ----- Results: Stable creatinine  on new lasix with increased cholesterol Plan: Would off non-lipitor statin (rosuvastatin 10 mg) with lipds and lfts in three months; Patients husband had problems with lipitor in past  Werner Lean, MD

## 2020-04-16 ENCOUNTER — Other Ambulatory Visit: Payer: Self-pay | Admitting: Family Medicine

## 2020-04-23 ENCOUNTER — Other Ambulatory Visit: Payer: Self-pay

## 2020-04-23 DIAGNOSIS — E7800 Pure hypercholesterolemia, unspecified: Secondary | ICD-10-CM

## 2020-04-23 NOTE — Progress Notes (Signed)
Called patient to follow up she wasn't sure if she would take MD recommended rosuvastatin.  She states she has been taken medicine for 2 weeks.  I scheduled f/u FLP and LFT for 07/12/20.  Patient is aware to come in fasting for this appointment.

## 2020-04-25 ENCOUNTER — Other Ambulatory Visit: Payer: Self-pay

## 2020-04-25 ENCOUNTER — Ambulatory Visit
Admission: RE | Admit: 2020-04-25 | Discharge: 2020-04-25 | Disposition: A | Discharge status: Home / Self Care | Payer: Medicare HMO | Source: Ambulatory Visit | Attending: Obstetrics and Gynecology | Admitting: Obstetrics and Gynecology

## 2020-04-25 DIAGNOSIS — Z1231 Encounter for screening mammogram for malignant neoplasm of breast: Secondary | ICD-10-CM | POA: Diagnosis not present

## 2020-05-28 ENCOUNTER — Other Ambulatory Visit: Payer: Self-pay | Admitting: Family Medicine

## 2020-05-29 DIAGNOSIS — M48061 Spinal stenosis, lumbar region without neurogenic claudication: Secondary | ICD-10-CM | POA: Diagnosis not present

## 2020-05-29 DIAGNOSIS — M5416 Radiculopathy, lumbar region: Secondary | ICD-10-CM | POA: Diagnosis not present

## 2020-05-30 NOTE — Progress Notes (Signed)
Subjective:   [name] is a 75 y.o. female who presents for Medicare Annual (Subsequent) preventive examination.     I connected with [name]  today by telephone and verified that I am speaking with the correct person using two identifiers. Location patient: home Location provider: work Persons participating in the virtual visit: patient, provider.   I discussed the limitations, risks, security and privacy concerns of performing an evaluation and management service by telephone and the availability of in person appointments. I also discussed with the patient that there may be a patient responsible charge related to this service. The patient expressed understanding and verbally consented to this telephonic visit.    Interactive audio and video telecommunications were attempted between this provider and patient, however failed, due to patient having technical difficulties OR patient did not have access to video capability.  We continued and completed visit with audio only.       Review of Systems    N/A  Cardiac Risk Factors include: advanced age (>84men, >77 women);dyslipidemia;hypertension;obesity (BMI >30kg/m2)     Objective:    Today's Vitals   There is no height or weight on file to calculate BMI.  Advanced Directives 05/31/2020 12/05/2013 07/14/2013  Does Patient Have a Medical Advance Directive? No No Patient does not have advance directive;Patient would not like information  Pre-existing out of facility DNR order (yellow form or pink MOST form) - - No    Current Medications (verified) Outpatient Encounter Medications as of 05/31/2020  Medication Sig  . albuterol (VENTOLIN HFA) 108 (90 Base) MCG/ACT inhaler Inhale 2 puffs into the lungs every 4 (four) hours as needed for wheezing or shortness of breath.  . diltiazem (CARDIZEM CD) 360 MG 24 hr capsule Take 1 capsule by mouth once daily  . doxazosin (CARDURA) 2 MG tablet Take 1 tablet by mouth once daily  .  furosemide (LASIX) 40 MG tablet Take 1 tablet (40 mg total) by mouth 2 (two) times daily.  . Guaifenesin 1200 MG TB12 Take by mouth. Mucinex 1200 mg as needed  . lisinopril (ZESTRIL) 40 MG tablet Take 1 tablet (40 mg total) by mouth daily.  Marland Kitchen loratadine (CLARITIN) 10 MG tablet Take 10 mg by mouth daily.  . naproxen (NAPROSYN) 500 MG tablet TAKE 1 TABLET BY MOUTH TWICE DAILY AS NEEDED FOR MILD PAIN  . omeprazole (PRILOSEC) 40 MG capsule Take 1 capsule by mouth once daily  . Polyethylene Glycol 3350 (MIRALAX PO) Take by mouth once. 238 garms for colon prep with Dulcolax 5 mg tabs x 4 for colon 11-18  . rosuvastatin (CRESTOR) 10 MG tablet Take 1 tablet (10 mg total) by mouth daily.  Marland Kitchen tetrahydrozoline 0.05 % ophthalmic solution Place 1 drop into both eyes as needed (eye allergies).  . Ascorbic Acid (VITAMIN C GUMMIES PO) Take by mouth. Natures Made Vit C gummies (Patient not taking: Reported on 05/31/2020)  . brimonidine (ALPHAGAN) 0.2 % ophthalmic solution  (Patient not taking: Reported on 05/31/2020)  . diphenhydrAMINE (BENADRYL) 25 mg capsule Take 25 mg by mouth every 6 (six) hours as needed. (Patient not taking: Reported on 05/31/2020)  . dorzolamide-timolol (COSOPT) 22.3-6.8 MG/ML ophthalmic solution INSTILL 1 DROP INTO RIGHT EYE TWICE A DAY (Patient not taking: Reported on 05/31/2020)  . gabapentin (NEURONTIN) 300 MG capsule Take 1 capsule (300 mg total) by mouth 3 (three) times daily. For nerve pain (Patient not taking: Reported on 05/31/2020)  . [DISCONTINUED] HYDROcodone-acetaminophen (NORCO) 5-325 MG tablet Take 1 tablet by  mouth every 6 (six) hours as needed for moderate pain.  . [DISCONTINUED] prednisoLONE acetate (PRED FORTE) 1 % ophthalmic suspension INSTILL 1 DROP INTO AFFECTED EYE(S) 4 TIMES DAILY USE AS DIRECTED ON DROP SHEET   Facility-Administered Encounter Medications as of 05/31/2020  Medication  . 0.9 %  sodium chloride infusion    Allergies (verified) Aldactone [spironolactone]    History: Past Medical History:  Diagnosis Date  . Allergy   . Arthritis    arthritis -knees  . Colon polyps    benign- last colonoscopy 09-2008  . Depression   . GERD (gastroesophageal reflux disease)    hx GERD  . H/O seasonal allergies    no problems now  . Hyperlipidemia   . Hypertension   . Murmur, heart   . Spinal stenosis of lumbar region at multiple levels    Past Surgical History:  Procedure Laterality Date  . ABDOMINAL HYSTERECTOMY    . APPENDECTOMY     with gallbladder surgery  . BUNIONECTOMY Bilateral   . CHOLECYSTECTOMY     open-gallstones  . COLONOSCOPY    . EXCISION OF ADNEXAL MASS Left August 03, 2013   at [facility]/right adnexal nodule,excision;benigh lymph node  . OOPHORECTOMY Bilateral 2008   ovarian cyst  . POLYPECTOMY     Family History  Problem Relation Age of Onset  . Colon cancer Mother 91  . Heart attack Father   . Breast cancer Paternal Aunt   . Colon cancer Brother 19  . Colon polyps Brother   . Esophageal cancer Neg Hx   . Pancreatic cancer Neg Hx   . Rectal cancer Neg Hx   . Stomach cancer Neg Hx    Social History   Socioeconomic History  . Marital status: Married    Spouse name: Not on file  . Number of children: Not on file  . Years of education: Not on file  . Highest education level: Not on file  Occupational History  . Not on file  Tobacco Use  . Smoking status: Never Smoker  . Smokeless tobacco: Never Used  Substance and Sexual Activity  . Alcohol use: No  . Drug use: No  . Sexual activity: Yes  Other Topics Concern  . Not on file  Social History Narrative  . Not on file   Social Determinants of Health   Financial Resource Strain: Low Risk   . Difficulty of Paying Living Expenses: Not hard at all  Food Insecurity: No Food Insecurity  . Worried About Charity fundraiser in the Last Year: Never true  . Ran Out of Food in the Last Year: Never true  Transportation Needs: No Transportation Needs  . Lack of  Transportation (Medical): No  . Lack of Transportation (Non-Medical): No  Physical Activity: Inactive  . Days of Exercise per Week: 0 days  . Minutes of Exercise per Session: 0 min  Stress: No Stress Concern Present  . Feeling of Stress : Not at all  Social Connections: Moderately Isolated  . Frequency of Communication with Friends and Family: More than three times a week  . Frequency of Social Gatherings with Friends and Family: Three times a week  . Attends Religious Services: More than 4 times per year  . Active Member of Clubs or Organizations: No  . Attends Archivist Meetings: Never  . Marital Status: Widowed    Tobacco Counseling Counseling given: Not Answered   Clinical Intake:  Pre-visit preparation completed: Yes  Nutritional Risks: None Diabetes: No  How often do you need to have someone help you when you read instructions, pamphlets, or other written materials from your doctor or pharmacy?: 1 - Never  Diabetic? No  Interpreter Needed?: No  Information entered by :: [place] of Daily Living In your present state of health, do you have any difficulty performing the following activities: 05/31/2020  Hearing? N  Vision? Y  Comment has difficulty seeing even with glasses on at night  Difficulty concentrating or making decisions? Y  Comment Can not remember sometimes if she has taken a medication or not  Walking or climbing stairs? Y  Dressing or bathing? N  Doing errands, shopping? Y  Preparing Food and eating ? N  Using the Toilet? N  In the past six months, have you accidently leaked urine? Y  Comment has occassional urine leakage  Do you have problems with loss of bowel control? Y  Comment occassional stool leakage with back pain  Managing your Medications? N  Managing your Finances? N  Housekeeping or managing your Housekeeping? N  Some recent data might be hidden    Patient Care Team: [name], MD as PCP  - General (Family Medicine) Werner Lean, MD as PCP - Cardiology (Cardiology)  Indicate any recent Medical Services you may have received from other than [facility] providers in the past year (date may be approximate).     Assessment:   This is a routine wellness examination for Zafiro.  Hearing/Vision screen  Hearing Screening   125Hz  250Hz  500Hz  1000Hz  2000Hz  3000Hz  4000Hz  6000Hz  8000Hz   Right ear:           Left ear:           Vision Screening Comments: Patient states gets eye exams once per year. Currently wears glasses   Dietary issues and exercise activities discussed: Current Exercise Habits: The patient does not participate in regular exercise at present, Exercise limited by: orthopedic condition(s);psychological condition(s)  Goals Addressed            This Visit's Progress   . Exercise 3x per week (30 min per time)      . Have 3 meals a day      . Prevent falls      . Weight (lb) < 200 lb (90.7 kg)        Depression Screen PHQ 2/9 Scores 05/31/2020 05/04/2018 02/10/2017 11/03/2016 11/03/2016 10/29/2015 10/29/2015  PHQ - 2 Score 4 0 1 2 2  0 0  PHQ- 9 Score 10 - 3 - 6 - -    Fall Risk Fall Risk  05/31/2020 05/04/2018 11/03/2016 11/03/2016 10/29/2015  Falls in the past year? 0 0 No No No  Number falls in past yr: 0 - - - -  Injury with Fall? 0 - - - -  Risk for fall due to : Impaired balance/gait - - - -  Follow up Falls evaluation completed;Falls prevention discussed Falls evaluation completed - - -    FALL RISK PREVENTION PERTAINING TO THE HOME:  Any stairs in or around the home? No  If so, are there any without handrails? No  Home free of loose throw rugs in walkways, pet beds, electrical cords, etc? Yes  Adequate lighting in your home to reduce risk of falls? Yes   ASSISTIVE DEVICES UTILIZED TO PREVENT FALLS:  Life alert? No  Use of a cane, walker or w/c? Yes  Grab bars in the bathroom? Yes  Shower  chair or bench in shower? No  Elevated toilet seat or a  handicapped toilet? Yes   Cognitive Function:     6CIT Screen 05/31/2020  What Year? 0 points  What month? 0 points  What time? 0 points  Count back from 20 0 points  Months in reverse 0 points  Repeat phrase 0 points  Total Score 0    Immunizations Immunization History  Administered Date(s) Administered  . Fluad Quad(high Dose 65+) 10/06/2018, 11/07/2019  . Influenza, High Dose Seasonal PF 12/03/2017  . Influenza,inj,Quad PF,6+ Mos 11/06/2015, 11/03/2016  . PFIZER(Purple Top)SARS-COV-2 Vaccination 12/05/2019  . Pneumococcal Conjugate-13 10/20/2014  . Pneumococcal Polysaccharide-23 06/17/2012    TDAP status: Due, Education has been provided regarding the importance of this vaccine. Advised may receive this vaccine at local pharmacy or Health Dept. Aware to provide a copy of the vaccination record if obtained from local pharmacy or Health Dept. Verbalized acceptance and understanding.  Flu Vaccine status: Up to date  Pneumococcal vaccine status: Up to date  Covid-19 vaccine status: Completed vaccines  Qualifies for Shingles Vaccine? Yes   Zostavax completed No   Shingrix Completed?: No.    Education has been provided regarding the importance of this vaccine. Patient has been advised to call insurance company to determine out of pocket expense if they have not yet received this vaccine. Advised may also receive vaccine at local pharmacy or Health Dept. Verbalized acceptance and understanding.  Screening Tests Health Maintenance  Topic Date Due  . TETANUS/TDAP  Never done  . COVID-19 Vaccine (2 - Pfizer 3-dose series) 12/26/2019  . INFLUENZA VACCINE  08/27/2020  . MAMMOGRAM  04/25/2021  . COLONOSCOPY (Pts 45-72yrs Insurance coverage will need to be confirmed)  12/15/2023  . DEXA SCAN  Completed  . Hepatitis C Screening  Completed  . PNA vac Low Risk Adult  Completed  . HPV VACCINES  Aged Out    Health Maintenance  Health Maintenance Due  Topic Date Due  .  TETANUS/TDAP  Never done  . COVID-19 Vaccine (2 - Pfizer 3-dose series) 12/26/2019    Colorectal cancer screening: Type of screening: Colonoscopy. Completed 12/15/2018. Repeat every 5 years  Mammogram status: Completed 04/25/2020. Repeat every year  Bone Density status: Completed 09/01/2016. Results reflect: Bone density results: NORMAL. Repeat every 0 years.  Lung Cancer Screening: (Low Dose CT Chest recommended if Age 74-80 years, 30 pack-year currently smoking OR have quit w/in 15years.) does not qualify.   Lung Cancer Screening Referral: N/A   Additional Screening:  Hepatitis C Screening: does qualify; Completed 05/04/2018  Vision Screening: Recommended annual ophthalmology exams for early detection of glaucoma and other disorders of the eye. Is the patient up to date with their annual eye exam?  Yes  Who is the provider or what is the name of the office in which the patient attends annual eye exams? Dr. Herbert Deaner  If pt is not established with a provider, would they like to be referred to a provider to establish care? No .   Dental Screening: Recommended annual dental exams for proper oral hygiene  Community Resource Referral / Chronic Care Management: CRR required this visit?  No   CCM required this visit?  No      Plan:     I have personally reviewed and noted the following in the patient's chart:   . Medical and social history . Use of alcohol, tobacco or illicit drugs  . Current medications and supplements including opioid prescriptions.  . Functional  ability and status . Nutritional status . Physical activity . Advanced directives . List of other physicians . Hospitalizations, surgeries, and ER visits in previous 12 months . Vitals . Screenings to include cognitive, depression, and falls . Referrals and appointments  In addition, I have reviewed and discussed with patient certain preventive protocols, quality metrics, and best practice recommendations. A  written personalized care plan for preventive services as well as general preventive health recommendations were provided to patient.     Ofilia Neas, LPN   [id number]   Nurse Notes: Patient scored a 10 on PHQ9. Would like to be referred to a counselor

## 2020-05-31 ENCOUNTER — Other Ambulatory Visit: Payer: Self-pay

## 2020-05-31 ENCOUNTER — Ambulatory Visit (INDEPENDENT_AMBULATORY_CARE_PROVIDER_SITE_OTHER): Payer: Medicare HMO

## 2020-05-31 DIAGNOSIS — Z0000 Encounter for general adult medical examination without abnormal findings: Secondary | ICD-10-CM

## 2020-05-31 NOTE — Patient Instructions (Signed)
[name] , Thank you for taking time to come for your Medicare Wellness Visit. I appreciate your ongoing commitment to your health goals. Please review the following plan we discussed and let me know if I can assist you in the future.   Screening recommendations/referrals: Colonoscopy: Up to date, next due 12/15/2023 Mammogram: Up to date, next due 04/25/2021 Bone Density: No longer required  Recommended yearly ophthalmology/optometry visit for glaucoma screening and checkup Recommended yearly dental visit for hygiene and checkup  Vaccinations: Influenza vaccine: Up to date, next due fall 2022  Pneumococcal vaccine: Completed series  Tdap vaccine: Currently due, you may await and injury  Shingles vaccine: Currently due for Shingrix, if you would like to receive we recommend that you do so at your local pharmacy.    Advanced directives: Advance directive discussed with you today. Even though you declined this today please call our office should you change your mind and we can give you the proper paperwork for you to fill out.   Conditions/risks identified: None   Next appointment: 06/06/2021 @ 8:15 am via telephone with [place] 65 Years and Older, Female Preventive care refers to lifestyle choices and visits with your health care provider that can promote health and wellness. What does preventive care include?  A yearly physical exam. This is also called an annual well check.  Dental exams once or twice a year.  Routine eye exams. Ask your health care provider how often you should have your eyes checked.  Personal lifestyle choices, including:  Daily care of your teeth and gums.  Regular physical activity.  Eating a healthy diet.  Avoiding tobacco and drug use.  Limiting alcohol use.  Practicing safe sex.  Taking low-dose aspirin every day.  Taking vitamin and mineral supplements as recommended by your health care provider. What  happens during an annual well check? The services and screenings done by your health care provider during your annual well check will depend on your age, overall health, lifestyle risk factors, and family history of disease. Counseling  Your health care provider may ask you questions about your:  Alcohol use.  Tobacco use.  Drug use.  Emotional well-being.  Home and relationship well-being.  Sexual activity.  Eating habits.  History of falls.  Memory and ability to understand (cognition).  Work and work Statistician.  Reproductive health. Screening  You may have the following tests or measurements:  Height, weight, and BMI.  Blood pressure.  Lipid and cholesterol levels. These may be checked every 5 years, or more frequently if you are over 55 years old.  Skin check.  Lung cancer screening. You may have this screening every year starting at age 45 if you have a 30-pack-year history of smoking and currently smoke or have quit within the past 15 years.  Fecal occult blood test (FOBT) of the stool. You may have this test every year starting at age 62.  Flexible sigmoidoscopy or colonoscopy. You may have a sigmoidoscopy every 5 years or a colonoscopy every 10 years starting at age 78.  Hepatitis C blood test.  Hepatitis B blood test.  Sexually transmitted disease (STD) testing.  Diabetes screening. This is done by checking your blood sugar (glucose) after you have not eaten for a while (fasting). You may have this done every 1-3 years.  Bone density scan. This is done to screen for osteoporosis. You may have this done starting at age 59.  Mammogram. This may be  done every 1-2 years. Talk to your health care provider about how often you should have regular mammograms. Talk with your health care provider about your test results, treatment options, and if necessary, the need for more tests. Vaccines  Your health care provider may recommend certain vaccines, such  as:  Influenza vaccine. This is recommended every year.  Tetanus, diphtheria, and acellular pertussis (Tdap, Td) vaccine. You may need a Td booster every 10 years.  Zoster vaccine. You may need this after age 37.  Pneumococcal 13-valent conjugate (PCV13) vaccine. One dose is recommended after age 9.  Pneumococcal polysaccharide (PPSV23) vaccine. One dose is recommended after age 60. Talk to your health care provider about which screenings and vaccines you need and how often you need them. This information is not intended to replace advice given to you by your health care provider. Make sure you discuss any questions you have with your health care provider. Document Released: 02/09/2015 Document Revised: 10/03/2015 Document Reviewed: 11/14/2014 Elsevier Interactive Patient Education  2017 [place] Prevention in the Home Falls can cause injuries. They can happen to people of all ages. There are many things you can do to make your home safe and to help prevent falls. What can I do on the outside of my home?  Regularly fix the edges of walkways and driveways and fix any cracks.  Remove anything that might make you trip as you walk through a door, such as a raised step or threshold.  Trim any bushes or trees on the path to your home.  Use bright outdoor lighting.  Clear any walking paths of anything that might make someone trip, such as rocks or tools.  Regularly check to see if handrails are loose or broken. Make sure that both sides of any steps have handrails.  Any raised decks and porches should have guardrails on the edges.  Have any leaves, snow, or ice cleared regularly.  Use sand or salt on walking paths during winter.  Clean up any spills in your garage right away. This includes oil or grease spills. What can I do in the bathroom?  Use night lights.  Install grab bars by the toilet and in the tub and shower. Do not use towel bars as grab bars.  Use  non-skid mats or decals in the tub or shower.  If you need to sit down in the shower, use a plastic, non-slip stool.  Keep the floor dry. Clean up any water that spills on the floor as soon as it happens.  Remove soap buildup in the tub or shower regularly.  Attach bath mats securely with double-sided non-slip rug tape.  Do not have throw rugs and other things on the floor that can make you trip. What can I do in the bedroom?  Use night lights.  Make sure that you have a light by your bed that is easy to reach.  Do not use any sheets or blankets that are too big for your bed. They should not hang down onto the floor.  Have a firm chair that has side arms. You can use this for support while you get dressed.  Do not have throw rugs and other things on the floor that can make you trip. What can I do in the kitchen?  Clean up any spills right away.  Avoid walking on wet floors.  Keep items that you use a lot in easy-to-reach places.  If you need to reach something above you, use  a strong step stool that has a grab bar.  Keep electrical cords out of the way.  Do not use floor polish or wax that makes floors slippery. If you must use wax, use non-skid floor wax.  Do not have throw rugs and other things on the floor that can make you trip. What can I do with my stairs?  Do not leave any items on the stairs.  Make sure that there are handrails on both sides of the stairs and use them. Fix handrails that are broken or loose. Make sure that handrails are as long as the stairways.  Check any carpeting to make sure that it is firmly attached to the stairs. Fix any carpet that is loose or worn.  Avoid having throw rugs at the top or bottom of the stairs. If you do have throw rugs, attach them to the floor with carpet tape.  Make sure that you have a light switch at the top of the stairs and the bottom of the stairs. If you do not have them, ask someone to add them for you. What  else can I do to help prevent falls?  Wear shoes that:  Do not have high heels.  Have rubber bottoms.  Are comfortable and fit you well.  Are closed at the toe. Do not wear sandals.  If you use a stepladder:  Make sure that it is fully opened. Do not climb a closed stepladder.  Make sure that both sides of the stepladder are locked into place.  Ask someone to hold it for you, if possible.  Clearly mark and make sure that you can see:  Any grab bars or handrails.  First and last steps.  Where the edge of each step is.  Use tools that help you move around (mobility aids) if they are needed. These include:  Canes.  Walkers.  Scooters.  Crutches.  Turn on the lights when you go into a dark area. Replace any light bulbs as soon as they burn out.  Set up your furniture so you have a clear path. Avoid moving your furniture around.  If any of your floors are uneven, fix them.  If there are any pets around you, be aware of where they are.  Review your medicines with your doctor. Some medicines can make you feel dizzy. This can increase your chance of falling. Ask your doctor what other things that you can do to help prevent falls. This information is not intended to replace advice given to you by your health care provider. Make sure you discuss any questions you have with your health care provider. Document Released: 11/09/2008 Document Revised: 06/21/2015 Document Reviewed: 02/17/2014 Elsevier Interactive Patient Education  2017 [employer].

## 2020-06-06 ENCOUNTER — Telehealth: Payer: Self-pay | Admitting: Family Medicine

## 2020-06-06 NOTE — Progress Notes (Signed)
  Chronic Care Management   Note  06/06/2020 Name: KAMARAH BILOTTA MRN: [id number] DOB: [birth year]  THESSALY MCCULLERS is a 75 y.o. year old female who is a primary care patient of Dennard Schaumann, [name], MD. I reached out to Odie Sera by phone today in response to a referral sent by Ms. Lonn Georgia PCP, [name], MD.   Ms. Yano was given information about Chronic Care Management services today including:  1. CCM service includes personalized support from designated clinical staff supervised by her physician, including individualized plan of care and coordination with other care providers 2. 24/7 contact phone numbers for assistance for urgent and routine care needs. 3. Service will only be billed when office clinical staff spend 20 minutes or more in a month to coordinate care. 4. Only one practitioner may furnish and bill the service in a calendar month. 5. The patient may stop CCM services at any time (effective at the end of the month) by phone call to the office staff.   Patient agreed to services and verbal consent obtained.   Follow up plan:   Carley Perdue [employer] Scheduler

## 2020-07-05 ENCOUNTER — Telehealth: Payer: Self-pay | Admitting: Pharmacist

## 2020-07-05 NOTE — Progress Notes (Signed)
Chronic Care Management Pharmacy Note  07/10/2020 Name:  [name] MRN:  [id number] DOB:  [birth year]  Summary: Initial visit with PharmD.  Discussions on diet/exercise.  Recommendations/Changes made from today's visit: Recommended water aerobics to help with pain as well as increase her overall activity level.  Plan: F/u 4 months   Subjective: [name] is an 75 y.o. year old female who is a primary patient of Pickard, [name], MD.  The CCM team was consulted for assistance with disease management and care coordination needs.    Engaged with patient by telephone for initial visit in response to provider referral for pharmacy case management and/or care coordination services.   Consent to Services:  The patient was given the following information about Chronic Care Management services today, agreed to services, and gave verbal consent: 1. CCM service includes personalized support from designated clinical staff supervised by the primary care provider, including individualized plan of care and coordination with other care providers 2. 24/7 contact phone numbers for assistance for urgent and routine care needs. 3. Service will only be billed when office clinical staff spend 20 minutes or more in a month to coordinate care. 4. Only one practitioner may furnish and bill the service in a calendar month. 5.The patient may stop CCM services at any time (effective at the end of the month) by phone call to the office staff. 6. The patient will be responsible for cost sharing (co-pay) of up to 20% of the service fee (after annual deductible is met). Patient agreed to services and consent obtained.  Patient Care Team: [name], MD as PCP - General (Family Medicine) Werner Lean, MD as PCP - Cardiology (Cardiology) Edythe Clarity, [facility] as Pharmacist (Pharmacist)  Recent office visits:  05/31/20 Clinical Support Launa Grill, LPN. STOPPED/COMPLETED Hydrocodone and  Prednisolone.   Recent consult visits:  04/03/20 Telephone Cardiology STARTED Rosuvastatin 10 mg daily. 03/26/20 [facility] Per note: Home visit. No information given. 03/06/20 Neurosurgery Ashok Pall For spondylolisthesis. 03/23/20 Cardiology Werner Lean, MD. For Chronic heart failure. CHANGED Furosemide 40 mg to 2 times daily. STOPPED Spironolactone.    Objective:  Lab Results  Component Value Date   CREATININE 1.04 (H) 04/02/2020   BUN 11 04/02/2020   GFRNONAA 54 (L) 01/04/2020   GFRAA 62 01/04/2020   NA 142 04/02/2020   K 3.5 04/02/2020   CALCIUM 8.8 04/02/2020   CO2 26 04/02/2020   GLUCOSE 100 (H) 04/02/2020    No results found for: HGBA1C, FRUCTOSAMINE, GFR, MICROALBUR  Last diabetic Eye exam: No results found for: HMDIABEYEEXA  Last diabetic Foot exam: No results found for: HMDIABFOOTEX   Lab Results  Component Value Date   CHOL 186 04/02/2020   HDL 45 04/02/2020   LDLCALC 126 (H) 04/02/2020   TRIG 84 04/02/2020   CHOLHDL 4.1 04/02/2020    Hepatic Function Latest Ref Rng & Units 12/27/2019 09/26/2019 05/04/2018  Total Protein 6.0 - 8.5 g/dL 6.3 6.3 6.8  Albumin 3.7 - 4.7 g/dL 4.1 - -  AST 0 - 40 IU/L $Remov'9 14 15  'cZmBVI$ ALT 0 - 32 IU/L $Remov'17 12 12  'RGaHrC$ Alk Phosphatase 44 - 121 IU/L 58 - -  Total Bilirubin 0.0 - 1.2 mg/dL 0.3 0.6 0.4    Lab Results  Component Value Date/Time   TSH 2.020 12/27/2019 09:54 AM   TSH 1.88 10/29/2015 09:02 AM    CBC Latest Ref Rng & Units 12/27/2019  11/07/2019 09/26/2019  WBC 3.4 - 10.8 x10E3/uL 7.0 8.2 7.2  Hemoglobin 11.1 - 15.9 g/dL 13.3 14.3 13.5  Hematocrit 34.0 - 46.6 % 38.9 42.2 40.7  Platelets 150 - 450 x10E3/uL 202 267 227    No results found for: VD25OH  Clinical ASCVD: No  The ASCVD Risk score Mikey Bussing DC Jr., et al., 2013) failed to calculate for the following reasons:   The systolic blood pressure is missing    Depression screen [facility] 2/9 05/31/2020 05/04/2018 02/10/2017  Decreased Interest  3 0 1  Down, Depressed, Hopeless 1 0 0  PHQ - 2 Score 4 0 1  Altered sleeping 3 - 1  Tired, decreased energy 0 - 0  Change in appetite 3 - 0  Feeling bad or failure about yourself  0 - 0  Trouble concentrating 0 - 1  Moving slowly or fidgety/restless 0 - 0  Suicidal thoughts 0 - 0  PHQ-9 Score 10 - 3  Difficult doing work/chores Not difficult at all - Not difficult at all     Social History   Tobacco Use  Smoking Status Never  Smokeless Tobacco Never   BP Readings from Last 3 Encounters:  03/23/20 (!) 152/80  12/27/19 140/82  11/07/19 140/80   Pulse Readings from Last 3 Encounters:  03/23/20 73  12/27/19 79  11/07/19 97   Wt Readings from Last 3 Encounters:  03/23/20 263 lb 14.4 oz (119.7 kg)  12/27/19 259 lb 9.6 oz (117.8 kg)  11/07/19 258 lb (117 kg)   BMI Readings from Last 3 Encounters:  03/23/20 45.30 kg/m  12/27/19 44.56 kg/m  11/07/19 44.29 kg/m    Assessment/Interventions: Review of patient past medical history, allergies, medications, health status, including review of consultants reports, laboratory and other test data, was performed as part of comprehensive evaluation and provision of chronic care management services.   SDOH:  (Social Determinants of Health) assessments and interventions performed: Yes  Financial Resource Strain: Low Risk    Difficulty of Paying Living Expenses: Not hard at all    SDOH Screenings   Alcohol Screen: Low Risk    Last Alcohol Screening Score (AUDIT): 1  Depression (PHQ2-9): Medium Risk   PHQ-2 Score: 10  Financial Resource Strain: Low Risk    Difficulty of Paying Living Expenses: Not hard at all  Food Insecurity: No Food Insecurity   Worried About Charity fundraiser in the Last Year: Never true   Ran Out of Food in the Last Year: Never true  Housing: Low Risk    Last Housing Risk Score: 0  Physical Activity: Inactive   Days of Exercise per Week: 0 days   Minutes of Exercise per Session: 0 min  Social  Connections: Moderately Isolated   Frequency of Communication with Friends and Family: More than three times a week   Frequency of Social Gatherings with Friends and Family: Three times a week   Attends Religious Services: More than 4 times per year   Active Member of Clubs or Organizations: No   Attends Archivist Meetings: Never   Marital Status: Widowed  Stress: No Stress Concern Present   Feeling of Stress : Not at all  Tobacco Use: Low Risk    Smoking Tobacco Use: Never   Smokeless Tobacco Use: Never  Transportation Needs: No Transportation Needs   Lack of Transportation (Medical): No   Lack of Transportation (Non-Medical): No    CCM Care Plan  Allergies  Allergen Reactions   Aldactone [Spironolactone]  Rash    Medications Reviewed Today     Reviewed by Edythe Clarity, [facility] (Pharmacist) on 07/10/20 at 1557  Med List Status: <None>   Medication Order Taking? Sig Documenting Provider Last Dose Status Informant  0.9 %  sodium chloride infusion [id number]   Milus Banister, MD  Active   albuterol (VENTOLIN HFA) 108 (90 Base) MCG/ACT inhaler [id number] Yes Inhale 2 puffs into the lungs every 4 (four) hours as needed for wheezing or shortness of breath. [name], MD Taking Active   Ascorbic Acid (VITAMIN C GUMMIES PO) [id number] No Take by mouth. Natures Made Vit C gummies  Patient not taking: No sig reported   [provider] Not Taking Active      Patient not taking:   Discontinued 07/09/20 1546 (Discontinued by provider)   diltiazem (CARDIZEM CD) 360 MG 24 hr capsule [id number] Yes Take 1 capsule by mouth once daily [name], MD Taking Active   diphenhydrAMINE (BENADRYL) 25 mg capsule [id number] No Take 25 mg by mouth every 6 (six) hours as needed.  Patient not taking: No sig reported   [provider] Not Taking Active    Patient not taking:   Discontinued 07/09/20 1546 (Discontinued by provider)   doxazosin (CARDURA) 2 MG tablet  [id number] Yes Take 1 tablet by mouth once daily [name], MD Taking Active   furosemide (LASIX) 40 MG tablet [id number]  Take 1 tablet (40 mg total) by mouth 2 (two) times daily. Werner Lean, MD  Expired 06/21/20 2359   Patient not taking:  Discontinued 07/09/20 1550 (Patient Preference)   Guaifenesin 1200 MG TB12 [id number] Yes Take by mouth. Mucinex 1200 mg as needed [provider] Taking Active   lisinopril (ZESTRIL) 40 MG tablet [id number] Yes Take 1 tablet (40 mg total) by mouth daily. [name], MD Taking Active   loratadine (CLARITIN) 10 MG tablet [id number] Yes Take 10 mg by mouth daily. [provider] Taking Active   naproxen (NAPROSYN) 500 MG tablet [id number] Yes TAKE 1 TABLET BY MOUTH TWICE DAILY AS NEEDED FOR MILD PAIN [name], MD Taking Active   omeprazole (PRILOSEC) 40 MG capsule [id number] Yes Take 1 capsule by mouth once daily [name], MD Taking Active   Polyethylene Glycol 3350 (MIRALAX PO) [id number] Yes Take by mouth once. 238 garms for colon prep with Dulcolax 5 mg tabs x 4 for colon 11-18 [provider] Taking Active   rosuvastatin (CRESTOR) 10 MG tablet [id number] Yes Take 1 tablet (10 mg total) by mouth daily. Werner Lean, MD Taking Active   tetrahydrozoline 0.05 % ophthalmic solution [id number] No Place 1 drop into both eyes as needed (eye allergies).  Patient not taking: Reported on 07/09/2020   [provider] Not Taking Active Self            Patient Active Problem List   Diagnosis Date Noted   Morbid obesity ([place]) 03/23/2020   Heart failure with preserved ejection fraction ([place]) 12/27/2019   Lower extremity edema 12/27/2019   Spinal stenosis of lumbar region at multiple levels    Arthropathia 09/23/2013   Benign neoplasm of colon 09/23/2013   Essential (primary) hypertension 09/23/2013   SBO (small bowel obstruction) ([place]) 08/17/2013   Encounter for postoperative care  08/03/2013   Pelvic mass 07/05/2013   Pain in joint, pelvic region and thigh 07/05/2013   Hyperlipidemia    Arthritis    Colon polyps  Immunization History  Administered Date(s) Administered   Fluad Quad(high Dose 65+) 10/06/2018, 11/07/2019   Influenza, High Dose Seasonal PF 12/03/2017   Influenza,inj,Quad PF,6+ Mos 11/06/2015, 11/03/2016   PFIZER(Purple Top)SARS-COV-2 Vaccination 12/05/2019   Pneumococcal Conjugate-13 10/20/2014   Pneumococcal Polysaccharide-23 06/17/2012    Conditions to be addressed/monitored:  HTN, HFpEF, Arthritis, HLD  Care Plan : General Pharmacy (Adult)  Updates made by Edythe Clarity, RPH since 07/10/2020 12:00 AM     Problem: HTN, HFpEF, Arthritis, HLD   Priority: High  Onset Date: 07/09/2020     Long-Range Goal: Patient-Specific Goal   Start Date: 07/09/2020  Expected End Date: 01/09/2021  This Visit's Progress: On track  Priority: High  Note:   Current Barriers:  Unable to achieve control of cholesterol   Pharmacist Clinical Goal(s):  Patient will achieve adherence to monitoring guidelines and medication adherence to achieve therapeutic efficacy achieve control of cholesterol as evidenced by lipid panel contact provider office for questions/concerns as evidenced notation of same in electronic health record through collaboration with PharmD and provider.   Interventions: 1:1 collaboration with [name], MD regarding development and update of comprehensive plan of care as evidenced by provider attestation and co-signature Inter-disciplinary care team collaboration (see longitudinal plan of care) Comprehensive medication review performed; medication list updated in electronic medical record  Hypertension (BP goal <140/90) -Controlled -Current treatment: Lisinopril 40mg  daily Diltiazem CD 360mg  24hr daily Doxazosin 2mg  daily -Medications previously tried: HCTZ  -Current home readings: 127/80  -Current exercise habits:  minimal -Denies hypotensive/hypertensive symptoms -Educated on BP goals and benefits of medications for prevention of heart attack, stroke and kidney damage; Exercise goal of 150 minutes per week; Importance of home blood pressure monitoring; Symptoms of hypotension and importance of maintaining adequate hydration; -Counseled to monitor BP at home a few times per week, document, and provide log at future appointments -Counseled on diet and exercise extensively Recommended to continue current medication Recommended she participate in water aerobics or water exercise of some kind using silver sneakers or at the Aquatic center.  Hyperlipidemia: (LDL goal < 100) -Uncontrolled -Current treatment: Rosuvastatin 10mg  daily -Medications previously tried: none noted   -Educated on Cholesterol goals;  Benefits of statin for ASCVD risk reduction; Importance of limiting foods high in cholesterol; Strategies to manage statin-induced myalgias; -Started statin after most recent lipid panel -She is tolerating it well Has appointment on the 16th to recheck lipids -Counseled on diet and exercise extensively Recommended to continue current medication Will follow up on lipids next month to check efficacy.  Heart Failure (Goal: manage symptoms and prevent exacerbations) -Controlled -Last ejection fraction: 60-65% -Current treatment: Furosemide 40mg  daily -Medications previously tried: none noted  -Current home BP/HR readings: 128/70  -Educated on Benefits of medications for managing symptoms and prolonging life Importance of weighing daily; if you gain more than 3 pounds in one day or 5 pounds in one week, contact providers Importance of blood pressure control Denies any recent swelling or SOB -Counseled on diet and exercise extensively Recommended to continue current medication  Arthritis (Goal: Minimize symptoms) -Not ideally controlled -Current treatment  Naproxen 500mg  prn -Medications  previously tried: none noted -Still having some pain  -Counseled on diet and exercise extensively Recommended to continue current medication Recommended water exercise at aquatic center, patient interested to increase her activity level.  Patient Goals/Self-Care Activities Patient will:  - take medications as prescribed focus on medication adherence by fill dates check blood pressure a few times per week, document,  and provide at future appointments Work to get physical activity at somewhere such as the aquatic center.  Follow Up Plan: The care management team will reach out to the patient again over the next 120 days.        Medication Assistance: None required.  Patient affirms current coverage meets needs.  Compliance/Adherence/Medication fill history: Care Gaps: Shingrix    Patient's preferred pharmacy is:  [place] ([place]), [place] - [address] [place] ([place]) [place] [address] Phone: [phone] Fax: [phone]   Pt endorses 100% compliance  We discussed: Benefits of medication synchronization, packaging and delivery as well as enhanced pharmacist oversight with [employer]. Patient decided to: Continue current medication management strategy  Care Plan and Follow Up Patient Decision:  Patient agrees to Care Plan and Follow-up.  Plan: The care management team will reach out to the patient again over the next 120 days.  Beverly Milch, PharmD Clinical Pharmacist [place] [phone]

## 2020-07-05 NOTE — Progress Notes (Addendum)
Chronic Care Management Pharmacy Assistant   Name: ILONA COLLEY  MRN: [id number] DOB: 1945-04-14  [name] is an 75 y.o. year old female who presents for his initial CCM visit with the clinical pharmacist.  Reason for Encounter: Chart Prep   Conditions to be addressed/monitored: HTN, Heart Failure, HLD.  Primary concerns for visit include: HTN  Recent office visits:  05/31/20 Clinical Support Launa Grill, LPN. STOPPED/COMPLETED Hydrocodone and Prednisolone.   Recent consult visits:  04/03/20 Telephone Cardiology STARTED Rosuvastatin 10 mg daily.  03/26/20 [facility] Per note: Home visit. No information given. 03/06/20 Neurosurgery Ashok Pall For spondylolisthesis.  03/23/20 Cardiology Werner Lean, MD. For Chronic heart failure. CHANGED Furosemide 40 mg to 2 times daily. STOPPED Spironolactone.   Hospital visits:  None in previous 6 months  Medications: Outpatient Encounter Medications as of 07/05/2020  Medication Sig   albuterol (VENTOLIN HFA) 108 (90 Base) MCG/ACT inhaler Inhale 2 puffs into the lungs every 4 (four) hours as needed for wheezing or shortness of breath.   Ascorbic Acid (VITAMIN C GUMMIES PO) Take by mouth. Natures Made Vit C gummies (Patient not taking: Reported on 05/31/2020)   brimonidine (ALPHAGAN) 0.2 % ophthalmic solution  (Patient not taking: Reported on 05/31/2020)   diltiazem (CARDIZEM CD) 360 MG 24 hr capsule Take 1 capsule by mouth once daily   diphenhydrAMINE (BENADRYL) 25 mg capsule Take 25 mg by mouth every 6 (six) hours as needed. (Patient not taking: Reported on 05/31/2020)   dorzolamide-timolol (COSOPT) 22.3-6.8 MG/ML ophthalmic solution INSTILL 1 DROP INTO RIGHT EYE TWICE A DAY (Patient not taking: Reported on 05/31/2020)   doxazosin (CARDURA) 2 MG tablet Take 1 tablet by mouth once daily   furosemide (LASIX) 40 MG tablet Take 1 tablet (40 mg total) by mouth 2 (two) times daily.   gabapentin  (NEURONTIN) 300 MG capsule Take 1 capsule (300 mg total) by mouth 3 (three) times daily. For nerve pain (Patient not taking: Reported on 05/31/2020)   Guaifenesin 1200 MG TB12 Take by mouth. Mucinex 1200 mg as needed   lisinopril (ZESTRIL) 40 MG tablet Take 1 tablet (40 mg total) by mouth daily.   loratadine (CLARITIN) 10 MG tablet Take 10 mg by mouth daily.   naproxen (NAPROSYN) 500 MG tablet TAKE 1 TABLET BY MOUTH TWICE DAILY AS NEEDED FOR MILD PAIN   omeprazole (PRILOSEC) 40 MG capsule Take 1 capsule by mouth once daily   Polyethylene Glycol 3350 (MIRALAX PO) Take by mouth once. 238 garms for colon prep with Dulcolax 5 mg tabs x 4 for colon 11-18   rosuvastatin (CRESTOR) 10 MG tablet Take 1 tablet (10 mg total) by mouth daily.   tetrahydrozoline 0.05 % ophthalmic solution Place 1 drop into both eyes as needed (eye allergies).   Facility-Administered Encounter Medications as of 07/05/2020  Medication   0.9 %  sodium chloride infusion   Have you seen any other providers since your last visit? Patient stated no.  Any changes in your medications or health? Patient stated no.  Any side effects from any medications? Patient stated no.  Do you have an symptoms or problems not managed by your medications? Patient stated no.  Any concerns about your health right now? Patient stated no.  Has your provider asked that you check blood pressure, blood sugar, or follow special diet at home? Patient stated she monitors her blood pressure.  Do you get any type of exercise on a regular  basis? Patient stated no.  Can you think of a goal you would like to reach for your health? Patient stated she would like to improve her movement/activity levels but she is in pain.  Do you have any problems getting your medications? Patient stated no.  Is there anything that you would like to discuss during the appointment? Patient stated no.  Please bring medications and supplements to appointment, patient reminded of  her OTP on 07/09/20 on 330 pm.  Follow-Up:Pharmacist Review  Charlann Lange, [place] Pharmacist Assistant [phone]

## 2020-07-09 ENCOUNTER — Ambulatory Visit (INDEPENDENT_AMBULATORY_CARE_PROVIDER_SITE_OTHER): Payer: Medicare HMO | Admitting: Pharmacist

## 2020-07-09 DIAGNOSIS — I5032 Chronic diastolic (congestive) heart failure: Secondary | ICD-10-CM | POA: Diagnosis not present

## 2020-07-09 DIAGNOSIS — M1990 Unspecified osteoarthritis, unspecified site: Secondary | ICD-10-CM

## 2020-07-09 DIAGNOSIS — E7800 Pure hypercholesterolemia, unspecified: Secondary | ICD-10-CM

## 2020-07-09 DIAGNOSIS — I10 Essential (primary) hypertension: Secondary | ICD-10-CM | POA: Diagnosis not present

## 2020-07-10 NOTE — Patient Instructions (Addendum)
Visit Information   Goals Addressed             This Visit's Progress    Track and Manage My Blood Pressure-Hypertension       Timeframe:  Long-Range Goal Priority:  High Start Date:   07/10/20                          Expected End Date: 01/09/21                      Follow Up Date 10/26/20    - check blood pressure 3 times per week - choose a place to take my blood pressure (home, clinic or office, retail store) - write blood pressure results in a log or diary    Why is this important?   You won't feel high blood pressure, but it can still hurt your blood vessels.  High blood pressure can cause heart or kidney problems. It can also cause a stroke.  Making lifestyle changes like losing a little weight or eating less salt will help.  Checking your blood pressure at home and at different times of the day can help to control blood pressure.  If the doctor prescribes medicine remember to take it the way the doctor ordered.  Call the office if you cannot afford the medicine or if there are questions about it.     Notes:         Patient Care Plan: General Pharmacy (Adult)     Problem Identified: HTN, HFpEF, Arthritis, HLD   Priority: High  Onset Date: 07/09/2020     Long-Range Goal: Patient-Specific Goal   Start Date: 07/09/2020  Expected End Date: 01/09/2021  This Visit's Progress: On track  Priority: High  Note:   Current Barriers:  Unable to achieve control of cholesterol   Pharmacist Clinical Goal(s):  Patient will achieve adherence to monitoring guidelines and medication adherence to achieve therapeutic efficacy achieve control of cholesterol as evidenced by lipid panel contact provider office for questions/concerns as evidenced notation of same in electronic health record through collaboration with PharmD and provider.   Interventions: 1:1 collaboration with [name], MD regarding development and update of comprehensive plan of care as evidenced by  provider attestation and co-signature Inter-disciplinary care team collaboration (see longitudinal plan of care) Comprehensive medication review performed; medication list updated in electronic medical record  Hypertension (BP goal <140/90) -Controlled -Current treatment: Lisinopril 40mg  daily Diltiazem CD 360mg  24hr daily Doxazosin 2mg  daily -Medications previously tried: HCTZ  -Current home readings: 127/80  -Current exercise habits: minimal -Denies hypotensive/hypertensive symptoms -Educated on BP goals and benefits of medications for prevention of heart attack, stroke and kidney damage; Exercise goal of 150 minutes per week; Importance of home blood pressure monitoring; Symptoms of hypotension and importance of maintaining adequate hydration; -Counseled to monitor BP at home a few times per week, document, and provide log at future appointments -Counseled on diet and exercise extensively Recommended to continue current medication Recommended she participate in water aerobics or water exercise of some kind using silver sneakers or at the Aquatic center.  Hyperlipidemia: (LDL goal < 100) -Uncontrolled -Current treatment: Rosuvastatin 10mg  daily -Medications previously tried: none noted   -Educated on Cholesterol goals;  Benefits of statin for ASCVD risk reduction; Importance of limiting foods high in cholesterol; Strategies to manage statin-induced myalgias; -Started statin after most recent lipid panel -She is tolerating it well Has appointment on the  16th to recheck lipids -Counseled on diet and exercise extensively Recommended to continue current medication Will follow up on lipids next month to check efficacy.  Heart Failure (Goal: manage symptoms and prevent exacerbations) -Controlled -Last ejection fraction: 60-65% -Current treatment: Furosemide 40mg  daily -Medications previously tried: none noted  -Current home BP/HR readings: 128/70  -Educated on Benefits of  medications for managing symptoms and prolonging life Importance of weighing daily; if you gain more than 3 pounds in one day or 5 pounds in one week, contact providers Importance of blood pressure control Denies any recent swelling or SOB -Counseled on diet and exercise extensively Recommended to continue current medication  Arthritis (Goal: Minimize symptoms) -Not ideally controlled -Current treatment  Naproxen 500mg  prn -Medications previously tried: none noted -Still having some pain  -Counseled on diet and exercise extensively Recommended to continue current medication Recommended water exercise at aquatic center, patient interested to increase her activity level.  Patient Goals/Self-Care Activities Patient will:  - take medications as prescribed focus on medication adherence by fill dates check blood pressure a few times per week, document, and provide at future appointments Work to get physical activity at somewhere such as the aquatic center.  Follow Up Plan: The care management team will reach out to the patient again over the next 120 days.       Ms. Luera was given information about Chronic Care Management services today including:  CCM service includes personalized support from designated clinical staff supervised by her physician, including individualized plan of care and coordination with other care providers 24/7 contact phone numbers for assistance for urgent and routine care needs. Standard insurance, coinsurance, copays and deductibles apply for chronic care management only during months in which we provide at least 20 minutes of these services. Most insurances cover these services at 100%, however patients may be responsible for any copay, coinsurance and/or deductible if applicable. This service may help you avoid the need for more expensive face-to-face services. Only one practitioner may furnish and bill the service in a calendar month. The patient may stop CCM  services at any time (effective at the end of the month) by phone call to the office staff.  Patient agreed to services and verbal consent obtained.   The patient verbalized understanding of instructions, educational materials, and care plan provided today and agreed to receive a mailed copy of patient instructions, educational materials, and care plan.  Telephone follow up appointment with pharmacy team member scheduled for:4 months  Edythe Clarity, [facility]

## 2020-07-12 ENCOUNTER — Other Ambulatory Visit: Payer: Medicare HMO | Admitting: *Deleted

## 2020-07-12 ENCOUNTER — Other Ambulatory Visit: Payer: Self-pay

## 2020-07-12 DIAGNOSIS — E7800 Pure hypercholesterolemia, unspecified: Secondary | ICD-10-CM | POA: Diagnosis not present

## 2020-07-12 LAB — LIPID PANEL
Chol/HDL Ratio: 2.8 ratio (ref 0.0–4.4)
Cholesterol, Total: 128 mg/dL (ref 100–199)
HDL: 45 mg/dL (ref >39)
LDL Chol Calc (NIH): 64 mg/dL (ref 0–99)
Triglycerides: 99 mg/dL (ref 0–149)
VLDL Cholesterol Cal: 19 mg/dL (ref 5–40)

## 2020-07-12 LAB — HEPATIC FUNCTION PANEL
ALT: 10 IU/L (ref 0–32)
AST: 17 IU/L (ref 0–40)
Albumin: 4.5 g/dL (ref 3.7–4.7)
Alkaline Phosphatase: 57 IU/L (ref 44–121)
Bilirubin Total: 0.4 mg/dL (ref 0.0–1.2)
Bilirubin, Direct: 0.13 mg/dL (ref 0.00–0.40)
Total Protein: 6.5 g/dL (ref 6.0–8.5)

## 2020-07-24 DIAGNOSIS — M5416 Radiculopathy, lumbar region: Secondary | ICD-10-CM | POA: Diagnosis not present

## 2020-08-10 ENCOUNTER — Encounter: Payer: Self-pay | Admitting: Nurse Practitioner

## 2020-08-10 ENCOUNTER — Ambulatory Visit (INDEPENDENT_AMBULATORY_CARE_PROVIDER_SITE_OTHER): Payer: Medicare HMO | Admitting: Nurse Practitioner

## 2020-08-10 ENCOUNTER — Other Ambulatory Visit: Payer: Self-pay

## 2020-08-10 VITALS — BP 138/78 | HR 74 | Temp 98.2°F | Ht 64.0 in | Wt 257.2 lb

## 2020-08-10 DIAGNOSIS — H938X1 Other specified disorders of right ear: Secondary | ICD-10-CM | POA: Diagnosis not present

## 2020-08-10 DIAGNOSIS — J3489 Other specified disorders of nose and nasal sinuses: Secondary | ICD-10-CM | POA: Diagnosis not present

## 2020-08-10 MED ORDER — FLUTICASONE PROPIONATE 50 MCG/ACT NA SUSP: 2.0000 | Freq: Every day | NASAL | 6 refills | Status: DC

## 2020-08-10 MED ORDER — AMOXICILLIN-POT CLAVULANATE 875-125 MG PO TABS: 1.0000 | ORAL_TABLET | Freq: Two times a day (BID) | ORAL | 0 refills | Status: AC

## 2020-08-10 NOTE — Progress Notes (Signed)
Subjective:    Patient ID: [name], female    DOB: 19-Oct-1945, 75 y.o.   MRN: [id number]  HPI: [name] is a 75 y.o. female presenting for  Chief Complaint  Patient presents with   Ear Fullness    Ear fullness for 2 wks, used earwax removal to unclogged, has some eye drainage and feeling off balance   UPPER RESPIRATORY TRACT INFECTION Onset: COVID-19 testing history: COVID-19 vaccination status: Fever: no Cough: no Shortness of breath: no Wheezing: no Chest pain: yes, with cough Chest tightness: no Chest congestion: yes Nasal congestion: yes Runny nose: yes Post nasal drip: yes Sneezing: no Sore throat: no Swollen glands: no Sinus pressure: yes; right side only Headache: no Face pain: no Toothache: no Ear pain:  yes; right side only   Ear pressure: yes; right side only  Eyes red/itching: no Eye drainage/crusting: no  Nausea: no  Vomiting: no Diarrhea: no  Change in appetite: no  Loss of taste/smell: no  Rash: no Fatigue: no Sick contacts: no Strep contacts: no  Context: stable Recurrent sinusitis: no Treatments attempted: allergy medication, ear clog drops OTC  Relief with OTC medications: no  Allergies  Allergen Reactions   Aldactone [Spironolactone] Rash    Outpatient Encounter Medications as of 08/10/2020  Medication Sig   albuterol (VENTOLIN HFA) 108 (90 Base) MCG/ACT inhaler Inhale 2 puffs into the lungs every 4 (four) hours as needed for wheezing or shortness of breath.   amoxicillin-clavulanate (AUGMENTIN) 875-125 MG tablet Take 1 tablet by mouth 2 (two) times daily for 7 days.   Ascorbic Acid (VITAMIN C GUMMIES PO) Take by mouth. Natures Made Vit C gummies   diltiazem (CARDIZEM CD) 360 MG 24 hr capsule Take 1 capsule by mouth once daily   diphenhydrAMINE (BENADRYL) 25 mg capsule Take 25 mg by mouth every 6 (six) hours as needed.   doxazosin (CARDURA) 2 MG tablet Take 1 tablet by mouth once daily   fluticasone (FLONASE) 50 MCG/ACT  nasal spray Place 2 sprays into both nostrils daily.   Guaifenesin 1200 MG TB12 Take by mouth. Mucinex 1200 mg as needed   lisinopril (ZESTRIL) 40 MG tablet Take 1 tablet (40 mg total) by mouth daily.   loratadine (CLARITIN) 10 MG tablet Take 10 mg by mouth daily.   naproxen (NAPROSYN) 500 MG tablet TAKE 1 TABLET BY MOUTH TWICE DAILY AS NEEDED FOR MILD PAIN   omeprazole (PRILOSEC) 40 MG capsule Take 1 capsule by mouth once daily   Polyethylene Glycol 3350 (MIRALAX PO) Take by mouth once. 238 garms for colon prep with Dulcolax 5 mg tabs x 4 for colon 11-18   rosuvastatin (CRESTOR) 10 MG tablet Take 1 tablet (10 mg total) by mouth daily.   tetrahydrozoline 0.05 % ophthalmic solution Place 1 drop into both eyes as needed (eye allergies).   furosemide (LASIX) 40 MG tablet Take 1 tablet (40 mg total) by mouth 2 (two) times daily.   Facility-Administered Encounter Medications as of 08/10/2020  Medication   0.9 %  sodium chloride infusion    Patient Active Problem List   Diagnosis Date Noted   Morbid obesity ([place]) 03/23/2020   Heart failure with preserved ejection fraction ([place]) 12/27/2019   Lower extremity edema 12/27/2019   Spinal stenosis of lumbar region at multiple levels    Arthropathia 09/23/2013   Benign neoplasm of colon 09/23/2013   Essential (primary) hypertension 09/23/2013   SBO (small bowel obstruction) ([place]) 08/17/2013   Encounter for postoperative  care 08/03/2013   Pelvic mass 07/05/2013   Pain in joint, pelvic region and thigh 07/05/2013   Hyperlipidemia    Arthritis    Colon polyps     Past Medical History:  Diagnosis Date   Allergy    Arthritis    arthritis -knees   Colon polyps    benign- last colonoscopy 09-2008   Depression    GERD (gastroesophageal reflux disease)    hx GERD   H/O seasonal allergies    no problems now   Hyperlipidemia    Hypertension    Murmur, heart    Spinal stenosis of lumbar region at multiple levels     Relevant past medical,  surgical, family and social history reviewed and updated as indicated. Interim medical history since our last visit reviewed.  Review of Systems Per HPI unless specifically indicated above     Objective:    BP 138/78   Pulse 74   Temp 98.2 F (36.8 C)   Ht 5\' 4"  (1.626 m)   Wt 257 lb 3.2 oz (116.7 kg)   SpO2 98%   BMI 44.15 kg/m   Wt Readings from Last 3 Encounters:  08/10/20 257 lb 3.2 oz (116.7 kg)  03/23/20 263 lb 14.4 oz (119.7 kg)  12/27/19 259 lb 9.6 oz (117.8 kg)    Physical Exam Vitals and nursing note reviewed.  Constitutional:      General: She is not in acute distress.    Appearance: Normal appearance. She is obese. She is not toxic-appearing.  HENT:     Head: Normocephalic and atraumatic.     Right Ear: Tympanic membrane, ear canal and external ear normal.     Left Ear: Tympanic membrane, ear canal and external ear normal.     Nose:     Right Turbinates: Swollen.     Left Turbinates: Swollen.     Right Sinus: Maxillary sinus tenderness present. No frontal sinus tenderness.     Left Sinus: No maxillary sinus tenderness or frontal sinus tenderness.     Mouth/Throat:     Mouth: Mucous membranes are moist.     Pharynx: Oropharynx is clear. No oropharyngeal exudate or posterior oropharyngeal erythema.  Eyes:     General: No scleral icterus.    Extraocular Movements: Extraocular movements intact.     Pupils: Pupils are equal, round, and reactive to light.  Cardiovascular:     Rate and Rhythm: Normal rate and regular rhythm.     Heart sounds: Murmur heard.  Pulmonary:     Effort: Pulmonary effort is normal. No respiratory distress.     Breath sounds: Normal breath sounds. No wheezing, rhonchi or rales.  Musculoskeletal:     Cervical back: Normal range of motion.     Right lower leg: No edema.     Left lower leg: No edema.  Lymphadenopathy:     Cervical: No cervical adenopathy.  Skin:    General: Skin is warm and dry.     Capillary Refill: Capillary refill  takes less than 2 seconds.     Coloration: Skin is not jaundiced or pale.     Findings: No erythema.  Neurological:     Mental Status: She is alert and oriented to person, place, and time.     Motor: No weakness.     Gait: Gait normal.  Psychiatric:        Mood and Affect: Mood normal.        Behavior: Behavior normal.  Thought Content: Thought content normal.        Judgment: Judgment normal.      Assessment & Plan:  1. Sinus pressure Acute, ongoing x 7 days.  Discussed use of flonase for 2 days and with no benefit, can start Augmentin for presumed bacterial sinus infection.  Follow up if symptoms do not improve.  - amoxicillin-clavulanate (AUGMENTIN) 875-125 MG tablet; Take 1 tablet by mouth 2 (two) times daily for 7 days.  Dispense: 14 tablet; Refill: 0  2. Sensation of fullness in right ear No abnormalities to right or left ear on examination today.  Will start flonase 2 sprays daily to help with nasal inflammation and possible eustachian tube dysfunction.  If symptoms do not improve after use of flonase for 2 days, can start on Augmentin for presumed bacterial sinus infection.  Follow up if symptoms do not improve after treatment.   - fluticasone (FLONASE) 50 MCG/ACT nasal spray; Place 2 sprays into both nostrils daily.  Dispense: 16 g; Refill: 6    Follow up plan: No follow-ups on file.

## 2020-08-14 ENCOUNTER — Other Ambulatory Visit: Payer: Self-pay | Admitting: Family Medicine

## 2020-08-23 DIAGNOSIS — M5416 Radiculopathy, lumbar region: Secondary | ICD-10-CM | POA: Diagnosis not present

## 2020-08-23 DIAGNOSIS — M48061 Spinal stenosis, lumbar region without neurogenic claudication: Secondary | ICD-10-CM | POA: Diagnosis not present

## 2020-08-31 ENCOUNTER — Other Ambulatory Visit: Payer: Self-pay

## 2020-08-31 ENCOUNTER — Other Ambulatory Visit: Payer: Medicare HMO

## 2020-08-31 DIAGNOSIS — E7800 Pure hypercholesterolemia, unspecified: Secondary | ICD-10-CM | POA: Diagnosis not present

## 2020-08-31 DIAGNOSIS — I5032 Chronic diastolic (congestive) heart failure: Secondary | ICD-10-CM

## 2020-08-31 DIAGNOSIS — Z136 Encounter for screening for cardiovascular disorders: Secondary | ICD-10-CM

## 2020-08-31 DIAGNOSIS — I10 Essential (primary) hypertension: Secondary | ICD-10-CM | POA: Diagnosis not present

## 2020-08-31 DIAGNOSIS — Z13220 Encounter for screening for lipoid disorders: Secondary | ICD-10-CM

## 2020-09-01 LAB — CBC WITH DIFFERENTIAL/PLATELET
Absolute Monocytes: 511 cells/uL (ref 200–950)
Basophils Absolute: 52 cells/uL (ref 0–200)
Basophils Relative: 0.7 %
Eosinophils Absolute: 281 cells/uL (ref 15–500)
Eosinophils Relative: 3.8 %
HCT: 38.6 % (ref 35.0–45.0)
Hemoglobin: 12.5 g/dL (ref 11.7–15.5)
Lymphs Abs: 1465 cells/uL (ref 850–3900)
MCH: 28.3 pg (ref 27.0–33.0)
MCHC: 32.4 g/dL (ref 32.0–36.0)
MCV: 87.5 fL (ref 80.0–100.0)
MPV: 12.2 fL (ref 7.5–12.5)
Monocytes Relative: 6.9 %
Neutro Abs: 5091 cells/uL (ref 1500–7800)
Neutrophils Relative %: 68.8 %
Platelets: 222 10*3/uL (ref 140–400)
RBC: 4.41 10*6/uL (ref 3.80–5.10)
RDW: 13.1 % (ref 11.0–15.0)
Total Lymphocyte: 19.8 %
WBC: 7.4 10*3/uL (ref 3.8–10.8)

## 2020-09-01 LAB — COMPLETE METABOLIC PANEL WITH GFR
AG Ratio: 1.7 (calc) (ref 1.0–2.5)
ALT: 16 U/L (ref 6–29)
AST: 16 U/L (ref 10–35)
Albumin: 4 g/dL (ref 3.6–5.1)
Alkaline phosphatase (APISO): 48 U/L (ref 37–153)
BUN: 12 mg/dL (ref 7–25)
CO2: 32 mmol/L (ref 20–32)
Calcium: 9.1 mg/dL (ref 8.6–10.4)
Chloride: 103 mmol/L (ref 98–110)
Creat: 0.99 mg/dL (ref 0.60–1.00)
Globulin: 2.4 g/dL (calc) (ref 1.9–3.7)
Glucose, Bld: 94 mg/dL (ref 65–99)
Potassium: 4.1 mmol/L (ref 3.5–5.3)
Sodium: 143 mmol/L (ref 135–146)
Total Bilirubin: 0.3 mg/dL (ref 0.2–1.2)
Total Protein: 6.4 g/dL (ref 6.1–8.1)
eGFR: 60 mL/min/{1.73_m2} (ref >=60)

## 2020-09-01 LAB — LIPID PANEL
Cholesterol: 127 mg/dL (ref <200)
HDL: 48 mg/dL — ABNORMAL LOW (ref >=50)
LDL Cholesterol (Calc): 65 mg/dL (calc)
Non-HDL Cholesterol (Calc): 79 mg/dL (calc) (ref <130)
Total CHOL/HDL Ratio: 2.6 (calc) (ref <5.0)
Triglycerides: 63 mg/dL (ref <150)

## 2020-09-03 ENCOUNTER — Other Ambulatory Visit: Payer: Self-pay | Admitting: Family Medicine

## 2020-09-04 ENCOUNTER — Other Ambulatory Visit: Payer: Self-pay

## 2020-09-04 ENCOUNTER — Encounter: Payer: Self-pay | Admitting: Family Medicine

## 2020-09-04 ENCOUNTER — Ambulatory Visit (INDEPENDENT_AMBULATORY_CARE_PROVIDER_SITE_OTHER): Payer: Medicare HMO | Admitting: Family Medicine

## 2020-09-04 VITALS — BP 134/82 | HR 90 | Temp 98.0°F | Resp 14 | Ht 64.0 in | Wt 256.0 lb

## 2020-09-04 DIAGNOSIS — E7800 Pure hypercholesterolemia, unspecified: Secondary | ICD-10-CM | POA: Diagnosis not present

## 2020-09-04 DIAGNOSIS — Z0000 Encounter for general adult medical examination without abnormal findings: Secondary | ICD-10-CM

## 2020-09-04 DIAGNOSIS — I5032 Chronic diastolic (congestive) heart failure: Secondary | ICD-10-CM | POA: Diagnosis not present

## 2020-09-04 DIAGNOSIS — Z0001 Encounter for general adult medical examination with abnormal findings: Secondary | ICD-10-CM | POA: Diagnosis not present

## 2020-09-04 DIAGNOSIS — M48061 Spinal stenosis, lumbar region without neurogenic claudication: Secondary | ICD-10-CM

## 2020-09-04 DIAGNOSIS — I10 Essential (primary) hypertension: Secondary | ICD-10-CM | POA: Diagnosis not present

## 2020-09-04 DIAGNOSIS — H6981 Other specified disorders of Eustachian tube, right ear: Secondary | ICD-10-CM

## 2020-09-04 NOTE — Progress Notes (Signed)
Subjective:    Patient ID: [name], female    DOB: February 21, 1945, 75 y.o.   MRN: [id number]  HPI Patient is a very pleasant 75 year old African-American female who presents today for complete physical exam.  Past medical history is significant for left ventricular septal hypertrophy seen on an echocardiogram.  She is currently following up with cardiology.  She also has a history of hypertension.  She had to discontinue Aldactone due to an allergic reaction.  However her blood pressure today is well controlled at 134/82.  She denies any chest pain or shortness of breath and dyspnea on activity.  However she is also very sedentary.  Her mammogram was performed in March and is normal.  Her last colonoscopy was performed in 2020 and was negative for any tubular adenoma however they recommended a repeat colonoscopy in 5 years due to her family history.  She does not require Pap smear due to age.  Her last bone density test was performed in 2018 and was outstanding and is not due again until 2023.  Her most recent immunizations are shown below.  She is due for a seasonal flu shot this fall as well as her fourth COVID-vaccine and Shingrix.  She denies any falls or depression or memory loss. Immunization History  Administered Date(s) Administered   Fluad Quad(high Dose 65+) 10/06/2018, 11/07/2019   Influenza, High Dose Seasonal PF 12/03/2017   Influenza,inj,Quad PF,6+ Mos 11/06/2015, 11/03/2016   PFIZER(Purple Top)SARS-COV-2 Vaccination 12/05/2019   Pneumococcal Conjugate-13 10/20/2014   Pneumococcal Polysaccharide-23 06/17/2012    Past Medical History:  Diagnosis Date   Allergy    Arthritis    arthritis -knees   Colon polyps    benign- last colonoscopy 09-2008   Depression    GERD (gastroesophageal reflux disease)    hx GERD   H/O seasonal allergies    no problems now   Hyperlipidemia    Hypertension    Murmur, heart    Spinal stenosis of lumbar region at multiple levels    Past  Surgical History:  Procedure Laterality Date   ABDOMINAL HYSTERECTOMY     APPENDECTOMY     with gallbladder surgery   BUNIONECTOMY Bilateral    CHOLECYSTECTOMY     open-gallstones   COLONOSCOPY     EXCISION OF ADNEXAL MASS Left August 03, 2013   at [facility] [place]/right adnexal nodule,excision;benigh lymph node   OOPHORECTOMY Bilateral 2008   ovarian cyst   POLYPECTOMY     Current Outpatient Medications on File Prior to Visit  Medication Sig Dispense Refill   albuterol (VENTOLIN HFA) 108 (90 Base) MCG/ACT inhaler Inhale 2 puffs into the lungs every 4 (four) hours as needed for wheezing or shortness of breath. 16 g 5   Ascorbic Acid (VITAMIN C GUMMIES PO) Take by mouth. Natures Made Vit C gummies     diltiazem (CARDIZEM CD) 360 MG 24 hr capsule Take 1 capsule by mouth once daily 90 capsule 0   diphenhydrAMINE (BENADRYL) 25 mg capsule Take 25 mg by mouth every 6 (six) hours as needed.     doxazosin (CARDURA) 2 MG tablet Take 1 tablet by mouth once daily 30 tablet 0   fluticasone (FLONASE) 50 MCG/ACT nasal spray Place 2 sprays into both nostrils daily. 16 g 6   furosemide (LASIX) 40 MG tablet Take 1 tablet (40 mg total) by mouth 2 (two) times daily. 180 tablet 3   Guaifenesin 1200 MG TB12 Take by mouth. Mucinex 1200 mg as needed  lisinopril (ZESTRIL) 40 MG tablet Take 1 tablet (40 mg total) by mouth daily. 90 tablet 3   loratadine (CLARITIN) 10 MG tablet Take 10 mg by mouth daily.     naproxen (NAPROSYN) 500 MG tablet TAKE 1 TABLET BY MOUTH TWICE DAILY AS NEEDED FOR MILD PAIN 180 tablet 0   omeprazole (PRILOSEC) 40 MG capsule Take 1 capsule by mouth once daily 30 capsule 0   Polyethylene Glycol 3350 (MIRALAX PO) Take by mouth once. 238 garms for colon prep with Dulcolax 5 mg tabs x 4 for colon 11-18     rosuvastatin (CRESTOR) 10 MG tablet Take 1 tablet (10 mg total) by mouth daily. 90 tablet 3   tetrahydrozoline 0.05 % ophthalmic solution Place 1 drop into both eyes as needed (eye allergies).      Current Facility-Administered Medications on File Prior to Visit  Medication Dose Route Frequency Provider Last Rate Last Admin   0.9 %  sodium chloride infusion  500 mL Intravenous Continuous Milus Banister, MD       Allergies  Allergen Reactions   Aldactone [Spironolactone] Rash   Social History   Socioeconomic History   Marital status: Married    Spouse name: Not on file   Number of children: Not on file   Years of education: Not on file   Highest education level: Not on file  Occupational History   Not on file  Tobacco Use   Smoking status: Never   Smokeless tobacco: Never  Substance and Sexual Activity   Alcohol use: No   Drug use: No   Sexual activity: Yes  Other Topics Concern   Not on file  Social History Narrative   Not on file   Social Determinants of Health   Financial Resource Strain: Low Risk    Difficulty of Paying Living Expenses: Not hard at all  Food Insecurity: No Food Insecurity   Worried About Running Out of Food in the Last Year: Never true   [place] in the Last Year: Never true  Transportation Needs: No Transportation Needs   Lack of Transportation (Medical): No   Lack of Transportation (Non-Medical): No  Physical Activity: Inactive   Days of Exercise per Week: 0 days   Minutes of Exercise per Session: 0 min  Stress: No Stress Concern Present   Feeling of Stress : Not at all  Social Connections: Moderately Isolated   Frequency of Communication with Friends and Family: More than three times a week   Frequency of Social Gatherings with Friends and Family: Three times a week   Attends Religious Services: More than 4 times per year   Active Member of Clubs or Organizations: No   Attends Archivist Meetings: Never   Marital Status: Widowed  Human resources officer Violence: Not At Risk   Fear of Current or Ex-Partner: No   Emotionally Abused: No   Physically Abused: No   Sexually Abused: No   Family History  Problem  Relation Age of Onset   Colon cancer Mother 39   Heart attack Father    Breast cancer Paternal Aunt    Colon cancer Brother 62   Colon polyps Brother    Esophageal cancer Neg Hx    Pancreatic cancer Neg Hx    Rectal cancer Neg Hx    Stomach cancer Neg Hx       Review of Systems  All other systems reviewed and are negative.     Objective:   Physical Exam  Vitals reviewed.  Constitutional:      General: She is not in acute distress.    Appearance: Normal appearance. She is normal weight. She is not ill-appearing, toxic-appearing or diaphoretic.  HENT:     Head: Normocephalic and atraumatic.     Right Ear: Tympanic membrane, ear canal and external ear normal. There is no impacted cerumen.     Left Ear: Tympanic membrane, ear canal and external ear normal. There is no impacted cerumen.     Nose: Nose normal. No congestion or rhinorrhea.     Mouth/Throat:     Mouth: Mucous membranes are moist.     Pharynx: No oropharyngeal exudate or posterior oropharyngeal erythema.  Eyes:     General: No scleral icterus.       Right eye: No discharge.        Left eye: No discharge.     Extraocular Movements: Extraocular movements intact.     Conjunctiva/sclera: Conjunctivae normal.     Pupils: Pupils are equal, round, and reactive to light.  Neck:     Vascular: No carotid bruit.  Cardiovascular:     Rate and Rhythm: Normal rate and regular rhythm.     Pulses: Normal pulses.     Heart sounds: Murmur heard.    No friction rub. No gallop.  Pulmonary:     Effort: Pulmonary effort is normal. No respiratory distress.     Breath sounds: Normal breath sounds. No stridor. No wheezing, rhonchi or rales.  Chest:     Chest wall: No tenderness.  Abdominal:     General: Bowel sounds are normal. There is no distension.     Palpations: Abdomen is soft. There is no mass.     Tenderness: There is no abdominal tenderness. There is no right CVA tenderness, left CVA tenderness, guarding or rebound.      Hernia: No hernia is present.  Musculoskeletal:        General: No swelling, deformity or signs of injury.     Right shoulder: Tenderness present. Decreased range of motion.     Cervical back: Normal range of motion and neck supple. Tenderness present. No rigidity. No muscular tenderness.     Lumbar back: Tenderness present.     Right knee: Decreased range of motion. Tenderness present.     Left knee: Decreased range of motion. Tenderness present.     Right lower leg: No edema.     Left lower leg: No edema.  Lymphadenopathy:     Cervical: No cervical adenopathy.  Skin:    General: Skin is warm.     Coloration: Skin is not jaundiced or pale.     Findings: No bruising, erythema, lesion or rash.  Neurological:     General: No focal deficit present.     Mental Status: She is alert and oriented to person, place, and time. Mental status is at baseline.     Cranial Nerves: No cranial nerve deficit.     Sensory: No sensory deficit.     Motor: No weakness.     Coordination: Coordination normal.     Gait: Gait normal.     Deep Tendon Reflexes: Reflexes normal.  Psychiatric:        Mood and Affect: Mood normal.        Behavior: Behavior normal.        Thought Content: Thought content normal.        Judgment: Judgment normal.          Assessment &  Plan:  General medical exam  Morbid obesity ([place])  Spinal stenosis of lumbar region at multiple levels  Pure hypercholesterolemia  Essential (primary) hypertension  Chronic heart failure with preserved ejection fraction (HCC)  Dysfunction of right eustachian tube Patient does report decreased hearing in the right ear and a sense of fullness like she cannot equalize pressure in her ear.  There does appear to be a possible middle ear effusion behind the right tympanic membrane as well as some old scar tissue and opacity seen on the inferior portion of the tympanic membrane.  I believe she is dealing with eustachian tube dysfunction so I  will consult ENT.  Blood pressure today is well controlled.  Most recent lab work is listed below: Lab on 08/31/2020  Component Date Value Ref Range Status   Glucose, Bld 08/31/2020 94  65 - 99 mg/dL Final   Comment: .            Fasting reference interval .    BUN 08/31/2020 12  7 - 25 mg/dL Final   Creat 08/31/2020 0.99  0.60 - 1.00 mg/dL Final   eGFR 08/31/2020 60  > OR = 60 mL/min/1.29m2 Final   Comment: The eGFR is based on the CKD-EPI 2021 equation. To calculate  the new eGFR from a previous Creatinine or Cystatin C result, go to https://www.kidney.org/professionals/ kdoqi/gfr%5Fcalculator    BUN/Creatinine Ratio 67/20/9470 NOT APPLICABLE  6 - 22 (calc) Final   Sodium 08/31/2020 143  135 - 146 mmol/L Final   Potassium 08/31/2020 4.1  3.5 - 5.3 mmol/L Final   Chloride 08/31/2020 103  98 - 110 mmol/L Final   CO2 08/31/2020 32  20 - 32 mmol/L Final   Calcium 08/31/2020 9.1  8.6 - 10.4 mg/dL Final   Total Protein 08/31/2020 6.4  6.1 - 8.1 g/dL Final   Albumin 08/31/2020 4.0  3.6 - 5.1 g/dL Final   Globulin 08/31/2020 2.4  1.9 - 3.7 g/dL (calc) Final   AG Ratio 08/31/2020 1.7  1.0 - 2.5 (calc) Final   Total Bilirubin 08/31/2020 0.3  0.2 - 1.2 mg/dL Final   Alkaline phosphatase (APISO) 08/31/2020 48  37 - 153 U/L Final   AST 08/31/2020 16  10 - 35 U/L Final   ALT 08/31/2020 16  6 - 29 U/L Final   WBC 08/31/2020 7.4  3.8 - 10.8 Thousand/uL Final   RBC 08/31/2020 4.41  3.80 - 5.10 Million/uL Final   Hemoglobin 08/31/2020 12.5  11.7 - 15.5 g/dL Final   HCT 08/31/2020 38.6  35.0 - 45.0 % Final   MCV 08/31/2020 87.5  80.0 - 100.0 fL Final   MCH 08/31/2020 28.3  27.0 - 33.0 pg Final   MCHC 08/31/2020 32.4  32.0 - 36.0 g/dL Final   RDW 08/31/2020 13.1  11.0 - 15.0 % Final   Platelets 08/31/2020 222  140 - 400 Thousand/uL Final   MPV 08/31/2020 12.2  7.5 - 12.5 fL Final   Neutro Abs 08/31/2020 5,091  1,500 - 7,800 cells/uL Final   Lymphs Abs 08/31/2020 1,465  850 - 3,900 cells/uL  Final   Absolute Monocytes 08/31/2020 511  200 - 950 cells/uL Final   Eosinophils Absolute 08/31/2020 281  15 - 500 cells/uL Final   Basophils Absolute 08/31/2020 52  0 - 200 cells/uL Final   Neutrophils Relative % 08/31/2020 68.8  % Final   Total Lymphocyte 08/31/2020 19.8  % Final   Monocytes Relative 08/31/2020 6.9  % Final   Eosinophils Relative 08/31/2020  3.8  % Final   Basophils Relative 08/31/2020 0.7  % Final   Cholesterol 08/31/2020 127  <200 mg/dL Final   HDL 08/31/2020 48 (A) > OR = 50 mg/dL Final   Triglycerides 08/31/2020 63  <150 mg/dL Final   LDL Cholesterol (Calc) 08/31/2020 65  mg/dL (calc) Final   Comment: Reference range: <100 . Desirable range <100 mg/dL for primary prevention;   <70 mg/dL for patients with CHD or diabetic patients  with > or = 2 CHD risk factors. Marland Kitchen LDL-C is now calculated using the Martin-Hopkins  calculation, which is a validated novel method providing  better accuracy than the Friedewald equation in the  estimation of LDL-C.  Cresenciano Genre et al. Annamaria Helling. 1464;314(27): 2061-2068  (http://education.[facility].com/faq/FAQ164)    Total CHOL/HDL Ratio 08/31/2020 2.6  <5.0 (calc) Final   Non-HDL Cholesterol (Calc) 08/31/2020 79  <130 mg/dL (calc) Final   Comment: For patients with diabetes plus 1 major ASCVD risk  factor, treating to a non-HDL-C goal of <100 mg/dL  (LDL-C of <70 mg/dL) is considered a therapeutic  option.    Lab work is outstanding.  I recommended Shingrix, seasonal flu shot, and her fourth COVID booster.  The remainder of her preventative care is up-to-date.

## 2020-09-05 ENCOUNTER — Telehealth: Payer: Self-pay | Admitting: Pharmacist

## 2020-09-05 NOTE — Progress Notes (Addendum)
Chronic Care Management Pharmacy Assistant   Name: [name]  MRN: [id number] DOB: Jun 25, 1945  Reason for Encounter: Disease State For HTN.    Conditions to be addressed/monitored: HTN, HFpEF, Arthritis, HLD  Recent office visits:  09/04/20 Dr. Dennard Schaumann For general medical exam. No medication changes.  08/10/20 Eulogio Bear, NP. For sinus pressure. STARTED Amoxicillin-Pot Clavulanate 875-125 mg 1 tablet 2 times daily and Fluticasone propionate 50 MCG/ACT 2 sprays each mare daily.   Recent consult visits:  07/24/20 Anesthesiology Reece Agar. No information given.   Hospital visits:  None since 07/09/20  Medications: Outpatient Encounter Medications as of 09/05/2020  Medication Sig   albuterol (VENTOLIN HFA) 108 (90 Base) MCG/ACT inhaler Inhale 2 puffs into the lungs every 4 (four) hours as needed for wheezing or shortness of breath.   Ascorbic Acid (VITAMIN C GUMMIES PO) Take by mouth. Natures Made Vit C gummies   diltiazem (CARDIZEM CD) 360 MG 24 hr capsule Take 1 capsule by mouth once daily   diphenhydrAMINE (BENADRYL) 25 mg capsule Take 25 mg by mouth every 6 (six) hours as needed.   doxazosin (CARDURA) 2 MG tablet Take 1 tablet by mouth once daily   fluticasone (FLONASE) 50 MCG/ACT nasal spray Place 2 sprays into both nostrils daily.   furosemide (LASIX) 40 MG tablet Take 1 tablet (40 mg total) by mouth 2 (two) times daily.   Guaifenesin 1200 MG TB12 Take by mouth. Mucinex 1200 mg as needed   lisinopril (ZESTRIL) 40 MG tablet Take 1 tablet (40 mg total) by mouth daily.   loratadine (CLARITIN) 10 MG tablet Take 10 mg by mouth daily.   naproxen (NAPROSYN) 500 MG tablet TAKE 1 TABLET BY MOUTH TWICE DAILY AS NEEDED FOR MILD PAIN   omeprazole (PRILOSEC) 40 MG capsule Take 1 capsule by mouth once daily   Polyethylene Glycol 3350 (MIRALAX PO) Take by mouth once. 238 garms for colon prep with Dulcolax 5 mg tabs x 4 for colon 11-18   rosuvastatin (CRESTOR) 10 MG tablet Take  1 tablet (10 mg total) by mouth daily.   tetrahydrozoline 0.05 % ophthalmic solution Place 1 drop into both eyes as needed (eye allergies).   Facility-Administered Encounter Medications as of 09/05/2020  Medication   0.9 %  sodium chloride infusion   Reviewed chart prior to disease state call. Spoke with patient regarding BP  Recent Office Vitals: BP Readings from Last 3 Encounters:  09/04/20 134/82  08/10/20 138/78  03/23/20 (!) 152/80   Pulse Readings from Last 3 Encounters:  09/04/20 90  08/10/20 74  03/23/20 73    Wt Readings from Last 3 Encounters:  09/04/20 256 lb (116.1 kg)  08/10/20 257 lb 3.2 oz (116.7 kg)  03/23/20 263 lb 14.4 oz (119.7 kg)     Kidney Function Lab Results  Component Value Date/Time   CREATININE 0.99 08/31/2020 08:18 AM   CREATININE 1.04 (H) 04/02/2020 10:21 AM   CREATININE 1.04 (H) 01/04/2020 03:06 PM   CREATININE 0.85 11/07/2019 12:28 PM   GFRNONAA 54 (L) 01/04/2020 03:06 PM   GFRNONAA 68 11/07/2019 12:28 PM   GFRAA 62 01/04/2020 03:06 PM   GFRAA 79 11/07/2019 12:28 PM    BMP Latest Ref Rng & Units 08/31/2020 04/02/2020 01/04/2020  Glucose 65 - 99 mg/dL 94 100(H) 115(H)  BUN 7 - 25 mg/dL '12 11 14  '$ Creatinine 0.60 - 1.00 mg/dL 0.99 1.04(H) 1.04(H)  BUN/Creat Ratio 6 - 22 (calc) NOT APPLICABLE 123456) 13  Sodium  135 - 146 mmol/L 143 142 142  Potassium 3.5 - 5.3 mmol/L 4.1 3.5 3.9  Chloride 98 - 110 mmol/L 103 101 101  CO2 20 - 32 mmol/L 32 26 25  Calcium 8.6 - 10.4 mg/dL 9.1 8.8 9.1    Current antihypertensive regimen:  Lisinopril '40mg'$  daily Diltiazem CD '360mg'$  24hr daily Doxazosin '2mg'$  daily  How often are you checking your Blood Pressure? Patient stated 3-5x per week  Current home BP readings: Patient stated on 09/04/20-134/78   What recent interventions/DTPs have been made by any provider to improve Blood Pressure control since last CPP Visit: None.  Any recent hospitalizations or ED visits since last visit with CPP? Patient stated  no.  What diet changes have been made to improve Blood Pressure Control?  Patient stated he eats two meals a day. She stated she is drinking plenty of water.   What exercise is being done to improve your Blood Pressure Control?  Patient stated she is busy doing everyday duties at home but does not do any regular exercising.   Adherence Review: Is the patient currently on ACE/ARB medication? N/A  Does the patient have >5 day gap between last estimated fill dates? N/A  Star Rating Drugs: Rosuvastatin 10 mg, Lisinopril 40 mg,   Follow-Up:Pharmacist Review  Charlann Lange, RMA Clinical Pharmacist Assistant [phone]  10 minutes spent in review, coordination, and documentation.  Reviewed by: Beverly Milch, PharmD Clinical Pharmacist [phone]

## 2020-09-10 ENCOUNTER — Other Ambulatory Visit: Payer: Self-pay

## 2020-09-10 ENCOUNTER — Telehealth: Payer: Self-pay

## 2020-09-10 ENCOUNTER — Other Ambulatory Visit: Payer: Self-pay | Admitting: Family Medicine

## 2020-09-10 DIAGNOSIS — H6981 Other specified disorders of Eustachian tube, right ear: Secondary | ICD-10-CM

## 2020-09-10 NOTE — Telephone Encounter (Signed)
Referral placed.

## 2020-09-11 NOTE — Progress Notes (Signed)
Cardiology Office Note:    Date:  09/12/2020   ID:  [name], DOB 09-12-1945, MRN [id number]  PCP:  [name], MD  [facility] Cardiologist:  Werner Lean, MD  [place] Electrophysiologist:  None   CC: Follow up HFpEF  History of Present Illness:    [name] is a 75 y.o. female with a hx of Morbid Obesity,  HTN, HLD presenting for evaluation 12/26/20.  In interim, had normal CMR.  Stopped aldactone because of rash- see 03/23/20.  In interim of this visit,stable on increase lasix.  Patient had hesitancy to start rosuvastatin 10 given husbands problems with atorvastatin in the past.  Started in and is doing well. Seen 09/12/20.  Patient notes that she is doing good, other than her back..  Since last visit notes  changes.  There are no interval hospital/ED visit.    No chest pain or pressure .  No SOB/DOE and no PND/Orthopnea.  No weight gain or leg swelling.  No palpitations or syncope.  Ambulatory blood pressure 128/80.   Past Medical History:  Diagnosis Date   Allergy    Arthritis    arthritis -knees   Colon polyps    benign- last colonoscopy 09-2008   Depression    GERD (gastroesophageal reflux disease)    hx GERD   H/O seasonal allergies    no problems now   Hyperlipidemia    Hypertension    Murmur, heart    Spinal stenosis of lumbar region at multiple levels     Past Surgical History:  Procedure Laterality Date   ABDOMINAL HYSTERECTOMY     APPENDECTOMY     with gallbladder surgery   BUNIONECTOMY Bilateral    CHOLECYSTECTOMY     open-gallstones   COLONOSCOPY     EXCISION OF ADNEXAL MASS Left August 03, 2013   at [facility] [place]/right adnexal nodule,excision;benigh lymph node   OOPHORECTOMY Bilateral 2008   ovarian cyst   POLYPECTOMY      Current Medications: Current Meds  Medication Sig   albuterol (VENTOLIN HFA) 108 (90 Base) MCG/ACT inhaler Inhale 2 puffs into the lungs every 4 (four) hours as needed for wheezing or shortness  of breath.   Ascorbic Acid (VITAMIN C GUMMIES PO) Take by mouth. Natures Made Vit C gummies   diltiazem (CARDIZEM CD) 360 MG 24 hr capsule Take 1 capsule by mouth once daily   diphenhydrAMINE (BENADRYL) 25 mg capsule Take 25 mg by mouth every 6 (six) hours as needed.   doxazosin (CARDURA) 2 MG tablet Take 1 tablet by mouth once daily   fluticasone (FLONASE) 50 MCG/ACT nasal spray Place 2 sprays into both nostrils daily.   furosemide (LASIX) 40 MG tablet Take 1 tablet (40 mg total) by mouth 2 (two) times daily.   Guaifenesin 1200 MG TB12 Take by mouth. Mucinex 1200 mg as needed   lisinopril (ZESTRIL) 40 MG tablet Take 1 tablet (40 mg total) by mouth daily.   loratadine (CLARITIN) 10 MG tablet Take 10 mg by mouth daily.   naproxen (NAPROSYN) 500 MG tablet TAKE 1 TABLET BY MOUTH TWICE DAILY AS NEEDED FOR MILD PAIN   omeprazole (PRILOSEC) 40 MG capsule Take 1 capsule by mouth once daily   Polyethylene Glycol 3350 (MIRALAX PO) Take by mouth as needed.   rosuvastatin (CRESTOR) 10 MG tablet Take 1 tablet (10 mg total) by mouth daily.   tetrahydrozoline 0.05 % ophthalmic solution Place 1 drop into both eyes as needed (eye allergies).   [  DISCONTINUED] Polyethylene Glycol 3350 (MIRALAX PO) Take by mouth once. 238 garms for colon prep with Dulcolax 5 mg tabs x 4 for colon 11-18   Current Facility-Administered Medications for the 09/12/20 encounter (Office Visit) with Werner Lean, MD  Medication   0.9 %  sodium chloride infusion     Allergies:   Aldactone [spironolactone]   Social History   Socioeconomic History   Marital status: Married    Spouse name: Not on file   Number of children: Not on file   Years of education: Not on file   Highest education level: Not on file  Occupational History   Not on file  Tobacco Use   Smoking status: Never   Smokeless tobacco: Never  Substance and Sexual Activity   Alcohol use: No   Drug use: No   Sexual activity: Yes  Other Topics Concern    Not on file  Social History Narrative   Not on file   Social Determinants of Health   Financial Resource Strain: Low Risk    Difficulty of Paying Living Expenses: Not hard at all  Food Insecurity: No Food Insecurity   Worried About Charity fundraiser in the Last Year: Never true   [place] in the Last Year: Never true  Transportation Needs: No Transportation Needs   Lack of Transportation (Medical): No   Lack of Transportation (Non-Medical): No  Physical Activity: Inactive   Days of Exercise per Week: 0 days   Minutes of Exercise per Session: 0 min  Stress: No Stress Concern Present   Feeling of Stress : Not at all  Social Connections: Moderately Isolated   Frequency of Communication with Friends and Family: More than three times a week   Frequency of Social Gatherings with Friends and Family: Three times a week   Attends Religious Services: More than 4 times per year   Active Member of Clubs or Organizations: No   Attends Archivist Meetings: Never   Marital Status: Widowed    Social: talked about hiding an egg for teething -kids at our second visit.  We usually tell stories about kids.  Family History: The patient's family history includes Breast cancer in her paternal aunt; Colon cancer (age of onset: 80) in her mother; Colon cancer (age of onset: 20) in her brother; Colon polyps in her brother; Heart attack in her father. There is no history of Esophageal cancer, Pancreatic cancer, Rectal cancer, or Stomach cancer. Father had MI and died at 76- no heart catheterization Son had MI and died at age 18- no heart catheterization Brother had CHF. No family history of hypertrophic cardiomyopathy. Brother- died in 51s.  ROS:   Please see the history of present illness.    All other systems reviewed and are negative.  EKGs/Labs/Other Studies Reviewed:    The following studies were reviewed today:  EKG:   12/26/20 sinus rhythm rate 79 no pathologic q  waves  Transthoracic Echocardiogram: Date: 12/07/19 Results: Basal Septal Hypertrophy with Chordal SAM,Septal basal ration ~ 1.3  1. Left ventricular ejection fraction, by estimation, is 60 to 65%. The  left ventricle has normal function. The left ventricle has no regional  wall motion abnormalities. There is severe asymmetric left ventricular  hypertrophy of the basal and septal  segments. Left ventricular diastolic parameters are consistent with Grade  I diastolic dysfunction (impaired relaxation).   2. Right ventricular systolic function is normal. The right ventricular  size is normal.   3.  Left atrial size was mildly dilated.   4. The mitral valve is normal in structure. No evidence of mitral valve  regurgitation. No evidence of mitral stenosis.   5. The aortic valve is tricuspid. Aortic valve regurgitation is trivial.  Mild to moderate aortic valve sclerosis/calcification is present, without  any evidence of aortic stenosis.   6. The inferior vena cava is normal in size with greater than 50%  respiratory variability, suggesting right atrial pressure of 3 mmHg.   CMR: Date: 03/21/20 Results: IMPRESSION: 1. Normal left ventricular size, with asymmetric basal septal hypertrophy and normal systolic function (LVEF = 62%). There are no regional wall motion abnormalities and no late gadolinium enhancement in the left ventricular myocardium. ECV is 28%. The maximum thickness of the basal septum is 14 mm, there is no LGE, no LVOT gradient or SAM.  These findings are not supportive of hypertrophic cardiomyopathy.  2. Normal right ventricular size, thickness and systolic function (RVEF = 62%). There are no regional wall motion abnormalities.  3. Mildly dilated left atrium, normal right atrial size.  4. Normal size of the aortic root, ascending aorta and pulmonary artery.  5.  No significant valvular abnormalities.  6.  Normal pericardium.  Trivial pericardial effusion.   Recent  Labs: 11/07/2019: Brain Natriuretic Peptide 42 12/27/2019: TSH 2.020 01/04/2020: Magnesium 2.1 04/02/2020: NT-Pro BNP 60 08/31/2020: ALT 16; BUN 12; Creat 0.99; Hemoglobin 12.5; Platelets 222; Potassium 4.1; Sodium 143  Recent Lipid Panel    Component Value Date/Time   CHOL 127 08/31/2020 0818   CHOL 128 07/12/2020 0753   TRIG 63 08/31/2020 0818   HDL 48 (L) 08/31/2020 0818   HDL 45 07/12/2020 0753   CHOLHDL 2.6 08/31/2020 0818   VLDL 20 10/29/2015 0902   LDLCALC 65 08/31/2020 0818    Physical Exam:    VS:  BP 140/72   Pulse 71   Ht '5\' 4"'$  (1.626 m)   Wt 255 lb (115.7 kg)   SpO2 95%   BMI 43.77 kg/m     Wt Readings from Last 3 Encounters:  09/12/20 255 lb (115.7 kg)  09/04/20 256 lb (116.1 kg)  08/10/20 257 lb 3.2 oz (116.7 kg)     GEN: Obese female, well developed in no acute distress HEENT: Normal NECK: JVD to mid neck but nadir to clavicle LYMPHATICS: No lymphadenopathy CARDIAC: RRR, II/VI holosystolic murmur no rubs, gallops RESPIRATORY:  Clear to auscultation without rales, wheezing or rhonchi  ABDOMEN: Soft, non-tender, non-distended MUSCULOSKELETAL:  Non-pitting edema bilaterally ; No deformity  SKIN: Warm and dry NEUROLOGIC:  Alert and oriented x 3 PSYCHIATRIC:  Normal affect   ASSESSMENT:    1. Chronic heart failure with preserved ejection fraction ([place])   2. Morbid obesity ([place])   3. Essential (primary) hypertension   4. Pure hypercholesterolemia     PLAN:    In order of problems listed above:  Heart Failure Preserved Ejection Fraction  Morbid Obesity Essential HTN - NYHA class I, Stage C, slightly hypervolemic, etiology from HTN and Obesity - Diuretic regimen: Lasix 40 mg PO BID - discussed red flag symptoms for her to call in; low threshold to add SGLT2i - allergy to aldactone (itching) - AMB BP at goal  Hyperlipidemia  -LDL goal less than 100 (at goal this August) - continue rosuvastatin 10 mg   Six months follow up unless new symptoms or  abnormal test results warranting change in plan   Medication Adjustments/Labs and Tests Ordered: Current medicines are reviewed at  length with the patient today.  Concerns regarding medicines are outlined above.  No orders of the defined types were placed in this encounter.  No orders of the defined types were placed in this encounter.   Patient Instructions  Medication Instructions:  Your physician recommends that you continue on your current medications as directed. Please refer to the Current Medication list given to you today.  *If you need a refill on your cardiac medications before your next appointment, please call your pharmacy*   Lab Work: NONE If you have labs (blood work) drawn today and your tests are completely normal, you will receive your results only by: [place] (if you have MyChart) OR A paper copy in the mail If you have any lab test that is abnormal or we need to change your treatment, we will call you to review the results.   Testing/Procedures: NONE   Follow-Up: At [facility], you and your health needs are our priority.  As part of our continuing mission to provide you with exceptional heart care, we have created designated Provider Care Teams.  These Care Teams include your primary Cardiologist (physician) and Advanced Practice Providers (APPs -  Physician Assistants and Nurse Practitioners) who all work together to provide you with the care you need, when you need it.  We recommend signing up for the patient portal called "MyChart".  Sign up information is provided on this After Visit Summary.  MyChart is used to connect with patients for Virtual Visits (Telemedicine).  Patients are able to view lab/test results, encounter notes, upcoming appointments, etc.  Non-urgent messages can be sent to your provider as well.   To learn more about what you can do with MyChart, go to [email or web address].    Your next appointment:   6 month(s)  The  format for your next appointment:   In Person  Provider:   You may see Werner Lean, MD or one of the following Advanced Practice Providers on your designated Care Team:   Melina Copa, PA-C Ermalinda Barrios, PA-C   Other Instructions Please call into the office if your weight increases by 3 pounds in 24 hours or 5 pounds in 1 week.  Also call if you become short of breath or notice leg swelling    Signed, Werner Lean, MD  09/12/2020 10:16 AM    [place]

## 2020-09-12 ENCOUNTER — Ambulatory Visit: Payer: Medicare HMO | Admitting: Internal Medicine

## 2020-09-12 ENCOUNTER — Other Ambulatory Visit: Payer: Self-pay

## 2020-09-12 ENCOUNTER — Encounter: Payer: Self-pay | Admitting: Internal Medicine

## 2020-09-12 VITALS — BP 140/72 | HR 71 | Ht 64.0 in | Wt 255.0 lb

## 2020-09-12 DIAGNOSIS — I5032 Chronic diastolic (congestive) heart failure: Secondary | ICD-10-CM

## 2020-09-12 DIAGNOSIS — I10 Essential (primary) hypertension: Secondary | ICD-10-CM

## 2020-09-12 DIAGNOSIS — E7800 Pure hypercholesterolemia, unspecified: Secondary | ICD-10-CM

## 2020-09-12 NOTE — Patient Instructions (Signed)
Medication Instructions:  Your physician recommends that you continue on your current medications as directed. Please refer to the Current Medication list given to you today.  *If you need a refill on your cardiac medications before your next appointment, please call your pharmacy*   Lab Work: NONE If you have labs (blood work) drawn today and your tests are completely normal, you will receive your results only by: [place] (if you have MyChart) OR A paper copy in the mail If you have any lab test that is abnormal or we need to change your treatment, we will call you to review the results.   Testing/Procedures: NONE   Follow-Up: At [facility], you and your health needs are our priority.  As part of our continuing mission to provide you with exceptional heart care, we have created designated Provider Care Teams.  These Care Teams include your primary Cardiologist (physician) and Advanced Practice Providers (APPs -  Physician Assistants and Nurse Practitioners) who all work together to provide you with the care you need, when you need it.  We recommend signing up for the patient portal called "MyChart".  Sign up information is provided on this After Visit Summary.  MyChart is used to connect with patients for Virtual Visits (Telemedicine).  Patients are able to view lab/test results, encounter notes, upcoming appointments, etc.  Non-urgent messages can be sent to your provider as well.   To learn more about what you can do with MyChart, go to [email or web address].    Your next appointment:   6 month(s)  The format for your next appointment:   In Person  Provider:   You may see Werner Lean, MD or one of the following Advanced Practice Providers on your designated Care Team:   Melina Copa, PA-C Ermalinda Barrios, PA-C   Other Instructions Please call into the office if your weight increases by 3 pounds in 24 hours or 5 pounds in 1 week.  Also call if you become  short of breath or notice leg swelling

## 2020-09-27 DIAGNOSIS — H524 Presbyopia: Secondary | ICD-10-CM | POA: Diagnosis not present

## 2020-09-27 DIAGNOSIS — H04123 Dry eye syndrome of bilateral lacrimal glands: Secondary | ICD-10-CM | POA: Diagnosis not present

## 2020-09-27 DIAGNOSIS — H40033 Anatomical narrow angle, bilateral: Secondary | ICD-10-CM | POA: Diagnosis not present

## 2020-09-27 DIAGNOSIS — H40013 Open angle with borderline findings, low risk, bilateral: Secondary | ICD-10-CM | POA: Diagnosis not present

## 2020-09-27 DIAGNOSIS — H35033 Hypertensive retinopathy, bilateral: Secondary | ICD-10-CM | POA: Diagnosis not present

## 2020-10-10 ENCOUNTER — Other Ambulatory Visit: Payer: Self-pay | Admitting: Family Medicine

## 2020-10-18 ENCOUNTER — Ambulatory Visit (INDEPENDENT_AMBULATORY_CARE_PROVIDER_SITE_OTHER): Payer: Medicare HMO | Admitting: Otolaryngology

## 2020-10-18 ENCOUNTER — Other Ambulatory Visit: Payer: Self-pay

## 2020-10-18 DIAGNOSIS — H6521 Chronic serous otitis media, right ear: Secondary | ICD-10-CM | POA: Diagnosis not present

## 2020-10-18 DIAGNOSIS — H6981 Other specified disorders of Eustachian tube, right ear: Secondary | ICD-10-CM

## 2020-10-18 DIAGNOSIS — J310 Chronic rhinitis: Secondary | ICD-10-CM | POA: Diagnosis not present

## 2020-10-18 MED ORDER — TRIAMCINOLONE ACETONIDE 55 MCG/ACT NA AERO: 2.0000 | INHALATION_SPRAY | Freq: Every day | NASAL | 12 refills | Status: DC

## 2020-10-18 MED ORDER — AMOXICILLIN-POT CLAVULANATE 875-125 MG PO TABS: 1.0000 | ORAL_TABLET | Freq: Two times a day (BID) | ORAL | 0 refills | Status: DC

## 2020-10-18 NOTE — Progress Notes (Signed)
HPI: [name] is a 75 y.o. female who presents is referred by her PCP for evaluation of blockage and fullness in the right ear that she has had for a couple months.  She feels like she has trouble equalizing pressure in the right ear.  She was seen by her PCP who felt like she had a right serous otitis.  She is not having any real pain.  She has mild nasal congestion but no purulent discharge from her nose.  Past Medical History:  Diagnosis Date   Allergy    Arthritis    arthritis -knees   Colon polyps    benign- last colonoscopy 09-2008   Depression    GERD (gastroesophageal reflux disease)    hx GERD   H/O seasonal allergies    no problems now   Hyperlipidemia    Hypertension    Murmur, heart    Spinal stenosis of lumbar region at multiple levels    Past Surgical History:  Procedure Laterality Date   ABDOMINAL HYSTERECTOMY     APPENDECTOMY     with gallbladder surgery   BUNIONECTOMY Bilateral    CHOLECYSTECTOMY     open-gallstones   COLONOSCOPY     EXCISION OF ADNEXAL MASS Left August 03, 2013   at [facility] [place]/right adnexal nodule,excision;benigh lymph node   OOPHORECTOMY Bilateral 2008   ovarian cyst   POLYPECTOMY     Social History   Socioeconomic History   Marital status: Married    Spouse name: Not on file   Number of children: Not on file   Years of education: Not on file   Highest education level: Not on file  Occupational History   Not on file  Tobacco Use   Smoking status: Never   Smokeless tobacco: Never  Substance and Sexual Activity   Alcohol use: No   Drug use: No   Sexual activity: Yes  Other Topics Concern   Not on file  Social History Narrative   Not on file   Social Determinants of Health   Financial Resource Strain: Low Risk    Difficulty of Paying Living Expenses: Not hard at all  Food Insecurity: No Food Insecurity   Worried About Charity fundraiser in the Last Year: Never true   [place] in the Last Year: Never true   Transportation Needs: No Transportation Needs   Lack of Transportation (Medical): No   Lack of Transportation (Non-Medical): No  Physical Activity: Inactive   Days of Exercise per Week: 0 days   Minutes of Exercise per Session: 0 min  Stress: No Stress Concern Present   Feeling of Stress : Not at all  Social Connections: Moderately Isolated   Frequency of Communication with Friends and Family: More than three times a week   Frequency of Social Gatherings with Friends and Family: Three times a week   Attends Religious Services: More than 4 times per year   Active Member of Clubs or Organizations: No   Attends Archivist Meetings: Never   Marital Status: Widowed   Family History  Problem Relation Age of Onset   Colon cancer Mother 59   Heart attack Father    Breast cancer Paternal Aunt    Colon cancer Brother 47   Colon polyps Brother    Esophageal cancer Neg Hx    Pancreatic cancer Neg Hx    Rectal cancer Neg Hx    Stomach cancer Neg Hx    Allergies  Allergen  Reactions   Aldactone [Spironolactone] Rash   Prior to Admission medications   Medication Sig Start Date End Date Taking? Authorizing Provider  albuterol (VENTOLIN HFA) 108 (90 Base) MCG/ACT inhaler Inhale 2 puffs into the lungs every 4 (four) hours as needed for wheezing or shortness of breath. 07/14/19   [name], MD  Ascorbic Acid (VITAMIN C GUMMIES PO) Take by mouth. Natures Made Vit C gummies    [provider]  diltiazem (CARDIZEM CD) 360 MG 24 hr capsule Take 1 capsule by mouth once daily 09/03/20   [name], MD  diphenhydrAMINE (BENADRYL) 25 mg capsule Take 25 mg by mouth every 6 (six) hours as needed.    [provider]  doxazosin (CARDURA) 2 MG tablet Take 1 tablet by mouth once daily 10/10/20   [name], MD  fluticasone [facility]) 50 MCG/ACT nasal spray Place 2 sprays into both nostrils daily. 08/10/20   Eulogio Bear, NP  furosemide (LASIX) 40 MG tablet  Take 1 tablet (40 mg total) by mouth 2 (two) times daily. 03/23/20 09/12/20  Werner Lean, MD  Guaifenesin 1200 MG TB12 Take by mouth. Mucinex 1200 mg as needed    [provider]  lisinopril (ZESTRIL) 40 MG tablet Take 1 tablet (40 mg total) by mouth daily. 03/06/20   [name], MD  loratadine (CLARITIN) 10 MG tablet Take 10 mg by mouth daily.    [provider]  naproxen (NAPROSYN) 500 MG tablet TAKE 1 TABLET BY MOUTH TWICE DAILY AS NEEDED FOR MILD PAIN 05/29/20   [name], MD  omeprazole (PRILOSEC) 40 MG capsule Take 1 capsule by mouth once daily 09/29/19   [name], MD  Polyethylene Glycol 3350 (MIRALAX PO) Take by mouth as needed.    [provider]  rosuvastatin (CRESTOR) 10 MG tablet Take 1 tablet (10 mg total) by mouth daily. 04/03/20   Rudean Haskell A, MD  tetrahydrozoline 0.05 % ophthalmic solution Place 1 drop into both eyes as needed (eye allergies).    [provider]     Positive ROS: Otherwise negative  All other systems have been reviewed and were otherwise negative with the exception of those mentioned in the HPI and as above.  Physical Exam: Constitutional: Alert, well-appearing, no acute distress Ears: External ears without lesions or tenderness. Ear canals are clear bilaterally.  Left TM is clear with good mobility on pneumatic otoscopy.  Right TM with a serous otitis media.  I was able to insufflate some air behind the right TM.  On tuning fork testing Weber lateralized to the right side.  On Rinne testing AC was equivocal to [facility] on the right side and AC > BC on the left side. Nasal: External nose without lesions. Septum with mild deformity and moderate rhinitis.  After decongesting the nose nasal endoscopy was performed.  Both middle meatus regions were clear with no signs of infection.  The nasopharynx was clear with no obstruction of the eustachian tube openings on either side. Oral: Lips and gums  without lesions. Tongue and palate mucosa without lesions. Posterior oropharynx clear. Neck: No palpable adenopathy or masses Respiratory: Breathing comfortably  Skin: No facial/neck lesions or rash noted.  Procedures  Assessment: Right serous otitis media with eustachian tube dysfunction.  Plan: Placed her on Nasacort spray 2 sprays each nostril at night and also Augmentin 875 mg twice daily for 10 days.  She will follow-up in 2 to 3 weeks for recheck.  Radene Journey, MD   CC:

## 2020-10-22 ENCOUNTER — Other Ambulatory Visit: Payer: Self-pay | Admitting: Family Medicine

## 2020-10-25 DIAGNOSIS — M5416 Radiculopathy, lumbar region: Secondary | ICD-10-CM | POA: Diagnosis not present

## 2020-11-07 ENCOUNTER — Other Ambulatory Visit: Payer: Self-pay

## 2020-11-07 ENCOUNTER — Ambulatory Visit (INDEPENDENT_AMBULATORY_CARE_PROVIDER_SITE_OTHER): Payer: Medicare HMO | Admitting: Otolaryngology

## 2020-11-07 DIAGNOSIS — H9071 Mixed conductive and sensorineural hearing loss, unilateral, right ear, with unrestricted hearing on the contralateral side: Secondary | ICD-10-CM | POA: Diagnosis not present

## 2020-11-07 DIAGNOSIS — H6981 Other specified disorders of Eustachian tube, right ear: Secondary | ICD-10-CM

## 2020-11-07 DIAGNOSIS — H6521 Chronic serous otitis media, right ear: Secondary | ICD-10-CM | POA: Diagnosis not present

## 2020-11-07 DIAGNOSIS — H90A22 Sensorineural hearing loss, unilateral, left ear, with restricted hearing on the contralateral side: Secondary | ICD-10-CM | POA: Diagnosis not present

## 2020-11-07 DIAGNOSIS — H90A31 Mixed conductive and sensorineural hearing loss, unilateral, right ear with restricted hearing on the contralateral side: Secondary | ICD-10-CM | POA: Diagnosis not present

## 2020-11-07 NOTE — Progress Notes (Signed)
HPI: [name] is a 75 y.o. female who returns today for evaluation of right ear hearing loss.  She was seen a little over a month ago because of a right serous otitis media with hearing loss and treated with Augmentin and nasal steroid spray.  She has been using the Flonase regularly but still has trouble opening or popping the right ear.  This has been going on now for about 2 months.  She does have history of allergies..  Past Medical History:  Diagnosis Date   Allergy    Arthritis    arthritis -knees   Colon polyps    benign- last colonoscopy 09-2008   Depression    GERD (gastroesophageal reflux disease)    hx GERD   H/O seasonal allergies    no problems now   Hyperlipidemia    Hypertension    Murmur, heart    Spinal stenosis of lumbar region at multiple levels    Past Surgical History:  Procedure Laterality Date   ABDOMINAL HYSTERECTOMY     APPENDECTOMY     with gallbladder surgery   BUNIONECTOMY Bilateral    CHOLECYSTECTOMY     open-gallstones   COLONOSCOPY     EXCISION OF ADNEXAL MASS Left August 03, 2013   at [facility]/right adnexal nodule,excision;benigh lymph node   OOPHORECTOMY Bilateral 2008   ovarian cyst   POLYPECTOMY     Social History   Socioeconomic History   Marital status: Married    Spouse name: Not on file   Number of children: Not on file   Years of education: Not on file   Highest education level: Not on file  Occupational History   Not on file  Tobacco Use   Smoking status: Never   Smokeless tobacco: Never  Substance and Sexual Activity   Alcohol use: No   Drug use: No   Sexual activity: Yes  Other Topics Concern   Not on file  Social History Narrative   Not on file   Social Determinants of Health   Financial Resource Strain: Low Risk    Difficulty of Paying Living Expenses: Not hard at all  Food Insecurity: No Food Insecurity   Worried About Charity fundraiser in the Last Year: Never true   [place] in the Last Year:  Never true  Transportation Needs: No Transportation Needs   Lack of Transportation (Medical): No   Lack of Transportation (Non-Medical): No  Physical Activity: Inactive   Days of Exercise per Week: 0 days   Minutes of Exercise per Session: 0 min  Stress: No Stress Concern Present   Feeling of Stress : Not at all  Social Connections: Moderately Isolated   Frequency of Communication with Friends and Family: More than three times a week   Frequency of Social Gatherings with Friends and Family: Three times a week   Attends Religious Services: More than 4 times per year   Active Member of Clubs or Organizations: No   Attends Archivist Meetings: Never   Marital Status: Widowed   Family History  Problem Relation Age of Onset   Colon cancer Mother 25   Heart attack Father    Breast cancer Paternal Aunt    Colon cancer Brother 22   Colon polyps Brother    Esophageal cancer Neg Hx    Pancreatic cancer Neg Hx    Rectal cancer Neg Hx    Stomach cancer Neg Hx    Allergies  Allergen Reactions  Aldactone [Spironolactone] Rash   Prior to Admission medications   Medication Sig Start Date End Date Taking? Authorizing Provider  albuterol (VENTOLIN HFA) 108 (90 Base) MCG/ACT inhaler Inhale 2 puffs into the lungs every 4 (four) hours as needed for wheezing or shortness of breath. 07/14/19   [name], MD  amoxicillin-clavulanate (AUGMENTIN) 875-125 MG tablet Take 1 tablet by mouth 2 (two) times daily. 10/18/20   Rozetta Nunnery, MD  Ascorbic Acid (VITAMIN C GUMMIES PO) Take by mouth. Natures Made Vit C gummies    [provider]  diltiazem (CARDIZEM CD) 360 MG 24 hr capsule Take 1 capsule by mouth once daily 09/03/20   [name], MD  diphenhydrAMINE (BENADRYL) 25 mg capsule Take 25 mg by mouth every 6 (six) hours as needed.    [provider]  doxazosin (CARDURA) 2 MG tablet Take 1 tablet by mouth once daily 10/10/20   [name], MD   fluticasone [facility]) 50 MCG/ACT nasal spray Place 2 sprays into both nostrils daily. 08/10/20   Eulogio Bear, NP  furosemide (LASIX) 40 MG tablet Take 1 tablet (40 mg total) by mouth 2 (two) times daily. 03/23/20 09/12/20  Werner Lean, MD  Guaifenesin 1200 MG TB12 Take by mouth. Mucinex 1200 mg as needed    [provider]  lisinopril (ZESTRIL) 40 MG tablet Take 1 tablet (40 mg total) by mouth daily. 03/06/20   [name], MD  loratadine (CLARITIN) 10 MG tablet Take 10 mg by mouth daily.    [provider]  naproxen (NAPROSYN) 500 MG tablet TAKE 1 TABLET BY MOUTH TWICE DAILY AS NEEDED FOR MILD PAIN 10/22/20   [name], MD  omeprazole (PRILOSEC) 40 MG capsule Take 1 capsule by mouth once daily 09/29/19   [name], MD  Polyethylene Glycol 3350 (MIRALAX PO) Take by mouth as needed.    [provider]  rosuvastatin (CRESTOR) 10 MG tablet Take 1 tablet (10 mg total) by mouth daily. 04/03/20   Rudean Haskell A, MD  tetrahydrozoline 0.05 % ophthalmic solution Place 1 drop into both eyes as needed (eye allergies).    [provider]  triamcinolone (NASACORT) 55 MCG/ACT AERO nasal inhaler Place 2 sprays into the nose daily. 2 sprays each nostril at night 10/18/20   Rozetta Nunnery, MD     Positive ROS: Otherwise negative  All other systems have been reviewed and were otherwise negative with the exception of those mentioned in the HPI and as above.  Physical Exam: Constitutional: Alert, well-appearing, no acute distress Ears: External ears without lesions or tenderness. Ear canals are clear bilaterally.  Left TM is clear.  Right TM is retracted with a right middle ear effusion.  I was able to insufflate some air behind the right TM in the office today with improvement of her hearing. Nasal: External nose without lesions. Septum with minimal deformity and mild rhinitis.  On nasal endoscopy the middle meatus regions were  clear bilaterally no evidence of active sinus infection.  The nasopharynx was clear.  Neither eustachian tube was obstructed..  Oral: Lips and gums without lesions. Tongue and palate mucosa without lesions. Posterior oropharynx clear. Neck: No palpable adenopathy or masses Respiratory: Breathing comfortably  Skin: No facial/neck lesions or rash noted.  Audiologic testing in the office today demonstrated a conductive hearing loss in the right ear of 10-25 DB with SRT in the right ear of 45 DB and SRT in the left ear  25 DB.  She had a type A tympanogram on the left side and a type B tympanogram on the right side.  Procedures  Assessment: Persistent right otitis media with effusion and conductive hearing loss.  Plan: Discussed with her concerning continued use of the Flonase 2 sprays each nostril once or twice a day.  Also discussed with her to try the "pop the ear" daily if possible although she has had trouble doing this.  If she is unable to open up the right ear in the next week she will follow-up for possible myringotomy and reviewed this with her today.  She states that she is only have problems now in the right ear for about 2-3 months.   Radene Journey, MD

## 2020-11-07 NOTE — Progress Notes (Signed)
Chronic Care Management Pharmacy Note  11/15/2020 Name:  [name] MRN:  [id number] DOB:  1945/09/06   Subjective: [name] is an 75 y.o. year old female who is a primary patient of Pickard, [name], MD.  The CCM team was consulted for assistance with disease management and care coordination needs.    Engaged with patient by telephone for initial visit in response to provider referral for pharmacy case management and/or care coordination services.   Consent to Services:  The patient was given the following information about Chronic Care Management services today, agreed to services, and gave verbal consent: 1. CCM service includes personalized support from designated clinical staff supervised by the primary care provider, including individualized plan of care and coordination with other care providers 2. 24/7 contact phone numbers for assistance for urgent and routine care needs. 3. Service will only be billed when office clinical staff spend 20 minutes or more in a month to coordinate care. 4. Only one practitioner may furnish and bill the service in a calendar month. 5.The patient may stop CCM services at any time (effective at the end of the month) by phone call to the office staff. 6. The patient will be responsible for cost sharing (co-pay) of up to 20% of the service fee (after annual deductible is met). Patient agreed to services and consent obtained.  Patient Care Team: [name], MD as PCP - General (Family Medicine) Werner Lean, MD as PCP - Cardiology (Cardiology) Edythe Clarity, [facility] as Pharmacist (Pharmacist)  Recent office visits:  05/31/20 Clinical Support Launa Grill, LPN. STOPPED/COMPLETED Hydrocodone and Prednisolone.   Recent consult visits:  04/03/20 Telephone Cardiology STARTED Rosuvastatin 10 mg daily. 03/26/20 [facility] Per note: Home visit. No information given. 03/06/20 Neurosurgery Ashok Pall  For spondylolisthesis. 03/23/20 Cardiology Werner Lean, MD. For Chronic heart failure. CHANGED Furosemide 40 mg to 2 times daily. STOPPED Spironolactone.    Objective:  Lab Results  Component Value Date   CREATININE 0.99 08/31/2020   BUN 12 08/31/2020   GFRNONAA 54 (L) 01/04/2020   GFRAA 62 01/04/2020   NA 143 08/31/2020   K 4.1 08/31/2020   CALCIUM 9.1 08/31/2020   CO2 32 08/31/2020   GLUCOSE 94 08/31/2020    No results found for: HGBA1C, FRUCTOSAMINE, GFR, MICROALBUR  Last diabetic Eye exam: No results found for: HMDIABEYEEXA  Last diabetic Foot exam: No results found for: HMDIABFOOTEX   Lab Results  Component Value Date   CHOL 127 08/31/2020   HDL 48 (L) 08/31/2020   LDLCALC 65 08/31/2020   TRIG 63 08/31/2020   CHOLHDL 2.6 08/31/2020    Hepatic Function Latest Ref Rng & Units 08/31/2020 07/12/2020 12/27/2019  Total Protein 6.1 - 8.1 g/dL 6.4 6.5 6.3  Albumin 3.7 - 4.7 g/dL - 4.5 4.1  AST 10 - 35 U/L $Remo'16 17 9  'hjxtl$ ALT 6 - 29 U/L $Remo'16 10 17  'ftLgf$ Alk Phosphatase 44 - 121 IU/L - 57 58  Total Bilirubin 0.2 - 1.2 mg/dL 0.3 0.4 0.3  Bilirubin, Direct 0.00 - 0.40 mg/dL - 0.13 -    Lab Results  Component Value Date/Time   TSH 2.020 12/27/2019 09:54 AM   TSH 1.88 10/29/2015 09:02 AM    CBC Latest Ref Rng & Units 08/31/2020 12/27/2019 11/07/2019  WBC 3.8 - 10.8 Thousand/uL 7.4 7.0 8.2  Hemoglobin 11.7 - 15.5 g/dL 12.5 13.3 14.3  Hematocrit 35.0 - 45.0 % 38.6 38.9  42.2  Platelets 140 - 400 Thousand/uL 222 202 267    No results found for: VD25OH  Clinical ASCVD: No  The ASCVD Risk score (Arnett DK, et al., 2019) failed to calculate for the following reasons:   The valid total cholesterol range is 130 to 320 mg/dL    Depression screen [facility] 2/9 09/04/2020 08/10/2020 05/31/2020  Decreased Interest 1 0 3  Down, Depressed, Hopeless 2 0 1  PHQ - 2 Score 3 0 4  Altered sleeping 2 - 3  Tired, decreased energy 3 - 0  Change in appetite 3 - 3  Feeling bad or failure about  yourself  2 - 0  Trouble concentrating 1 - 0  Moving slowly or fidgety/restless 0 - 0  Suicidal thoughts 0 - 0  PHQ-9 Score 14 - 10  Difficult doing work/chores - - Not difficult at all     Social History   Tobacco Use  Smoking Status Never  Smokeless Tobacco Never   BP Readings from Last 3 Encounters:  09/12/20 140/72  09/04/20 134/82  08/10/20 138/78   Pulse Readings from Last 3 Encounters:  09/12/20 71  09/04/20 90  08/10/20 74   Wt Readings from Last 3 Encounters:  09/12/20 255 lb (115.7 kg)  09/04/20 256 lb (116.1 kg)  08/10/20 257 lb 3.2 oz (116.7 kg)   BMI Readings from Last 3 Encounters:  09/12/20 43.77 kg/m  09/04/20 43.94 kg/m  08/10/20 44.15 kg/m    Assessment/Interventions: Review of patient past medical history, allergies, medications, health status, including review of consultants reports, laboratory and other test data, was performed as part of comprehensive evaluation and provision of chronic care management services.   SDOH:  (Social Determinants of Health) assessments and interventions performed: Yes  Financial Resource Strain: Low Risk    Difficulty of Paying Living Expenses: Not hard at all    SDOH Screenings   Alcohol Screen: Low Risk    Last Alcohol Screening Score (AUDIT): 0  Depression (PHQ2-9): Medium Risk   PHQ-2 Score: 14  Financial Resource Strain: Low Risk    Difficulty of Paying Living Expenses: Not hard at all  Food Insecurity: No Food Insecurity   Worried About Charity fundraiser in the Last Year: Never true   Ran Out of Food in the Last Year: Never true  Housing: Low Risk    Last Housing Risk Score: 0  Physical Activity: Inactive   Days of Exercise per Week: 0 days   Minutes of Exercise per Session: 0 min  Social Connections: Moderately Isolated   Frequency of Communication with Friends and Family: More than three times a week   Frequency of Social Gatherings with Friends and Family: Three times a week   Attends  Religious Services: More than 4 times per year   Active Member of Clubs or Organizations: No   Attends Archivist Meetings: Never   Marital Status: Widowed  Stress: No Stress Concern Present   Feeling of Stress : Not at all  Tobacco Use: Low Risk    Smoking Tobacco Use: Never   Smokeless Tobacco Use: Never   Passive Exposure: Not on file  Transportation Needs: No Transportation Needs   Lack of Transportation (Medical): No   Lack of Transportation (Non-Medical): No    CCM Care Plan  Allergies  Allergen Reactions   Aldactone [Spironolactone] Rash    Medications Reviewed Today     Reviewed by Edythe Clarity, [facility] (Pharmacist) on 11/15/20 at 1417  Med  List Status: <None>   Medication Order Taking? Sig Documenting Provider Last Dose Status Informant  0.9 %  sodium chloride infusion [id number]   Milus Banister, MD  Active   albuterol (VENTOLIN HFA) 108 (90 Base) MCG/ACT inhaler [id number] Yes Inhale 2 puffs into the lungs every 4 (four) hours as needed for wheezing or shortness of breath. [name], MD Taking Active   amoxicillin-clavulanate (AUGMENTIN) 875-125 MG tablet [id number] Yes Take 1 tablet by mouth 2 (two) times daily. Rozetta Nunnery, MD Taking Active   Ascorbic Acid (VITAMIN C GUMMIES PO) [id number] Yes Take by mouth. Natures Made Vit C gummies [provider] Taking Active   diltiazem (CARDIZEM CD) 360 MG 24 hr capsule [id number] Yes Take 1 capsule by mouth once daily [name], MD Taking Active   diphenhydrAMINE (BENADRYL) 25 mg capsule [id number] Yes Take 25 mg by mouth every 6 (six) hours as needed. [provider] Taking Active   doxazosin (CARDURA) 2 MG tablet [id number] Yes Take 1 tablet by mouth once daily [name], MD Taking Active   fluticasone [facility]) 50 MCG/ACT nasal spray [id number] Yes Place 2 sprays into both nostrils daily. Eulogio Bear, NP Taking Active   furosemide (LASIX) 40 MG tablet  [id number]  Take 1 tablet (40 mg total) by mouth 2 (two) times daily. Werner Lean, MD  Expired 09/12/20 2359   Guaifenesin 1200 MG TB12 [id number] Yes Take by mouth. Mucinex 1200 mg as needed [provider] Taking Active   lisinopril (ZESTRIL) 40 MG tablet [id number] Yes Take 1 tablet (40 mg total) by mouth daily. [name], MD Taking Active   loratadine (CLARITIN) 10 MG tablet [id number] Yes Take 10 mg by mouth daily. [provider] Taking Active   naproxen (NAPROSYN) 500 MG tablet [id number] Yes TAKE 1 TABLET BY MOUTH TWICE DAILY AS NEEDED FOR MILD PAIN [name], MD Taking Active   omeprazole (PRILOSEC) 40 MG capsule [id number] Yes Take 1 capsule by mouth once daily [name], MD Taking Active   Polyethylene Glycol 3350 (MIRALAX PO) [id number] Yes Take by mouth as needed. [provider] Taking Active   rosuvastatin (CRESTOR) 10 MG tablet [id number] Yes Take 1 tablet (10 mg total) by mouth daily. Werner Lean, MD Taking Active   tetrahydrozoline 0.05 % ophthalmic solution [id number] Yes Place 1 drop into both eyes as needed (eye allergies). [provider] Taking Active   triamcinolone (NASACORT) 55 MCG/ACT AERO nasal inhaler [id number] Yes Place 2 sprays into the nose daily. 2 sprays each nostril at night Rozetta Nunnery, MD Taking Active             Patient Active Problem List   Diagnosis Date Noted   Morbid obesity ([place]) 03/23/2020   Heart failure with preserved ejection fraction ([place]) 12/27/2019   Spinal stenosis of lumbar region at multiple levels    Arthropathia 09/23/2013   Benign neoplasm of colon 09/23/2013   Essential (primary) hypertension 09/23/2013   SBO (small bowel obstruction) ([place]) 08/17/2013   Encounter for postoperative care 08/03/2013   Pelvic mass 07/05/2013   Pain in joint, pelvic region and thigh 07/05/2013   Hyperlipidemia    Arthritis    Colon polyps     Immunization History   Administered Date(s) Administered   Fluad Quad(high Dose 65+) 10/06/2018, 11/07/2019, 11/14/2020   Influenza, High Dose Seasonal PF 12/03/2017   Influenza,inj,Quad PF,6+ Mos 11/06/2015, 11/03/2016   PFIZER(Purple Top)SARS-COV-2 Vaccination 02/15/2019, 03/07/2019, 12/05/2019  Pneumococcal Conjugate-13 10/20/2014   Pneumococcal Polysaccharide-23 06/17/2012    Conditions to be addressed/monitored:  HTN, HFpEF, Arthritis, HLD  Care Plan : General Pharmacy (Adult)  Updates made by Edythe Clarity, RPH since 11/15/2020 12:00 AM     Problem: HTN, HFpEF, Arthritis, HLD   Priority: High  Onset Date: 07/09/2020     Long-Range Goal: Patient-Specific Goal   Start Date: 07/09/2020  Expected End Date: 01/09/2021  Recent Progress: On track  Priority: High  Note:   Current Barriers:  None noted at this time  Pharmacist Clinical Goal(s):  Patient will achieve adherence to monitoring guidelines and medication adherence to achieve therapeutic efficacy contact provider office for questions/concerns as evidenced notation of same in electronic health record through collaboration with PharmD and provider.   Interventions: 1:1 collaboration with [name], MD regarding development and update of comprehensive plan of care as evidenced by provider attestation and co-signature Inter-disciplinary care team collaboration (see longitudinal plan of care) Comprehensive medication review performed; medication list updated in electronic medical record  Hypertension (BP goal <140/90) -Controlled -Current treatment: Lisinopril 40mg  daily Diltiazem CD 360mg  24hr daily Doxazosin 2mg  daily -Medications previously tried: HCTZ  -Current home readings: 127/80 -Current exercise habits: minimal -Denies hypotensive/hypertensive symptoms -Educated on BP goals and benefits of medications for prevention of heart attack, stroke and kidney damage; Exercise goal of 150 minutes per week; Importance of  home blood pressure monitoring; Symptoms of hypotension and importance of maintaining adequate hydration; -Counseled to monitor BP at home a few times per week, document, and provide log at future appointments -Counseled on diet and exercise extensively Recommended to continue current medication Recommended she participate in water aerobics or water exercise of some kind using silver sneakers or at the Aquatic center.  Update 11/15/20 135/88 BP today she reports it has been controlled around this area for the most part at home.  Denies any symptoms. She has not started her aerobics at the aquatic center but still plans to do so.   Continue current meds for now, reinforced importance of physical activity and effect on BP.  Continue current meds  Hyperlipidemia: (LDL goal < 100) -Uncontrolled -Current treatment: Rosuvastatin 10mg  daily -Medications previously tried: none noted   -Educated on Cholesterol goals;  Benefits of statin for ASCVD risk reduction; Importance of limiting foods high in cholesterol; Strategies to manage statin-induced myalgias; -Started statin after most recent lipid panel -She is tolerating it well Has appointment on the 16th to recheck lipids -Counseled on diet and exercise extensively Recommended to continue current medication Will follow up on lipids next month to check efficacy.  Update 11/15/20 Most recent lipid panel from August shows improved lipids!  LDL down to 65 from a previous result of 119.  Congratulated patient in her efforts.  Continues to be adherent with medication and denies any adverse effects as a result of taking this med.  Continue current meds for now.  Heart Failure (Goal: manage symptoms and prevent exacerbations) -Controlled -Last ejection fraction: 60-65% -Current treatment: Furosemide 40mg  daily -Medications previously tried: none noted  -Current home BP/HR readings: 128/70  -Educated on Benefits of medications for managing  symptoms and prolonging life Importance of weighing daily; if you gain more than 3 pounds in one day or 5 pounds in one week, contact providers Importance of blood pressure control Denies any recent swelling or SOB -Counseled on diet and exercise extensively Recommended to continue current medication  Arthritis (Goal: Minimize symptoms) -Not ideally controlled -Current treatment  Naproxen '500mg'$  prn -Medications previously tried: none noted -Still having some pain  -Counseled on diet and exercise extensively Recommended to continue current medication Recommended water exercise at aquatic center, patient interested to increase her activity level.  Patient Goals/Self-Care Activities Patient will:  - take medications as prescribed focus on medication adherence by fill dates check blood pressure a few times per week, document, and provide at future appointments Work to get physical activity at somewhere such as the aquatic center.  Follow Up Plan: The care management team will reach out to the patient again over the next 120 days.            Medication Assistance: None required.  Patient affirms current coverage meets needs.  Compliance/Adherence/Medication fill history: Care Gaps: Shingrix    Patient's preferred pharmacy is:  [place] ([place]), [place] - [address] [place]) [place] [address] Phone: [phone] Fax: [phone]   Pt endorses 100% compliance  We discussed: Benefits of medication synchronization, packaging and delivery as well as enhanced pharmacist oversight with [employer]. Patient decided to: Continue current medication management strategy  Care Plan and Follow Up Patient Decision:  Patient agrees to Care Plan and Follow-up.  Plan: The care management team will reach out to the patient again over the next 120 days.  Beverly Milch, PharmD Clinical Pharmacist [place] [phone]

## 2020-11-12 ENCOUNTER — Other Ambulatory Visit: Payer: Self-pay | Admitting: Family Medicine

## 2020-11-14 ENCOUNTER — Ambulatory Visit (INDEPENDENT_AMBULATORY_CARE_PROVIDER_SITE_OTHER): Payer: Medicare HMO | Admitting: *Deleted

## 2020-11-14 ENCOUNTER — Other Ambulatory Visit: Payer: Self-pay

## 2020-11-14 DIAGNOSIS — Z23 Encounter for immunization: Secondary | ICD-10-CM

## 2020-11-15 ENCOUNTER — Ambulatory Visit (INDEPENDENT_AMBULATORY_CARE_PROVIDER_SITE_OTHER): Payer: Medicare HMO | Admitting: Pharmacist

## 2020-11-15 DIAGNOSIS — E7800 Pure hypercholesterolemia, unspecified: Secondary | ICD-10-CM

## 2020-11-15 DIAGNOSIS — I10 Essential (primary) hypertension: Secondary | ICD-10-CM

## 2020-11-15 NOTE — Patient Instructions (Addendum)
Visit Information   Goals Addressed             This Visit's Progress    Track and Manage My Blood Pressure-Hypertension   On track    Timeframe:  Long-Range Goal Priority:  High Start Date:   07/10/20                          Expected End Date: 01/09/21                      Follow Up Date 10/26/20    - check blood pressure 3 times per week - choose a place to take my blood pressure (home, clinic or office, retail store) - write blood pressure results in a log or diary    Why is this important?   You won't feel high blood pressure, but it can still hurt your blood vessels.  High blood pressure can cause heart or kidney problems. It can also cause a stroke.  Making lifestyle changes like losing a little weight or eating less salt will help.  Checking your blood pressure at home and at different times of the day can help to control blood pressure.  If the doctor prescribes medicine remember to take it the way the doctor ordered.  Call the office if you cannot afford the medicine or if there are questions about it.     Notes:        Patient Care Plan: General Pharmacy (Adult)     Problem Identified: HTN, HFpEF, Arthritis, HLD   Priority: High  Onset Date: 07/09/2020     Long-Range Goal: Patient-Specific Goal   Start Date: 07/09/2020  Expected End Date: 01/09/2021  Recent Progress: On track  Priority: High  Note:   Current Barriers:  None noted at this time  Pharmacist Clinical Goal(s):  Patient will achieve adherence to monitoring guidelines and medication adherence to achieve therapeutic efficacy contact provider office for questions/concerns as evidenced notation of same in electronic health record through collaboration with PharmD and provider.   Interventions: 1:1 collaboration with [name], MD regarding development and update of comprehensive plan of care as evidenced by provider attestation and co-signature Inter-disciplinary care team collaboration  (see longitudinal plan of care) Comprehensive medication review performed; medication list updated in electronic medical record  Hypertension (BP goal <140/90) -Controlled -Current treatment: Lisinopril 40mg  daily Diltiazem CD 360mg  24hr daily Doxazosin 2mg  daily -Medications previously tried: HCTZ  -Current home readings: 127/80 -Current exercise habits: minimal -Denies hypotensive/hypertensive symptoms -Educated on BP goals and benefits of medications for prevention of heart attack, stroke and kidney damage; Exercise goal of 150 minutes per week; Importance of home blood pressure monitoring; Symptoms of hypotension and importance of maintaining adequate hydration; -Counseled to monitor BP at home a few times per week, document, and provide log at future appointments -Counseled on diet and exercise extensively Recommended to continue current medication Recommended she participate in water aerobics or water exercise of some kind using silver sneakers or at the Aquatic center.  Update 11/15/20 135/88 BP today she reports it has been controlled around this area for the most part at home.  Denies any symptoms. She has not started her aerobics at the aquatic center but still plans to do so.   Continue current meds for now, reinforced importance of physical activity and effect on BP.  Continue current meds  Hyperlipidemia: (LDL goal < 100) -Uncontrolled -Current treatment: Rosuvastatin 10mg   daily -Medications previously tried: none noted   -Educated on Cholesterol goals;  Benefits of statin for ASCVD risk reduction; Importance of limiting foods high in cholesterol; Strategies to manage statin-induced myalgias; -Started statin after most recent lipid panel -She is tolerating it well Has appointment on the 16th to recheck lipids -Counseled on diet and exercise extensively Recommended to continue current medication Will follow up on lipids next month to check efficacy.  Update  11/15/20 Most recent lipid panel from August shows improved lipids!  LDL down to 65 from a previous result of 119.  Congratulated patient in her efforts.  Continues to be adherent with medication and denies any adverse effects as a result of taking this med.  Continue current meds for now.  Heart Failure (Goal: manage symptoms and prevent exacerbations) -Controlled -Last ejection fraction: 60-65% -Current treatment: Furosemide 40mg  daily -Medications previously tried: none noted  -Current home BP/HR readings: 128/70  -Educated on Benefits of medications for managing symptoms and prolonging life Importance of weighing daily; if you gain more than 3 pounds in one day or 5 pounds in one week, contact providers Importance of blood pressure control Denies any recent swelling or SOB -Counseled on diet and exercise extensively Recommended to continue current medication  Arthritis (Goal: Minimize symptoms) -Not ideally controlled -Current treatment  Naproxen 500mg  prn -Medications previously tried: none noted -Still having some pain  -Counseled on diet and exercise extensively Recommended to continue current medication Recommended water exercise at aquatic center, patient interested to increase her activity level.  Patient Goals/Self-Care Activities Patient will:  - take medications as prescribed focus on medication adherence by fill dates check blood pressure a few times per week, document, and provide at future appointments Work to get physical activity at somewhere such as the aquatic center.  Follow Up Plan: The care management team will reach out to the patient again over the next 120 days.           Patient verbalizes understanding of instructions provided today and agrees to view in [place].  Telephone follow up appointment with pharmacy team member scheduled for: 6 months  Edythe Clarity, [place]

## 2020-11-26 DIAGNOSIS — E7800 Pure hypercholesterolemia, unspecified: Secondary | ICD-10-CM | POA: Diagnosis not present

## 2020-11-26 DIAGNOSIS — I10 Essential (primary) hypertension: Secondary | ICD-10-CM | POA: Diagnosis not present

## 2020-11-29 DIAGNOSIS — Z6841 Body Mass Index (BMI) 40.0 and over, adult: Secondary | ICD-10-CM | POA: Diagnosis not present

## 2020-11-29 DIAGNOSIS — Z90711 Acquired absence of uterus with remaining cervical stump: Secondary | ICD-10-CM | POA: Diagnosis not present

## 2020-11-29 DIAGNOSIS — Z800 Family history of malignant neoplasm of digestive organs: Secondary | ICD-10-CM | POA: Diagnosis not present

## 2020-11-29 DIAGNOSIS — Z90722 Acquired absence of ovaries, bilateral: Secondary | ICD-10-CM | POA: Diagnosis not present

## 2020-11-29 DIAGNOSIS — R1031 Right lower quadrant pain: Secondary | ICD-10-CM | POA: Diagnosis not present

## 2020-11-29 DIAGNOSIS — Z01419 Encounter for gynecological examination (general) (routine) without abnormal findings: Secondary | ICD-10-CM | POA: Diagnosis not present

## 2020-11-29 DIAGNOSIS — Z01411 Encounter for gynecological examination (general) (routine) with abnormal findings: Secondary | ICD-10-CM | POA: Diagnosis not present

## 2020-11-29 DIAGNOSIS — R102 Pelvic and perineal pain: Secondary | ICD-10-CM | POA: Diagnosis not present

## 2020-11-29 DIAGNOSIS — Z124 Encounter for screening for malignant neoplasm of cervix: Secondary | ICD-10-CM | POA: Diagnosis not present

## 2020-12-03 ENCOUNTER — Other Ambulatory Visit: Payer: Self-pay | Admitting: Family Medicine

## 2020-12-04 DIAGNOSIS — M5416 Radiculopathy, lumbar region: Secondary | ICD-10-CM | POA: Diagnosis not present

## 2020-12-04 DIAGNOSIS — M48061 Spinal stenosis, lumbar region without neurogenic claudication: Secondary | ICD-10-CM | POA: Diagnosis not present

## 2020-12-08 ENCOUNTER — Other Ambulatory Visit: Payer: Self-pay | Admitting: Family Medicine

## 2020-12-11 DIAGNOSIS — Z23 Encounter for immunization: Secondary | ICD-10-CM | POA: Diagnosis not present

## 2020-12-17 ENCOUNTER — Other Ambulatory Visit: Payer: Self-pay | Admitting: Family Medicine

## 2020-12-27 ENCOUNTER — Other Ambulatory Visit: Payer: Self-pay

## 2020-12-27 DIAGNOSIS — K5792 Diverticulitis of intestine, part unspecified, without perforation or abscess without bleeding: Secondary | ICD-10-CM | POA: Insufficient documentation

## 2020-12-27 DIAGNOSIS — Z90710 Acquired absence of both cervix and uterus: Secondary | ICD-10-CM | POA: Insufficient documentation

## 2020-12-31 ENCOUNTER — Ambulatory Visit: Payer: Medicare HMO | Admitting: Nurse Practitioner

## 2020-12-31 ENCOUNTER — Encounter: Payer: Self-pay | Admitting: Nurse Practitioner

## 2020-12-31 ENCOUNTER — Other Ambulatory Visit (INDEPENDENT_AMBULATORY_CARE_PROVIDER_SITE_OTHER): Payer: Medicare HMO

## 2020-12-31 VITALS — BP 120/70 | HR 72 | Ht 64.0 in | Wt 257.0 lb

## 2020-12-31 DIAGNOSIS — K5900 Constipation, unspecified: Secondary | ICD-10-CM

## 2020-12-31 DIAGNOSIS — R1031 Right lower quadrant pain: Secondary | ICD-10-CM

## 2020-12-31 LAB — CBC WITH DIFFERENTIAL/PLATELET
Basophils Absolute: 0.1 10*3/uL (ref 0.0–0.1)
Basophils Relative: 0.6 % (ref 0.0–3.0)
Eosinophils Absolute: 0.3 10*3/uL (ref 0.0–0.7)
Eosinophils Relative: 3.9 % (ref 0.0–5.0)
HCT: 39.3 % (ref 36.0–46.0)
Hemoglobin: 12.8 g/dL (ref 12.0–15.0)
Lymphocytes Relative: 17.9 % (ref 12.0–46.0)
Lymphs Abs: 1.5 10*3/uL (ref 0.7–4.0)
MCHC: 32.5 g/dL (ref 30.0–36.0)
MCV: 89 fl (ref 78.0–100.0)
Monocytes Absolute: 0.6 10*3/uL (ref 0.1–1.0)
Monocytes Relative: 7.5 % (ref 3.0–12.0)
Neutro Abs: 5.8 10*3/uL (ref 1.4–7.7)
Neutrophils Relative %: 70.1 % (ref 43.0–77.0)
Platelets: 209 10*3/uL (ref 150.0–400.0)
RBC: 4.42 Mil/uL (ref 3.87–5.11)
RDW: 14 % (ref 11.5–15.5)
WBC: 8.3 10*3/uL (ref 4.0–10.5)

## 2020-12-31 LAB — COMPREHENSIVE METABOLIC PANEL
ALT: 14 U/L (ref 0–35)
AST: 17 U/L (ref 0–37)
Albumin: 4.3 g/dL (ref 3.5–5.2)
Alkaline Phosphatase: 41 U/L (ref 39–117)
BUN: 11 mg/dL (ref 6–23)
CO2: 34 mEq/L — ABNORMAL HIGH (ref 19–32)
Calcium: 9.3 mg/dL (ref 8.4–10.5)
Chloride: 100 mEq/L (ref 96–112)
Creatinine, Ser: 1.03 mg/dL (ref 0.40–1.20)
GFR: 53.92 mL/min — ABNORMAL LOW (ref >60.00)
Glucose, Bld: 85 mg/dL (ref 70–99)
Potassium: 3.4 mEq/L — ABNORMAL LOW (ref 3.5–5.1)
Sodium: 144 mEq/L (ref 135–145)
Total Bilirubin: 0.5 mg/dL (ref 0.2–1.2)
Total Protein: 7 g/dL (ref 6.0–8.3)

## 2020-12-31 LAB — C-REACTIVE PROTEIN: CRP: <1 mg/dL (ref 0.5–20.0)

## 2020-12-31 NOTE — Progress Notes (Signed)
12/31/2020 [name] [id number] [birth year]   Chief Complaint: RLQ abdominal pain, mucous with BMs  History of Present Illness: [name]. Leder is a 75 year old female with a past medical history of arthritis, spinal stenosis, depression, hypertension, hyperlipidemia, GERD and colon polyps.  She presents to our office today for further evaluation regarding constipation with passing nonbloody mucus per the rectum for 3 weeks with intermittent RLQ pain.  She describes her RLQ pain as intermittent, last for 30 minutes then goes away and does not change after passing a bowel movement.  She also feels swelling to this area as well.  No rectal bleeding or melena.  She describes feeling as if there is a blockage in her colon, feels as if food gets hung up for a while before it passes.  No nausea or vomiting.  She had mild heartburn last week for which she took Pepto-Bismol x1 dose without recurrence.  No dysphagia.  No upper abdominal pain.  She takes Naproxen 2 tabs daily for back and knee pain.  Her mother and brother had colon cancer.  She stated her brother was diagnosed with colon cancer 1 or 2 years after he had routine colonoscopy which was apparently normal.  She questions if she should have a repeat colonoscopy at this time.  Her most recent colonoscopy was 12/05/2018 by Dr. Ardis Hughs which identified one 3 mm polyp which was removed from the descending colon, biopsy showed benign lymphoid aggregate without adenomatous changes.  Prior history of hyperplastic, tubular adenomatous and sessile serrated polyps, see past colonoscopy reports below.  No fever, sweats or chills.  No weight loss.  Colonoscopy 12/05/2018: - One 3 mm polyp in the descending colon, removed with a cold snare. Resected and retrieved. - Diverticulosis in the left colon. - The examination was otherwise normal on direct and retroflexion views. - COLONIC MUCOSA WITH BENIGN LYMPHOID AGGREGATE. - NO ADENOMATOUS CHANGE OR  MALIGNANCY. The quality of the bowel preparation was good.  Colonoscopy 12/19/2013: 1. Two sessile polyps ranging between 3-46mm in size were found in the transverse colon; polypectomies were performed with a cold snare 2. Mild diverticulosis was noted in the left colon 3. The examination was otherwise normal - TUBULAR ADENOMA (X 1). - SESSILE SERRATED POLYP WITHOUT CYTOLOGIC DYSPLASIA (X 1). - NO HIGH GRADE DYSPLASIA OR MALIGNANCY IDENTIFIED  Colonoscopy July 2005, Dr. Acquanetta Sit, done for screening.  3 subcentimeter polyps were removed, these were adenomatous on biopsy. Also left-sided diverticulosis was noted Colonoscopy pathology report from 2010 showed one hyperplastic polyp and one tubular adenoma.   CBC Latest Ref Rng & Units 08/31/2020 12/27/2019 11/07/2019  WBC 3.8 - 10.8 Thousand/uL 7.4 7.0 8.2  Hemoglobin 11.7 - 15.5 g/dL 12.5 13.3 14.3  Hematocrit 35.0 - 45.0 % 38.6 38.9 42.2  Platelets 140 - 400 Thousand/uL 222 202 267    CMP Latest Ref Rng & Units 08/31/2020 07/12/2020 04/02/2020  Glucose 65 - 99 mg/dL 94 - 100(H)  BUN 7 - 25 mg/dL 12 - 11  Creatinine 0.60 - 1.00 mg/dL 0.99 - 1.04(H)  Sodium 135 - 146 mmol/L 143 - 142  Potassium 3.5 - 5.3 mmol/L 4.1 - 3.5  Chloride 98 - 110 mmol/L 103 - 101  CO2 20 - 32 mmol/L 32 - 26  Calcium 8.6 - 10.4 mg/dL 9.1 - 8.8  Total Protein 6.1 - 8.1 g/dL 6.4 6.5 -  Total Bilirubin 0.2 - 1.2 mg/dL 0.3 0.4 -  Alkaline Phos 44 - 121  IU/L - 57 -  AST 10 - 35 U/L 16 17 -  ALT 6 - 29 U/L 16 10 -    Current Outpatient Medications on File Prior to Visit  Medication Sig Dispense Refill   albuterol (VENTOLIN HFA) 108 (90 Base) MCG/ACT inhaler Inhale 2 puffs into the lungs every 4 (four) hours as needed for wheezing or shortness of breath. 16 g 5   Ascorbic Acid (VITAMIN C GUMMIES PO) Take by mouth. Natures Made Vit C gummies     diltiazem (CARDIZEM CD) 360 MG 24 hr capsule Take 1 capsule by mouth once daily 90 capsule 0   doxazosin (CARDURA) 2 MG  tablet Take 1 tablet by mouth once daily 30 tablet 0   fluticasone (FLONASE) 50 MCG/ACT nasal spray Place 2 sprays into both nostrils daily. 16 g 6   furosemide (LASIX) 40 MG tablet Take 1 tablet (40 mg total) by mouth 2 (two) times daily. 180 tablet 3   lisinopril (ZESTRIL) 40 MG tablet Take 1 tablet (40 mg total) by mouth daily. 90 tablet 3   omeprazole (PRILOSEC) 40 MG capsule Take 1 capsule by mouth once daily 30 capsule 0   Polyethylene Glycol 3350 (MIRALAX PO) Take by mouth as needed.     rosuvastatin (CRESTOR) 10 MG tablet Take 1 tablet (10 mg total) by mouth daily. 90 tablet 3   tetrahydrozoline 0.05 % ophthalmic solution Place 1 drop into both eyes as needed (eye allergies).     triamcinolone (NASACORT) 55 MCG/ACT AERO nasal inhaler Place 2 sprays into the nose daily. 2 sprays each nostril at night 1 each 12   Current Facility-Administered Medications on File Prior to Visit  Medication Dose Route Frequency Provider Last Rate Last Admin   0.9 %  sodium chloride infusion  500 mL Intravenous Continuous Milus Banister, MD       Allergies  Allergen Reactions   Aldactone [Spironolactone] Rash   Current Medications, Allergies, Past Medical History, Past Surgical History, Family History and Social History were reviewed in [facility] record.  Review of Systems:   Constitutional: Negative for fever, sweats, chills or weight loss.  Respiratory: Negative for shortness of breath.   Cardiovascular: Negative for chest pain, palpitations and leg swelling.  Gastrointestinal: See HPI.  Musculoskeletal: Negative for back pain or muscle aches.  Neurological: Negative for dizziness, headaches or paresthesias.   Physical Exam: BP 120/70   Pulse 72   Ht 5\' 4"  (1.626 m)   Wt 257 lb (116.6 kg)   SpO2 98%   BMI 44.11 kg/m  General: 75 year old female in no acute distress. Head: Normocephalic and atraumatic. Eyes: No scleral icterus. Conjunctiva pink . Ears: Normal  auditory acuity. Mouth: Dentition intact. No ulcers or lesions.  Lungs: Clear throughout to auscultation. Heart: Regular rate and rhythm. Systolic murmur.  Abdomen: Soft, nondistended.  Tenderness to the RLQ with palpable thickness with associated tenderness to the central mid abdomen without rebound or guarding.  No discrete mass palpated.  No masses or hepatomegaly. Normal bowel sounds x 4 quadrants.  Rectal: Deferred. Musculoskeletal: Symmetrical with no gross deformities. Extremities: No edema. Neurological: Alert oriented x 4. No focal deficits.  Psychological: Alert and cooperative. Normal mood and affect  Assessment and Recommendations:  18) 75 year old female with constipation and nonbloody mucus per the rectum x 3 weeks -Miralax Q HS as tolerated  -Probiotic of choice Q day -Increase dietary fiber, drink 64 ounces of water daily  2) RLQ pain, likely due to  constipation -CBC, CMP and CRP -CTAP with oral and IV contrast, BUN/creat level to be reviewed prior to the patient receiving IV contrast  3)  History of hyperplastic, sessile serrated and tubular adenomatous polyps and left colon diverticulosis. Her most recent colonoscopy 11/2018 identified 1 colon polyp but biopsy showed benign lymphoid tissue. Significant family history of colon cancer (mother and brother).  Patient reported her brother had a normal colonoscopy then 1 or 2 years later he was diagnosed with colon cancer. She questions if she should have another colonoscopy at this time.  I attempted to provide her with reassurance and discussed she has had multiple colon polyp surveillance colonoscopies with the most recent colonoscopy being 2 years ago without evidence of a true colon polyp, therefore it would be extremely unlikely that she has developed a colon cancer at this time. Due to her RLQ pain as noted above, I ordered a CTAP to rule out any abd/pelvic inflammatory/infectious process or concerning mass. -Further  recommendations to be determined after the above lab and CT results reviewed -Patient will contact our office if her symptoms worsen

## 2020-12-31 NOTE — Patient Instructions (Addendum)
LABS:  Lab work has been ordered for you today. Our lab is located in the basement. Press "B" on the elevator. The lab is located at the first door on the left as you exit the elevator.  RECOMMENDATIONS: Miralax- Dissolve one capful in 8 ounces of water and drink before bed. Take a probiotic of your choice once a day. Further recommendations to be determined after CT scan and lab results are reviewed.  IMAGING: You will be contacted by [facility] Scheduling (Your caller ID will indicate phone # [phone]) in the next 7 days to schedule your Abdominal CT Scan. If you have not heard from them within 7 business days, please call [employer] at [phone] to follow up on the status of your appointment.   You should arrive 15 minutes prior to your appointment time for registration. Please follow the written instructions below on the day of your exam:  WARNING: IF YOU ARE ALLERGIC TO IODINE/X-RAY DYE, PLEASE NOTIFY RADIOLOGY IMMEDIATELY AT [phone]! YOU WILL BE GIVEN A 13 HOUR PREMEDICATION PREP.  1) Do not eat or drink anything 4 hours prior to your test 2) You have been given 2 bottles of oral contrast to drink. The solution may taste better if refrigerated, but do NOT add ice or any other liquid to this solution. Shake well before drinking.    Drink 1 bottle of contrast 2 hours prior to your exam  Drink 1 bottle of contrast 1 hour prior to your exam  You may take any medications as prescribed with a small amount of water, if necessary. If you take any of the following medications: METFORMIN, GLUCOPHAGE, GLUCOVANCE, AVANDAMET, RIOMET, FORTAMET, [place] MET, JANUMET, GLUMETZA or METAGLIP, you MAY be asked to HOLD this medication 48 hours AFTER the exam.  The purpose of you drinking the oral contrast is to aid in the visualization of your intestinal tract. The contrast solution may cause some diarrhea. Depending on your individual set of symptoms, you may also  receive an intravenous injection of x-ray contrast/dye. Plan on being at [facility] for 30 minutes or longer, depending on the type of exam you are having performed.  This test typically takes 30-45 minutes to complete. ______________________________________________________ It was great seeing you today! Thank you for entrusting me with your care and choosing [facility].  Noralyn Pick, CRNP  If you are age 23 or older, your body mass index should be between 23-30. Your Body mass index is 44.11 kg/m. If this is out of the aforementioned range listed, please consider follow up with your Primary Care Provider.  If you are age 68 or younger, your body mass index should be between 19-25. Your Body mass index is 44.11 kg/m. If this is out of the aformentioned range listed, please consider follow up with your Primary Care Provider.  The [place] [facility] providers would like to encourage you to use [facility] to communicate with providers for non-urgent requests or questions.  Due to long hold times on the telephone, sending your provider a message by [facility] may be faster and more efficient way to get a response. Please allow 48 business hours for a response.  Please remember that this is for non-urgent requests/questions.

## 2021-01-01 NOTE — Progress Notes (Signed)
I agree with the above note, plan 

## 2021-01-02 ENCOUNTER — Telehealth: Payer: Self-pay

## 2021-01-02 NOTE — Telephone Encounter (Signed)
Notified patient of test results and recommendations per [facility], patient expressed understanding and agreement, no further questions at this time.

## 2021-01-02 NOTE — Telephone Encounter (Signed)
-----   Message from Noralyn Pick, NP sent at 01/02/2021 11:22 AM EST ----- [name], pls let the patient know her labs were ok, potassium was a little low so I would advise her to eat a banana for the next 3 to 4 days and to have a follow-up BMP in the next few weeks with her PCP who prescribes her Lasix.  Her BUN and creatinine level were within normal limits therefore she may proceed with CTAP with contrast as ordered thank you

## 2021-01-08 ENCOUNTER — Telehealth (HOSPITAL_COMMUNITY): Payer: Self-pay | Admitting: Nurse Practitioner

## 2021-01-08 NOTE — Telephone Encounter (Signed)
01/08/21~Patient c/b & confirmed appt. MF

## 2021-01-09 ENCOUNTER — Other Ambulatory Visit: Payer: Self-pay | Admitting: Podiatry

## 2021-01-09 ENCOUNTER — Encounter: Payer: Self-pay | Admitting: Podiatry

## 2021-01-09 ENCOUNTER — Other Ambulatory Visit: Payer: Self-pay

## 2021-01-09 ENCOUNTER — Ambulatory Visit: Payer: Medicare HMO | Admitting: Podiatry

## 2021-01-09 ENCOUNTER — Ambulatory Visit (INDEPENDENT_AMBULATORY_CARE_PROVIDER_SITE_OTHER): Payer: Medicare HMO

## 2021-01-09 DIAGNOSIS — M79672 Pain in left foot: Secondary | ICD-10-CM

## 2021-01-09 DIAGNOSIS — M79671 Pain in right foot: Secondary | ICD-10-CM

## 2021-01-09 DIAGNOSIS — M19072 Primary osteoarthritis, left ankle and foot: Secondary | ICD-10-CM | POA: Diagnosis not present

## 2021-01-09 DIAGNOSIS — M19079 Primary osteoarthritis, unspecified ankle and foot: Secondary | ICD-10-CM

## 2021-01-09 DIAGNOSIS — M19071 Primary osteoarthritis, right ankle and foot: Secondary | ICD-10-CM | POA: Diagnosis not present

## 2021-01-09 MED ORDER — MELOXICAM 15 MG PO TABS: 15.0000 mg | ORAL_TABLET | Freq: Every day | ORAL | 0 refills | Status: DC

## 2021-01-09 NOTE — Progress Notes (Signed)
°  Subjective:  Patient ID: [name], female    DOB: [birth year],   MRN: [id number]  Chief Complaint  Patient presents with   Pain    BL dorsal midfoot and toes (1-10) pain x couple mo - no injuyr - 9/10 sharp pains -Lt>Rt 0- w/ swelling -worse late evening tx: aspercreme    75 y.o. female presents for concer of dorsal midfoot pain that has been happening for about two months. Denies any injury. Relates pain at the end of the day when walking a lot. Has been using aspercreme and ibuprofen with some relief. History of bilateral bunionectomies. Denies any other pedal complaints. Denies n/v/f/c.   Past Medical History:  Diagnosis Date   Allergy    Arthritis    arthritis -knees   Colon polyps    benign- last colonoscopy 09-2008   Depression    GERD (gastroesophageal reflux disease)    hx GERD   H/O seasonal allergies    no problems now   Hyperlipidemia    Hypertension    Murmur, heart    Spinal stenosis of lumbar region at multiple levels     Objective:  Physical Exam: Vascular: DP/PT pulses 2/4 bilateral. CFT <3 seconds. Normal hair growth on digits. No edema.  Skin. No lacerations or abrasions bilateral feet.  Musculoskeletal: MMT 5/5 bilateral lower extremities in DF, PF, Inversion and Eversion. Deceased ROM in DF of ankle joint. Tender over the third tarsometatarsal joint bilateral. Pain with ROM of the midtarsal joints.  Neurological: Sensation intact to light touch.   Assessment:   1. Arthritis of midfoot      Plan:  Patient was evaluated and treated and all questions answered. Discussed midfoot arthritis with patient and treatment options.  X-rays reviewed. No acute fractures or dislocations. Degenerative changes noted throughout midfoot bilateral.  Retained hardware from previous bunionectomy bilateral.  Discussed NSAIDS, topicals, and possible injections.  Prescription for meloxicam provided. Discussed stiff soled shoes and carbon fiber foot plate.  Discussed if  pain does not improve can discuss surgical options or injection  Patient to follow-up as needed.    Lorenda Peck, DPM

## 2021-01-16 ENCOUNTER — Other Ambulatory Visit: Payer: Self-pay | Admitting: Family Medicine

## 2021-01-23 ENCOUNTER — Other Ambulatory Visit: Payer: Self-pay

## 2021-01-23 ENCOUNTER — Encounter (HOSPITAL_COMMUNITY): Payer: Self-pay

## 2021-01-23 ENCOUNTER — Ambulatory Visit (HOSPITAL_COMMUNITY)
Admission: RE | Admit: 2021-01-23 | Discharge: 2021-01-23 | Disposition: A | Discharge status: Home / Self Care | Payer: Medicare HMO | Source: Ambulatory Visit | Attending: Nurse Practitioner | Admitting: Nurse Practitioner

## 2021-01-23 DIAGNOSIS — R1031 Right lower quadrant pain: Secondary | ICD-10-CM | POA: Diagnosis not present

## 2021-01-23 DIAGNOSIS — K5900 Constipation, unspecified: Secondary | ICD-10-CM | POA: Diagnosis not present

## 2021-01-23 DIAGNOSIS — N200 Calculus of kidney: Secondary | ICD-10-CM | POA: Diagnosis not present

## 2021-01-23 MED ORDER — IOHEXOL 350 MG/ML SOLN
80.0000 mL | Freq: Once | INTRAVENOUS | Status: AC | PRN
  Administered 2021-01-23: 09:00:00 80 mL via INTRAVENOUS

## 2021-01-23 MED ORDER — SODIUM CHLORIDE (PF) 0.9 % IJ SOLN
INTRAMUSCULAR | Status: AC
  Filled 2021-01-23: qty 50

## 2021-01-29 DIAGNOSIS — M5416 Radiculopathy, lumbar region: Secondary | ICD-10-CM | POA: Diagnosis not present

## 2021-02-05 ENCOUNTER — Telehealth: Payer: Self-pay | Admitting: Pharmacist

## 2021-02-05 NOTE — Chronic Care Management (AMB) (Signed)
Chronic Care Management Pharmacy Assistant   Name: [name]  MRN: [id number] DOB: 1945/07/23   Reason for Encounter: Hypertension Adherence Call    Recent office visits:  None  Recent consult visits:  01/09/2021 OV (Podiatry) Lorenda Peck, MD; Prescription for Meloxicam provided.  12/31/2020 OV (Gastro) Kennedy-Smith, Patrecia Pour, NP; no medication changes indicated.  Hospital visits:  None in previous 6 months  Medications: Outpatient Encounter Medications as of 02/05/2021  Medication Sig   albuterol (VENTOLIN HFA) 108 (90 Base) MCG/ACT inhaler Inhale 2 puffs into the lungs every 4 (four) hours as needed for wheezing or shortness of breath.   Ascorbic Acid (VITAMIN C GUMMIES PO) Take by mouth. Natures Made Vit C gummies   diltiazem (CARDIZEM CD) 360 MG 24 hr capsule Take 1 capsule by mouth once daily   doxazosin (CARDURA) 2 MG tablet Take 1 tablet by mouth once daily   fluticasone (FLONASE) 50 MCG/ACT nasal spray Place 2 sprays into both nostrils daily.   furosemide (LASIX) 40 MG tablet Take 1 tablet (40 mg total) by mouth 2 (two) times daily.   lisinopril (ZESTRIL) 40 MG tablet Take 1 tablet (40 mg total) by mouth daily.   meloxicam (MOBIC) 15 MG tablet Take 1 tablet (15 mg total) by mouth daily.   omeprazole (PRILOSEC) 40 MG capsule Take 1 capsule by mouth once daily   Polyethylene Glycol 3350 (MIRALAX PO) Take by mouth as needed.   rosuvastatin (CRESTOR) 10 MG tablet Take 1 tablet (10 mg total) by mouth daily.   tetrahydrozoline 0.05 % ophthalmic solution Place 1 drop into both eyes as needed (eye allergies).   triamcinolone (NASACORT) 55 MCG/ACT AERO nasal inhaler Place 2 sprays into the nose daily. 2 sprays each nostril at night   Facility-Administered Encounter Medications as of 02/05/2021  Medication   0.9 %  sodium chloride infusion   Reviewed chart prior to disease state call. Spoke with patient regarding BP  Recent Office Vitals: BP Readings from Last 3  Encounters:  12/31/20 120/70  09/12/20 140/72  09/04/20 134/82   Pulse Readings from Last 3 Encounters:  12/31/20 72  09/12/20 71  09/04/20 90    Wt Readings from Last 3 Encounters:  12/31/20 257 lb (116.6 kg)  09/12/20 255 lb (115.7 kg)  09/04/20 256 lb (116.1 kg)     Kidney Function Lab Results  Component Value Date/Time   CREATININE 1.03 12/31/2020 12:11 PM   CREATININE 0.99 08/31/2020 08:18 AM   CREATININE 1.04 (H) 04/02/2020 10:21 AM   CREATININE 0.85 11/07/2019 12:28 PM   GFR 53.92 (L) 12/31/2020 12:11 PM   GFRNONAA 54 (L) 01/04/2020 03:06 PM   GFRNONAA 68 11/07/2019 12:28 PM   GFRAA 62 01/04/2020 03:06 PM   GFRAA 79 11/07/2019 12:28 PM    BMP Latest Ref Rng & Units 12/31/2020 08/31/2020 04/02/2020  Glucose 70 - 99 mg/dL 85 94 100(H)  BUN 6 - 23 mg/dL 11 12 11   Creatinine 0.40 - 1.20 mg/dL 1.03 0.99 1.04(H)  BUN/Creat Ratio 6 - 22 (calc) - NOT APPLICABLE 11(X)  Sodium 135 - 145 mEq/L 144 143 142  Potassium 3.5 - 5.1 mEq/L 3.4(L) 4.1 3.5  Chloride 96 - 112 mEq/L 100 103 101  CO2 19 - 32 mEq/L 34(H) 32 26  Calcium 8.4 - 10.5 mg/dL 9.3 9.1 8.8    Current antihypertensive regimen:  Doxazosin 2 mg daily Diltiazem 260 mg daily Furosemide 40 mg twice daily Lisinopril 40 mg daily  How often are  you checking your Blood Pressure? infrequently  Current home BP readings: none to report  What recent interventions/DTPs have been made by any provider to improve Blood Pressure control since last CPP Visit: No recent interventions or DTPs.  Any recent hospitalizations or ED visits since last visit with CPP? No  What diet changes have been made to improve Blood Pressure Control?  No recent changes made. Patient states she tries to eat lots of vegetables.  What exercise is being done to improve your Blood Pressure Control?  Patient states she stays busy a lot helping take care of her daughter.  Adherence Review: Is the patient currently on ACE/ARB medication? Yes Does  the patient have >5 day gap between last estimated fill dates? No  Care Gaps: Medicare Annual Wellness: Completed 05/31/2020  Hemoglobin A1C: none available Colonoscopy: Next due on 12/15/2023 Dexa Scan: Completed Mammogram: Next due on 04/25/2021  Future Appointments  Date Time Provider [place]  03/11/2021  9:00 AM Werner Lean, MD [facility]  05/16/2021  2:15 PM [facility] PHARMACIST [facility] None  06/06/2021  8:15 AM BSF[facility]-NURSE HEALTH ADVISOR [facility] None   Star Rating Drugs: Lisinopril 40 mg last filled 11/21/2020 90 DS Rosuvastatin 10 mg last filled 12/21/2020 90 DS  April D Calhoun, [place] Pharmacist Assistant [phone]

## 2021-02-11 DIAGNOSIS — Z0100 Encounter for examination of eyes and vision without abnormal findings: Secondary | ICD-10-CM | POA: Diagnosis not present

## 2021-02-26 ENCOUNTER — Other Ambulatory Visit: Payer: Self-pay | Admitting: Internal Medicine

## 2021-02-26 ENCOUNTER — Other Ambulatory Visit: Payer: Self-pay | Admitting: Family Medicine

## 2021-02-26 DIAGNOSIS — J209 Acute bronchitis, unspecified: Secondary | ICD-10-CM

## 2021-02-28 DIAGNOSIS — M5416 Radiculopathy, lumbar region: Secondary | ICD-10-CM | POA: Diagnosis not present

## 2021-02-28 DIAGNOSIS — M25562 Pain in left knee: Secondary | ICD-10-CM | POA: Diagnosis not present

## 2021-02-28 DIAGNOSIS — M4316 Spondylolisthesis, lumbar region: Secondary | ICD-10-CM | POA: Diagnosis not present

## 2021-03-05 DIAGNOSIS — M25562 Pain in left knee: Secondary | ICD-10-CM | POA: Diagnosis not present

## 2021-03-05 DIAGNOSIS — M25561 Pain in right knee: Secondary | ICD-10-CM | POA: Diagnosis not present

## 2021-03-10 NOTE — Progress Notes (Signed)
Cardiology Office Note:    Date:  03/11/2021   ID:  [name], DOB 01-12-1946, MRN [id number]  PCP:  [name], MD  [facility] Cardiologist:  Werner Lean, MD  [place] Electrophysiologist:  None   CC: Follow up HFpEF  History of Present Illness:    [name] is a 76 y.o. female with a hx of Morbid Obesity,  HTN, HLD presened for evaluation 12/26/20.  Had normal CMR.  Stopped aldactone because of rash- see 03/23/20 note.  Also in 2020, lasix increased. Patient had hesitancy to start rosuvastatin 10 given husbands problems with atorvastatin in the past; did well.  Patient notes that she is doing well.   Has been minimally active There are no interval hospital/ED visit.    No chest pain or pressure.  No SOB/DOE and no PND/Orthopnea.  No weight gain or leg swelling.  No palpitations or syncope.  Mild left forarm pain that improved with exercise  Ambulatory blood pressure 130/70.    Past Medical History:  Diagnosis Date   Allergy    Arthritis    arthritis -knees   Colon polyps    benign- last colonoscopy 09-2008   Depression    GERD (gastroesophageal reflux disease)    hx GERD   H/O seasonal allergies    no problems now   Hyperlipidemia    Hypertension    Murmur, heart    Spinal stenosis of lumbar region at multiple levels     Past Surgical History:  Procedure Laterality Date   ABDOMINAL HYSTERECTOMY     APPENDECTOMY     with gallbladder surgery   BUNIONECTOMY Bilateral    CHOLECYSTECTOMY     open-gallstones   COLONOSCOPY     EXCISION OF ADNEXAL MASS Left August 03, 2013   at [facility]/right adnexal nodule,excision;benigh lymph node   OOPHORECTOMY Bilateral 2008   ovarian cyst   POLYPECTOMY      Current Medications: Current Meds  Medication Sig   albuterol (VENTOLIN HFA) 108 (90 Base) MCG/ACT inhaler INHALE 2 PUFFS BY MOUTH EVERY 4 HOURS AS NEEDED FOR WHEEZING FOR SHORTNESS OF BREATH   Ascorbic Acid (VITAMIN C GUMMIES PO)  Take by mouth. Natures Made Vit C gummies   diltiazem (CARDIZEM CD) 360 MG 24 hr capsule Take 1 capsule by mouth once daily   doxazosin (CARDURA) 2 MG tablet Take 1 tablet by mouth once daily   empagliflozin (JARDIANCE) 10 MG TABS tablet Take 1 tablet (10 mg total) by mouth daily before breakfast.   fluticasone (FLONASE) 50 MCG/ACT nasal spray Place 2 sprays into both nostrils daily.   furosemide (LASIX) 40 MG tablet Take 1 tablet (40 mg total) by mouth 2 (two) times daily.   lisinopril (ZESTRIL) 40 MG tablet Take 1 tablet by mouth once daily   meloxicam (MOBIC) 15 MG tablet Take 1 tablet (15 mg total) by mouth daily.   naproxen (NAPROSYN) 500 MG tablet TAKE 1 TABLET BY MOUTH TWICE DAILY AS NEEDED FOR MILD PAIN   omeprazole (PRILOSEC) 40 MG capsule Take 1 capsule by mouth once daily   Polyethylene Glycol 3350 (MIRALAX PO) Take by mouth as needed.   rosuvastatin (CRESTOR) 10 MG tablet Take 1 tablet (10 mg total) by mouth daily.   tetrahydrozoline 0.05 % ophthalmic solution Place 1 drop into both eyes as needed (eye allergies).   triamcinolone (NASACORT) 55 MCG/ACT AERO nasal inhaler Place 2 sprays into the nose daily. 2 sprays each nostril at night  Current Facility-Administered Medications for the 03/11/21 encounter (Office Visit) with Werner Lean, MD  Medication   0.9 %  sodium chloride infusion     Allergies:   Aldactone [spironolactone]   Social History   Socioeconomic History   Marital status: Married    Spouse name: Not on file   Number of children: Not on file   Years of education: Not on file   Highest education level: Not on file  Occupational History   Not on file  Tobacco Use   Smoking status: Never   Smokeless tobacco: Never  Vaping Use   Vaping Use: Never used  Substance and Sexual Activity   Alcohol use: No   Drug use: No   Sexual activity: Yes  Other Topics Concern   Not on file  Social History Narrative   Not on file   Social Determinants of  Health   Financial Resource Strain: Low Risk    Difficulty of Paying Living Expenses: Not hard at all  Food Insecurity: No Food Insecurity   Worried About Charity fundraiser in the Last Year: Never true   [place] in the Last Year: Never true  Transportation Needs: No Transportation Needs   Lack of Transportation (Medical): No   Lack of Transportation (Non-Medical): No  Physical Activity: Inactive   Days of Exercise per Week: 0 days   Minutes of Exercise per Session: 0 min  Stress: No Stress Concern Present   Feeling of Stress : Not at all  Social Connections: Moderately Isolated   Frequency of Communication with Friends and Family: More than three times a week   Frequency of Social Gatherings with Friends and Family: Three times a week   Attends Religious Services: More than 4 times per year   Active Member of Clubs or Organizations: No   Attends Archivist Meetings: Never   Marital Status: Widowed    Social: talked about hiding an egg for teething -kids at our second visit.  We usually tell stories about kids.  Family History: The patient's family history includes Breast cancer in her paternal aunt; Colon cancer (age of onset: 36) in her mother; Colon cancer (age of onset: 46) in her brother; Colon polyps in her brother; Heart attack in her father. There is no history of Esophageal cancer, Pancreatic cancer, Rectal cancer, or Stomach cancer. Father had MI and died at 6- no heart catheterization Son had MI and died at age 36- no heart catheterization Brother had CHF. No family history of hypertrophic cardiomyopathy. Brother- died in 18s.  ROS:   Please see the history of present illness.    All other systems reviewed and are negative.  EKGs/Labs/Other Studies Reviewed:    The following studies were reviewed today:  EKG:   03/11/21: SR rate 71 LVH 12/26/20 sinus rhythm rate 79 no pathologic q waves  Transthoracic Echocardiogram: Date: 12/07/19 Results:  Basal Septal Hypertrophy with Chordal SAM,Septal basal ratio ~ 1.3  1. Left ventricular ejection fraction, by estimation, is 60 to 65%. The  left ventricle has normal function. The left ventricle has no regional  wall motion abnormalities. There is severe asymmetric left ventricular  hypertrophy of the basal and septal  segments. Left ventricular diastolic parameters are consistent with Grade  I diastolic dysfunction (impaired relaxation).   2. Right ventricular systolic function is normal. The right ventricular  size is normal.   3. Left atrial size was mildly dilated.   4. The mitral valve is  normal in structure. No evidence of mitral valve  regurgitation. No evidence of mitral stenosis.   5. The aortic valve is tricuspid. Aortic valve regurgitation is trivial.  Mild to moderate aortic valve sclerosis/calcification is present, without  any evidence of aortic stenosis.   6. The inferior vena cava is normal in size with greater than 50%  respiratory variability, suggesting right atrial pressure of 3 mmHg.   CMR: Date: 03/21/20 Results: IMPRESSION: 1. Normal left ventricular size, with asymmetric basal septal hypertrophy and normal systolic function (LVEF = 62%). There are no regional wall motion abnormalities and no late gadolinium enhancement in the left ventricular myocardium. ECV is 28%. The maximum thickness of the basal septum is 14 mm, there is no LGE, no LVOT gradient or SAM.  These findings are not supportive of hypertrophic cardiomyopathy.  2. Normal right ventricular size, thickness and systolic function (RVEF = 62%). There are no regional wall motion abnormalities.  3. Mildly dilated left atrium, normal right atrial size.  4. Normal size of the aortic root, ascending aorta and pulmonary artery.  5.  No significant valvular abnormalities.  6.  Normal pericardium.  Trivial pericardial effusion.   Recent Labs: 04/02/2020: NT-Pro BNP 60 12/31/2020: ALT 14; BUN 11;  Creatinine, Ser 1.03; Hemoglobin 12.8; Platelets 209.0; Potassium 3.4; Sodium 144  Recent Lipid Panel    Component Value Date/Time   CHOL 127 08/31/2020 0818   CHOL 128 07/12/2020 0753   TRIG 63 08/31/2020 0818   HDL 48 (L) 08/31/2020 0818   HDL 45 07/12/2020 0753   CHOLHDL 2.6 08/31/2020 0818   VLDL 20 10/29/2015 0902   LDLCALC 65 08/31/2020 0818    Physical Exam:    VS:  BP 132/70    Pulse 74    Ht 5\' 4"  (1.626 m)    Wt 116.1 kg    SpO2 97%    BMI 43.94 kg/m     Wt Readings from Last 3 Encounters:  03/11/21 116.1 kg  12/31/20 116.6 kg  09/12/20 115.7 kg     GEN: Obese female, well developed in no acute distress HEENT: Normal NECK: JVD to mid neck but nadir to clavicle LYMPHATICS: No lymphadenopathy CARDIAC: RRR, II/VI holosystolic murmur  no rubs, gallops RESPIRATORY:  Clear to auscultation without rales, wheezing or rhonchi  ABDOMEN: Soft, non-tender, non-distended MUSCULOSKELETAL:  +1 edema bilaterally ; No deformity  SKIN: Warm and dry NEUROLOGIC:  Alert and oriented x 3 PSYCHIATRIC:  Normal affect   ASSESSMENT:    1. Heart failure with preserved ejection fraction, unspecified HF chronicity ([place])   2. Morbid obesity ([place])   3. Essential hypertension      PLAN:    Heart Failure Preserved Ejection Fraction  Morbid Obesity Essential HTN - NYHA class I, Stage C, near euvolemic , etiology from HTN and Obesity - Diuretic regimen: Lasix 40 mg PO BID - Starting Jardiance 10, can switch if insurance has preferred SGLT2i; labs in 2 weeks and amy decrease to lasix daily based on results - allergy to aldactone (itching), will defer eplerenone at this time - AMB BP at goal  Hyperlipidemia  -LDL goal less than 100 (at goal this August) - continue rosuvastatin 10 mg   Six months follow up unless new symptoms or abnormal test results warranting change in plan   Medication Adjustments/Labs and Tests Ordered: Current medicines are reviewed at length with the patient  today.  Concerns regarding medicines are outlined above.  Orders Placed This Encounter  Procedures  EKG 12-Lead    Meds ordered this encounter  Medications   empagliflozin (JARDIANCE) 10 MG TABS tablet    Sig: Take 1 tablet (10 mg total) by mouth daily before breakfast.    Dispense:  30 tablet    Refill:  11     Patient Instructions  Medication Instructions:  Your physician has recommended you make the following change in your medication:  START: empagliflozin (Jardiance) 10 mg by mouth once daily before breakfast  *If you need a refill on your cardiac medications before your next appointment, please call your pharmacy*   Lab Work: IN 2 WEEKS: BMP If you have labs (blood work) drawn today and your tests are completely normal, you will receive your results only by: [place] (if you have MyChart) OR A paper copy in the mail If you have any lab test that is abnormal or we need to change your treatment, we will call you to review the results.   Testing/Procedures: NONE   Follow-Up: At [facility], you and your health needs are our priority.  As part of our continuing mission to provide you with exceptional heart care, we have created designated Provider Care Teams.  These Care Teams include your primary Cardiologist (physician) and Advanced Practice Providers (APPs -  Physician Assistants and Nurse Practitioners) who all work together to provide you with the care you need, when you need it.   Your next appointment:   6 month(s)  The format for your next appointment:   In Person  Provider:   Werner Lean, MD      Signed, Werner Lean, MD  03/11/2021 9:27 AM    [place]

## 2021-03-11 ENCOUNTER — Other Ambulatory Visit: Payer: Self-pay

## 2021-03-11 ENCOUNTER — Encounter: Payer: Self-pay | Admitting: Internal Medicine

## 2021-03-11 ENCOUNTER — Telehealth: Payer: Self-pay | Admitting: Nurse Practitioner

## 2021-03-11 ENCOUNTER — Ambulatory Visit: Payer: Medicare HMO | Admitting: Internal Medicine

## 2021-03-11 VITALS — BP 132/70 | HR 74 | Ht 64.0 in | Wt 256.0 lb

## 2021-03-11 DIAGNOSIS — I10 Essential (primary) hypertension: Secondary | ICD-10-CM

## 2021-03-11 DIAGNOSIS — I5030 Unspecified diastolic (congestive) heart failure: Secondary | ICD-10-CM | POA: Diagnosis not present

## 2021-03-11 MED ORDER — EMPAGLIFLOZIN 10 MG PO TABS: 10.0000 mg | ORAL_TABLET | Freq: Every day | ORAL | 11 refills | Status: DC

## 2021-03-11 NOTE — Telephone Encounter (Signed)
Inbound call from patient states Dr. Garwin Brothers need the CT results faxed to his office. Patient would like a call back

## 2021-03-11 NOTE — Addendum Note (Signed)
Addended by: Precious Gilding on: 03/11/2021 09:52 AM   Modules accepted: Orders

## 2021-03-11 NOTE — Patient Instructions (Addendum)
Medication Instructions:  Your physician has recommended you make the following change in your medication:   START: empagliflozin (Jardiance) 10 mg by mouth once daily before breakfast We have provided you with a 30 day sample and 14 day free trial card.    *If you need a refill on your cardiac medications before your next appointment, please call your pharmacy*   Lab Work: IN 2 WEEKS: BMP If you have labs (blood work) drawn today and your tests are completely normal, you will receive your results only by: [place] (if you have MyChart) OR A paper copy in the mail If you have any lab test that is abnormal or we need to change your treatment, we will call you to review the results.   Testing/Procedures: NONE   Follow-Up: At [facility], you and your health needs are our priority.  As part of our continuing mission to provide you with exceptional heart care, we have created designated Provider Care Teams.  These Care Teams include your primary Cardiologist (physician) and Advanced Practice Providers (APPs -  Physician Assistants and Nurse Practitioners) who all work together to provide you with the care you need, when you need it.   Your next appointment:   6 month(s)  The format for your next appointment:   In Person  Provider:   Werner Lean, MD

## 2021-03-12 NOTE — Telephone Encounter (Signed)
Spoke with patient. CT faxed to [phone].

## 2021-03-12 NOTE — Telephone Encounter (Signed)
Faxed the CT abdomen and pelvis of 01/23/21 as requested.

## 2021-03-25 ENCOUNTER — Other Ambulatory Visit: Payer: Self-pay | Admitting: Family Medicine

## 2021-03-25 ENCOUNTER — Other Ambulatory Visit: Payer: Self-pay | Admitting: Internal Medicine

## 2021-03-25 ENCOUNTER — Other Ambulatory Visit: Payer: Medicare HMO | Admitting: *Deleted

## 2021-03-25 ENCOUNTER — Other Ambulatory Visit: Payer: Self-pay

## 2021-03-25 DIAGNOSIS — I10 Essential (primary) hypertension: Secondary | ICD-10-CM | POA: Diagnosis not present

## 2021-03-25 DIAGNOSIS — I5030 Unspecified diastolic (congestive) heart failure: Secondary | ICD-10-CM | POA: Diagnosis not present

## 2021-03-25 LAB — BASIC METABOLIC PANEL
BUN/Creatinine Ratio: 10 — ABNORMAL LOW (ref 12–28)
BUN: 12 mg/dL (ref 8–27)
CO2: 31 mmol/L — ABNORMAL HIGH (ref 20–29)
Calcium: 9.1 mg/dL (ref 8.7–10.3)
Chloride: 100 mmol/L (ref 96–106)
Creatinine, Ser: 1.25 mg/dL — ABNORMAL HIGH (ref 0.57–1.00)
Glucose: 104 mg/dL — ABNORMAL HIGH (ref 70–99)
Potassium: 3.2 mmol/L — ABNORMAL LOW (ref 3.5–5.2)
Sodium: 143 mmol/L (ref 134–144)
eGFR: 46 mL/min/{1.73_m2} — ABNORMAL LOW (ref >59)

## 2021-03-26 DIAGNOSIS — K219 Gastro-esophageal reflux disease without esophagitis: Secondary | ICD-10-CM | POA: Diagnosis not present

## 2021-03-26 DIAGNOSIS — R69 Illness, unspecified: Secondary | ICD-10-CM | POA: Diagnosis not present

## 2021-03-26 DIAGNOSIS — I110 Hypertensive heart disease with heart failure: Secondary | ICD-10-CM | POA: Diagnosis not present

## 2021-03-26 DIAGNOSIS — E261 Secondary hyperaldosteronism: Secondary | ICD-10-CM | POA: Diagnosis not present

## 2021-03-26 DIAGNOSIS — E1136 Type 2 diabetes mellitus with diabetic cataract: Secondary | ICD-10-CM | POA: Diagnosis not present

## 2021-03-26 DIAGNOSIS — J45909 Unspecified asthma, uncomplicated: Secondary | ICD-10-CM | POA: Diagnosis not present

## 2021-03-26 DIAGNOSIS — Z008 Encounter for other general examination: Secondary | ICD-10-CM | POA: Diagnosis not present

## 2021-03-26 DIAGNOSIS — E785 Hyperlipidemia, unspecified: Secondary | ICD-10-CM | POA: Diagnosis not present

## 2021-03-26 DIAGNOSIS — Z6841 Body Mass Index (BMI) 40.0 and over, adult: Secondary | ICD-10-CM | POA: Diagnosis not present

## 2021-03-26 DIAGNOSIS — I509 Heart failure, unspecified: Secondary | ICD-10-CM | POA: Diagnosis not present

## 2021-03-26 DIAGNOSIS — G8929 Other chronic pain: Secondary | ICD-10-CM | POA: Diagnosis not present

## 2021-03-27 ENCOUNTER — Telehealth: Payer: Self-pay

## 2021-03-27 DIAGNOSIS — I5030 Unspecified diastolic (congestive) heart failure: Secondary | ICD-10-CM

## 2021-03-27 DIAGNOSIS — I10 Essential (primary) hypertension: Secondary | ICD-10-CM

## 2021-03-27 MED ORDER — FUROSEMIDE 40 MG PO TABS: 40.0000 mg | ORAL_TABLET | Freq: Every day | ORAL | 3 refills | Status: DC

## 2021-03-27 NOTE — Telephone Encounter (Signed)
-----   Message from Werner Lean, MD sent at 03/26/2021  8:56 AM EST ----- ?Interpretation assumes she is on her new SGLT2i ?Creatinine has increased, K is down.  Would back of lasix to 40 mg PO daily and repeat BMP in two weeks; may need K supplementation off of aldactone ?----- Message ----- ?From: Interface, [facility] Lab Results In ?Sent: 03/25/2021   5:36 PM EST ?To: Werner Lean, MD ? ? ?

## 2021-03-27 NOTE — Telephone Encounter (Signed)
The patient has been notified of the result and verbalized understanding.  All questions (if any) were answered. ?Precious Gilding, RN 03/27/2021 11:06 AM   ?Lab appointment scheduled for 04/11/21.  ?

## 2021-04-02 ENCOUNTER — Other Ambulatory Visit: Payer: Self-pay | Admitting: Obstetrics and Gynecology

## 2021-04-02 DIAGNOSIS — Z1231 Encounter for screening mammogram for malignant neoplasm of breast: Secondary | ICD-10-CM

## 2021-04-09 ENCOUNTER — Encounter: Payer: Self-pay | Admitting: Family Medicine

## 2021-04-09 ENCOUNTER — Ambulatory Visit (INDEPENDENT_AMBULATORY_CARE_PROVIDER_SITE_OTHER): Payer: Medicare HMO | Admitting: Family Medicine

## 2021-04-09 ENCOUNTER — Other Ambulatory Visit: Payer: Self-pay

## 2021-04-09 VITALS — BP 132/72 | HR 79 | Temp 97.9°F | Resp 18 | Ht 64.0 in | Wt 259.0 lb

## 2021-04-09 DIAGNOSIS — L600 Ingrowing nail: Secondary | ICD-10-CM | POA: Diagnosis not present

## 2021-04-09 NOTE — Progress Notes (Signed)
? ?Subjective:  ? ? Patient ID: [name], female    DOB: 1946-01-04, 76 y.o.   MRN: [id number] ? ?HPI ?Patient is a very pleasant 76 year old African-American female who presents today with pain in her right thumb.  The radial portion of the fingernail is ingrown particularly the distal tip and causing pain.  There is a longitudinal ridge through the entire nail back to the proximal nail fold.  There appears to be a pale soft tissue mass emanating from the nail matrix causing the dysmorphic features of the fingernail which is triggering an ingrown nail.  I have referred the patient to a dermatologist to determine if we need to biopsy this mass in the past.  She recommended watching it.  However the patient has not seen a dermatologist since and they cannot get her in until June. ?Past Medical History:  ?Diagnosis Date  ? Allergy   ? Arthritis   ? arthritis -knees  ? Colon polyps   ? benign- last colonoscopy 09-2008  ? Depression   ? GERD (gastroesophageal reflux disease)   ? hx GERD  ? H/O seasonal allergies   ? no problems now  ? Hyperlipidemia   ? Hypertension   ? Murmur, heart   ? Spinal stenosis of lumbar region at multiple levels   ? ?Past Surgical History:  ?Procedure Laterality Date  ? ABDOMINAL HYSTERECTOMY    ? APPENDECTOMY    ? with gallbladder surgery  ? BUNIONECTOMY Bilateral   ? CHOLECYSTECTOMY    ? open-gallstones  ? COLONOSCOPY    ? EXCISION OF ADNEXAL MASS Left August 03, 2013  ? at [facility] [place]/right adnexal nodule,excision;benigh lymph node  ? OOPHORECTOMY Bilateral 2008  ? ovarian cyst  ? POLYPECTOMY    ? ?Current Outpatient Medications on File Prior to Visit  ?Medication Sig Dispense Refill  ? albuterol (VENTOLIN HFA) 108 (90 Base) MCG/ACT inhaler INHALE 2 PUFFS BY MOUTH EVERY 4 HOURS AS NEEDED FOR WHEEZING FOR SHORTNESS OF BREATH 18 g 0  ? Ascorbic Acid (VITAMIN C GUMMIES PO) Take by mouth. Natures Made Vit C gummies    ? diltiazem (CARDIZEM CD) 360 MG 24 hr capsule Take 1 capsule by mouth once  daily 90 capsule 0  ? doxazosin (CARDURA) 2 MG tablet Take 1 tablet by mouth once daily 90 tablet 0  ? empagliflozin (JARDIANCE) 10 MG TABS tablet Take 1 tablet (10 mg total) by mouth daily before breakfast. 30 tablet 11  ? fluticasone (FLONASE) 50 MCG/ACT nasal spray Place 2 sprays into both nostrils daily. 16 g 6  ? furosemide (LASIX) 40 MG tablet Take 1 tablet (40 mg total) by mouth daily. 90 tablet 3  ? lisinopril (ZESTRIL) 40 MG tablet Take 1 tablet by mouth once daily 90 tablet 2  ? meloxicam (MOBIC) 15 MG tablet Take 1 tablet (15 mg total) by mouth daily. 30 tablet 0  ? naproxen (NAPROSYN) 500 MG tablet TAKE 1 TABLET BY MOUTH TWICE DAILY AS NEEDED FOR MILD PAIN 180 tablet 0  ? omeprazole (PRILOSEC) 40 MG capsule Take 1 capsule by mouth once daily 90 capsule 3  ? Polyethylene Glycol 3350 (MIRALAX PO) Take by mouth as needed.    ? rosuvastatin (CRESTOR) 10 MG tablet Take 1 tablet by mouth once daily 90 tablet 0  ? tetrahydrozoline 0.05 % ophthalmic solution Place 1 drop into both eyes as needed (eye allergies).    ? triamcinolone (NASACORT) 55 MCG/ACT AERO nasal inhaler Place 2 sprays into the nose daily.  2 sprays each nostril at night 1 each 12  ? ?No current facility-administered medications on file prior to visit.  ? ?Allergies  ?Allergen Reactions  ? Aldactone [Spironolactone] Rash  ? ?Social History  ? ?Socioeconomic History  ? Marital status: Married  ?  Spouse name: Not on file  ? Number of children: Not on file  ? Years of education: Not on file  ? Highest education level: Not on file  ?Occupational History  ? Not on file  ?Tobacco Use  ? Smoking status: Never  ? Smokeless tobacco: Never  ?Vaping Use  ? Vaping Use: Never used  ?Substance and Sexual Activity  ? Alcohol use: No  ? Drug use: No  ? Sexual activity: Yes  ?Other Topics Concern  ? Not on file  ?Social History Narrative  ? Not on file  ? ?Social Determinants of Health  ? ?Financial Resource Strain: Low Risk   ? Difficulty of Paying Living  Expenses: Not hard at all  ?Food Insecurity: No Food Insecurity  ? Worried About Charity fundraiser in the Last Year: Never true  ? Ran Out of Food in the Last Year: Never true  ?Transportation Needs: No Transportation Needs  ? Lack of Transportation (Medical): No  ? Lack of Transportation (Non-Medical): No  ?Physical Activity: Inactive  ? Days of Exercise per Week: 0 days  ? Minutes of Exercise per Session: 0 min  ?Stress: No Stress Concern Present  ? Feeling of Stress : Not at all  ?Social Connections: Moderately Isolated  ? Frequency of Communication with Friends and Family: More than three times a week  ? Frequency of Social Gatherings with Friends and Family: Three times a week  ? Attends Religious Services: More than 4 times per year  ? Active Member of Clubs or Organizations: No  ? Attends Archivist Meetings: Never  ? Marital Status: Widowed  ?Intimate Partner Violence: Not At Risk  ? Fear of Current or Ex-Partner: No  ? Emotionally Abused: No  ? Physically Abused: No  ? Sexually Abused: No  ? ? ? ? ?Review of Systems  ?All other systems reviewed and are negative. ? ?   ?Objective:  ? Physical Exam ?Vitals reviewed.  ?Constitutional:   ?   Appearance: Normal appearance.  ?Cardiovascular:  ?   Rate and Rhythm: Normal rate and regular rhythm.  ?   Pulses: Normal pulses.  ?   Heart sounds: Normal heart sounds.  ?Pulmonary:  ?   Effort: Pulmonary effort is normal.  ?   Breath sounds: Normal breath sounds.  ?Neurological:  ?   Mental Status: She is alert.  ?The radial portion of the right thumbnail is ingrown.  There is a longitudinal ridge throughout the entire nail progressing along the proximal nail fold.  There appears to be a soft tissue mass growing underneath the nail.  The nail matrix causing the dysmorphic features of the fingernail. ? ? ? ? ?   ?Assessment & Plan:  ?Ingrown thumb nail, right ?I believe the patient's pain is due to the ingrown fingernail.  That the ingrown fingernail is due to  the abnormal germinal matrix.  I have recommended an orthopedic/hand consultation to remove the ingrown portion of the nail and perhaps biopsy the soft tissue lesion emanating from the underlying nail matrix to rule out any type of malignancy. ? ?

## 2021-04-11 ENCOUNTER — Other Ambulatory Visit: Payer: Self-pay

## 2021-04-11 ENCOUNTER — Other Ambulatory Visit: Payer: Medicare HMO

## 2021-04-11 DIAGNOSIS — I5030 Unspecified diastolic (congestive) heart failure: Secondary | ICD-10-CM | POA: Diagnosis not present

## 2021-04-11 DIAGNOSIS — I10 Essential (primary) hypertension: Secondary | ICD-10-CM | POA: Diagnosis not present

## 2021-04-11 LAB — BASIC METABOLIC PANEL
BUN/Creatinine Ratio: 12 (ref 12–28)
BUN: 12 mg/dL (ref 8–27)
CO2: 28 mmol/L (ref 20–29)
Calcium: 9.5 mg/dL (ref 8.7–10.3)
Chloride: 102 mmol/L (ref 96–106)
Creatinine, Ser: 1.03 mg/dL — ABNORMAL HIGH (ref 0.57–1.00)
Glucose: 123 mg/dL — ABNORMAL HIGH (ref 70–99)
Potassium: 4 mmol/L (ref 3.5–5.2)
Sodium: 145 mmol/L — ABNORMAL HIGH (ref 134–144)
eGFR: 57 mL/min/{1.73_m2} — ABNORMAL LOW (ref >59)

## 2021-04-16 ENCOUNTER — Ambulatory Visit: Payer: Medicare HMO | Admitting: Orthopedic Surgery

## 2021-04-16 ENCOUNTER — Other Ambulatory Visit: Payer: Self-pay

## 2021-04-16 ENCOUNTER — Ambulatory Visit (INDEPENDENT_AMBULATORY_CARE_PROVIDER_SITE_OTHER): Payer: Medicare HMO

## 2021-04-16 DIAGNOSIS — L608 Other nail disorders: Secondary | ICD-10-CM | POA: Insufficient documentation

## 2021-04-16 NOTE — Progress Notes (Signed)
? ?Office Visit Note ?  ?Patient: [name]           ?Date of Birth: 1945/06/30           ?MRN: [id number] ?Visit Date: 04/16/2021 ?             ?Requested by: [name], MD ?[address] [place],  [place] [address] ?PCP: [name], MD ? ? ?Assessment & Plan: ?Visit Diagnoses:  ?1. Acquired deformity of nail plate   ?2. Pincer nail deformity   ? ? ?Plan: Patient has a pincer nail deformity of this right thumb nail plate.  She does not have any sensitivity or pain at the tip of the finger under the nail plate or cold sensitivity concerning for a glomus tumor.  She does not have any evidence of a mucous cyst.  We discussed both conservative and operative treatment options for her pincer nail.  Terms of surgical options, we discussed removal of the nail plate with possible alteration of the underlying nailbed to prevent recurrent pincer deformity.  After our discussion, she would like to continue to observe her finger for now.  The thumb nail is not terribly bothersome to her at this point.  She will come back and see me if that changes. ? ?Follow-Up Instructions: No follow-ups on file.  ? ?Orders:  ?Orders Placed This Encounter  ?Procedures  ? XR Finger Thumb Right  ? ?No orders of the defined types were placed in this encounter. ? ? ? ? Procedures: ?No procedures performed ? ? ?Clinical Data: ?No additional findings. ? ? ?Subjective: ?Chief Complaint  ?Patient presents with  ? Right Hand - Pain  ? ? ?This is a 76 year old right-hand-dominant female who presents with nail deformity of the right thumb.  This was noticed 3 weeks ago.  She denies any injury to her thumb.  She notes that the thumb just became "ingrown".  It will occasionally be bothersome when the nail digs into the radial nail fold.  She has no pain today.  She has noted disruption of her daily activities secondary to pain and discomfort.  She denies any pain underneath the nail plate and denies any cold sensitivity in this  finger.  She had no treatment so far. ? ? ?Review of Systems ? ? ?Objective: ?Vital Signs: There were no vitals taken for this visit. ? ?Physical Exam ?Constitutional:   ?   Appearance: Normal appearance.  ?Cardiovascular:  ?   Rate and Rhythm: Normal rate.  ?   Pulses: Normal pulses.  ?Pulmonary:  ?   Effort: Pulmonary effort is normal.  ?Skin: ?   General: Skin is warm and dry.  ?   Capillary Refill: Capillary refill takes less than 2 seconds.  ?Neurological:  ?   Mental Status: She is alert.  ? ? ?Right Hand Exam  ? ?Tenderness  ?The patient is experiencing no tenderness.  ? ?Other  ?Erythema: absent ?Sensation: normal ?Pulse: present ? ?Comments:  Pincer deformity of the thumb nail with a ridge at the radial half of the nail plate.  There is no paronychial inflammation.  There is no evidence of a mucous cyst at the proximal nail fold.  She has no pinpoint tenderness underneath the nail plate or cold intolerance.  There is no discoloration underneath the nail. ? ? ? ? ?Specialty Comments:  ?No specialty comments available. ? ?Imaging: ?No results found. ? ? ?PMFS History: ?Patient Active Problem List  ? Diagnosis  Date Noted  ? Pincer nail deformity 04/16/2021  ? Diverticulitis 12/27/2020  ? History of hysterectomy 12/27/2020  ? Morbid obesity ([place]) 03/23/2020  ? Heart failure with preserved ejection fraction ([place]) 12/27/2019  ? Bilateral knee pain 02/15/2019  ? Spinal stenosis of lumbar region at multiple levels   ? Arthropathia 09/23/2013  ? Benign neoplasm of colon 09/23/2013  ? Essential (primary) hypertension 09/23/2013  ? SBO (small bowel obstruction) ([place]) 08/17/2013  ? Pelvic mass 07/05/2013  ? Pain in joint, pelvic region and thigh 07/05/2013  ? Hyperlipidemia   ? Arthritis   ? Colon polyps   ? ?Past Medical History:  ?Diagnosis Date  ? Allergy   ? Arthritis   ? arthritis -knees  ? Colon polyps   ? benign- last colonoscopy 09-2008  ? Depression   ? GERD (gastroesophageal reflux disease)   ? hx GERD  ? H/O  seasonal allergies   ? no problems now  ? Hyperlipidemia   ? Hypertension   ? Murmur, heart   ? Spinal stenosis of lumbar region at multiple levels   ?  ?Family History  ?Problem Relation Age of Onset  ? Colon cancer Mother 20  ? Heart attack Father   ? Breast cancer Paternal Aunt   ? Colon cancer Brother 31  ? Colon polyps Brother   ? Esophageal cancer Neg Hx   ? Pancreatic cancer Neg Hx   ? Rectal cancer Neg Hx   ? Stomach cancer Neg Hx   ?  ?Past Surgical History:  ?Procedure Laterality Date  ? ABDOMINAL HYSTERECTOMY    ? APPENDECTOMY    ? with gallbladder surgery  ? BUNIONECTOMY Bilateral   ? CHOLECYSTECTOMY    ? open-gallstones  ? COLONOSCOPY    ? EXCISION OF ADNEXAL MASS Left August 03, 2013  ? at [facility] [place]/right adnexal nodule,excision;benigh lymph node  ? OOPHORECTOMY Bilateral 2008  ? ovarian cyst  ? POLYPECTOMY    ? ?Social History  ? ?Occupational History  ? Not on file  ?Tobacco Use  ? Smoking status: Never  ? Smokeless tobacco: Never  ?Vaping Use  ? Vaping Use: Never used  ?Substance and Sexual Activity  ? Alcohol use: No  ? Drug use: No  ? Sexual activity: Yes  ? ? ? ? ? ? ?

## 2021-04-18 ENCOUNTER — Other Ambulatory Visit: Payer: Self-pay | Admitting: Family Medicine

## 2021-04-26 ENCOUNTER — Ambulatory Visit
Admission: RE | Admit: 2021-04-26 | Discharge: 2021-04-26 | Disposition: A | Discharge status: Home / Self Care | Payer: Medicare HMO | Source: Ambulatory Visit | Attending: Obstetrics and Gynecology | Admitting: Obstetrics and Gynecology

## 2021-04-26 DIAGNOSIS — Z1231 Encounter for screening mammogram for malignant neoplasm of breast: Secondary | ICD-10-CM | POA: Diagnosis not present

## 2021-04-30 DIAGNOSIS — M5416 Radiculopathy, lumbar region: Secondary | ICD-10-CM | POA: Diagnosis not present

## 2021-05-16 ENCOUNTER — Telehealth: Payer: Medicare HMO

## 2021-05-19 ENCOUNTER — Other Ambulatory Visit: Payer: Self-pay | Admitting: Internal Medicine

## 2021-06-05 ENCOUNTER — Telehealth: Payer: Self-pay | Admitting: Pharmacist

## 2021-06-05 NOTE — Progress Notes (Signed)
Chronic Care Management Pharmacy Assistant   Name: [name]  MRN: [id number] DOB: Apr 07, 1945  Reason for Encounter: Hypertension Adherence Call    Recent office visits:  04/09/2021 OV (PCP) [name], MD; no medication changes indicated.  Recent consult visits:  04/16/2021 OV (Orthopedics) Sherilyn Cooter, MD; no medication changes indicated.  03/11/2021 OV (Cardiology) Werner Lean, MD; - Starting Jardiance 10, can switch if insurance has preferred SGLT2i; labs in 2 weeks and amy decrease to lasix daily based on results.  Hospital visits:  None in previous 6 months  Medications: Outpatient Encounter Medications as of 06/05/2021  Medication Sig   albuterol (VENTOLIN HFA) 108 (90 Base) MCG/ACT inhaler INHALE 2 PUFFS BY MOUTH EVERY 4 HOURS AS NEEDED FOR WHEEZING FOR SHORTNESS OF BREATH   Ascorbic Acid (VITAMIN C GUMMIES PO) Take by mouth. Natures Made Vit C gummies   diltiazem (CARDIZEM CD) 360 MG 24 hr capsule Take 1 capsule by mouth once daily   doxazosin (CARDURA) 2 MG tablet Take 1 tablet by mouth once daily   empagliflozin (JARDIANCE) 10 MG TABS tablet Take 1 tablet (10 mg total) by mouth daily before breakfast.   fluticasone (FLONASE) 50 MCG/ACT nasal spray Place 2 sprays into both nostrils daily.   furosemide (LASIX) 40 MG tablet Take 1 tablet by mouth twice daily   lisinopril (ZESTRIL) 40 MG tablet Take 1 tablet by mouth once daily   meloxicam (MOBIC) 15 MG tablet Take 1 tablet (15 mg total) by mouth daily.   naproxen (NAPROSYN) 500 MG tablet TAKE 1 TABLET BY MOUTH TWICE DAILY AS NEEDED FOR MILD PAIN   omeprazole (PRILOSEC) 40 MG capsule Take 1 capsule by mouth once daily   Polyethylene Glycol 3350 (MIRALAX PO) Take by mouth as needed.   rosuvastatin (CRESTOR) 10 MG tablet Take 1 tablet by mouth once daily   tetrahydrozoline 0.05 % ophthalmic solution Place 1 drop into both eyes as needed (eye allergies).   triamcinolone (NASACORT) 55 MCG/ACT  AERO nasal inhaler Place 2 sprays into the nose daily. 2 sprays each nostril at night   No facility-administered encounter medications on file as of 06/05/2021.   Reviewed chart prior to disease state call. Spoke with patient regarding BP  Recent Office Vitals: BP Readings from Last 3 Encounters:  04/09/21 132/72  03/11/21 132/70  12/31/20 120/70   Pulse Readings from Last 3 Encounters:  04/09/21 79  03/11/21 74  12/31/20 72    Wt Readings from Last 3 Encounters:  04/09/21 259 lb (117.5 kg)  03/11/21 256 lb (116.1 kg)  12/31/20 257 lb (116.6 kg)     Kidney Function Lab Results  Component Value Date/Time   CREATININE 1.03 (H) 04/11/2021 09:25 AM   CREATININE 1.25 (H) 03/25/2021 10:53 AM   CREATININE 0.99 08/31/2020 08:18 AM   CREATININE 0.85 11/07/2019 12:28 PM   GFR 53.92 (L) 12/31/2020 12:11 PM   GFRNONAA 54 (L) 01/04/2020 03:06 PM   GFRNONAA 68 11/07/2019 12:28 PM   GFRAA 62 01/04/2020 03:06 PM   GFRAA 79 11/07/2019 12:28 PM       Latest Ref Rng & Units 04/11/2021    9:25 AM 03/25/2021   10:53 AM 12/31/2020   12:11 PM  BMP  Glucose 70 - 99 mg/dL 123   104   85    BUN 8 - 27 mg/dL '12   12   11    '$ Creatinine 0.57 - 1.00 mg/dL 1.03   1.25   1.03  BUN/Creat Ratio 12 - '28 12   10     '$ Sodium 134 - 144 mmol/L 145   143   144    Potassium 3.5 - 5.2 mmol/L 4.0   3.2   3.4    Chloride 96 - 106 mmol/L 102   100   100    CO2 20 - 29 mmol/L 28   31   34    Calcium 8.7 - 10.3 mg/dL 9.5   9.1   9.3      Current antihypertensive regimen:  Lisinopril 40 mg daily Furosemide 40 mg twice daily  How often are you checking your Blood Pressure? 1-2x per week  Current home BP readings: 123/81  What recent interventions/DTPs have been made by any provider to improve Blood Pressure control since last CPP Visit: No recent interventions or DTPs.  Any recent hospitalizations or ED visits since last visit with CPP? No  What diet changes have been made to improve Blood Pressure  Control?  No recent diet changes made.  What exercise is being done to improve your Blood Pressure Control?  Patient states she stays busy a lot helping take care of her daughter.  Adherence Review: Is the patient currently on ACE/ARB medication? Yes Does the patient have >5 day gap between last estimated fill dates? No  Care Gaps: Medicare Annual Wellness: Scheduled 06/13/2021 Hemoglobin A1C: none available Colonoscopy: Next due on 12/15/2023 Dexa Scan: Completed Mammogram: Next due on 04/27/2022  Future Appointments  Date Time Provider [place]  06/13/2021  8:15 AM [facility]-NURSE HEALTH ADVISOR [facility]   Star Rating Drugs: Jardiance 10 mg last filled 05/19/2021 30 DS Lisinopril 40 mg last filled 05/24/2021  April D Calhoun, [place] Pharmacist Assistant [phone]

## 2021-06-06 ENCOUNTER — Ambulatory Visit: Payer: Medicare HMO

## 2021-06-08 ENCOUNTER — Other Ambulatory Visit: Payer: Self-pay | Admitting: Family Medicine

## 2021-06-11 NOTE — Telephone Encounter (Signed)
Requested Prescriptions  ?Pending Prescriptions Disp Refills  ?? diltiazem (CARDIZEM CD) 360 MG 24 hr capsule [Pharmacy Med Name: dilTIAZem HCl ER Coated Beads 360 MG Oral Capsule Extended Release 24 Hour] 90 capsule 0  ?  Sig: Take 1 capsule by mouth once daily  ?  ? Cardiovascular: Calcium Channel Blockers 3 Failed - 06/08/2021  9:51 AM  ?  ?  Failed - Cr in normal range and within 360 days  ?  Creat  ?Date Value Ref Range Status  ?08/31/2020 0.99 0.60 - 1.00 mg/dL Final  ? ?Creatinine, Ser  ?Date Value Ref Range Status  ?04/11/2021 1.03 (H) 0.57 - 1.00 mg/dL Final  ?   ?  ?  Passed - ALT in normal range and within 360 days  ?  ALT  ?Date Value Ref Range Status  ?12/31/2020 14 0 - 35 U/L Final  ?   ?  ?  Passed - AST in normal range and within 360 days  ?  AST  ?Date Value Ref Range Status  ?12/31/2020 17 0 - 37 U/L Final  ?   ?  ?  Passed - Last BP in normal range  ?  BP Readings from Last 1 Encounters:  ?04/09/21 132/72  ?   ?  ?  Passed - Last Heart Rate in normal range  ?  Pulse Readings from Last 1 Encounters:  ?04/09/21 79  ?   ?  ?  Passed - Valid encounter within last 6 months  ?  Recent Outpatient Visits   ?      ? 2 months ago Ingrown thumb nail, right  ? [facility] Pickard, [name], MD  ? 9 months ago General medical exam  ? [facility] [name], MD  ? 10 months ago Sinus pressure  ? [place], NP  ? 1 year ago Spinal stenosis of lumbar region at multiple levels  ? [facility] Pickard, [name], MD  ? 1 year ago Spinal stenosis at L4-L5 level  ? [facility] Pickard, [name], MD  ?  ?  ? ?  ?  ?  ? ?

## 2021-06-13 ENCOUNTER — Other Ambulatory Visit: Payer: Self-pay | Admitting: Family Medicine

## 2021-06-13 ENCOUNTER — Ambulatory Visit (INDEPENDENT_AMBULATORY_CARE_PROVIDER_SITE_OTHER): Payer: Medicare HMO

## 2021-06-13 ENCOUNTER — Telehealth: Payer: Self-pay

## 2021-06-13 VITALS — Ht 64.0 in | Wt 250.0 lb

## 2021-06-13 DIAGNOSIS — F320 Major depressive disorder, single episode, mild: Secondary | ICD-10-CM

## 2021-06-13 DIAGNOSIS — Z0000 Encounter for general adult medical examination without abnormal findings: Secondary | ICD-10-CM

## 2021-06-13 DIAGNOSIS — R69 Illness, unspecified: Secondary | ICD-10-CM | POA: Diagnosis not present

## 2021-06-13 NOTE — Patient Instructions (Signed)
[name] , Thank you for taking time to come for your Medicare Wellness Visit. I appreciate your ongoing commitment to your health goals. Please review the following plan we discussed and let me know if I can assist you in the future.   Screening recommendations/referrals: Colonoscopy: Done 12/15/2018 Repeat in 5 years  Mammogram: Done 04/26/2021 Repeat annually  Bone Density: Done 09/01/2016 Repeat every 2 years  Recommended yearly ophthalmology/optometry visit for glaucoma screening and checkup Recommended yearly dental visit for hygiene and checkup  Vaccinations: Influenza vaccine: Done 11/14/2020 Repeat annually  Pneumococcal vaccine: Done 10/20/2014 and 06/17/2012 Tdap vaccine: Due Repeat in 10 years  Shingles vaccine: Discussed.    Covid-19:Done 02/15/2019, 03/07/2019 and 12/05/2019.  Advanced directives: Advance directive discussed with you today. Even though you declined this today, please call our office should you change your mind, and we can give you the proper paperwork for you to fill out.   Conditions/risks identified: Aim for 30 minutes of exercise or brisk walking, 6-8 glasses of water, and 5 servings of fruits and vegetables each day.   Next appointment: Follow up in one year for your annual wellness visit 2024.   Preventive Care 74 Years and Older, Female Preventive care refers to lifestyle choices and visits with your health care provider that can promote health and wellness. What does preventive care include? A yearly physical exam. This is also called an annual well check. Dental exams once or twice a year. Routine eye exams. Ask your health care provider how often you should have your eyes checked. Personal lifestyle choices, including: Daily care of your teeth and gums. Regular physical activity. Eating a healthy diet. Avoiding tobacco and drug use. Limiting alcohol use. Practicing safe sex. Taking low-dose aspirin every day. Taking vitamin and mineral  supplements as recommended by your health care provider. What happens during an annual well check? The services and screenings done by your health care provider during your annual well check will depend on your age, overall health, lifestyle risk factors, and family history of disease. Counseling  Your health care provider may ask you questions about your: Alcohol use. Tobacco use. Drug use. Emotional well-being. Home and relationship well-being. Sexual activity. Eating habits. History of falls. Memory and ability to understand (cognition). Work and work Statistician. Reproductive health. Screening  You may have the following tests or measurements: Height, weight, and BMI. Blood pressure. Lipid and cholesterol levels. These may be checked every 5 years, or more frequently if you are over 5 years old. Skin check. Lung cancer screening. You may have this screening every year starting at age 74 if you have a 30-pack-year history of smoking and currently smoke or have quit within the past 15 years. Fecal occult blood test (FOBT) of the stool. You may have this test every year starting at age 50. Flexible sigmoidoscopy or colonoscopy. You may have a sigmoidoscopy every 5 years or a colonoscopy every 10 years starting at age 45. Hepatitis C blood test. Hepatitis B blood test. Sexually transmitted disease (STD) testing. Diabetes screening. This is done by checking your blood sugar (glucose) after you have not eaten for a while (fasting). You may have this done every 1-3 years. Bone density scan. This is done to screen for osteoporosis. You may have this done starting at age 74. Mammogram. This may be done every 1-2 years. Talk to your health care provider about how often you should have regular mammograms. Talk with your health care provider about your test results, treatment  options, and if necessary, the need for more tests. Vaccines  Your health care provider may recommend certain  vaccines, such as: Influenza vaccine. This is recommended every year. Tetanus, diphtheria, and acellular pertussis (Tdap, Td) vaccine. You may need a Td booster every 10 years. Zoster vaccine. You may need this after age 74. Pneumococcal 13-valent conjugate (PCV13) vaccine. One dose is recommended after age 74. Pneumococcal polysaccharide (PPSV23) vaccine. One dose is recommended after age 74. Talk to your health care provider about which screenings and vaccines you need and how often you need them. This information is not intended to replace advice given to you by your health care provider. Make sure you discuss any questions you have with your health care provider. Document Released: 02/09/2015 Document Revised: 10/03/2015 Document Reviewed: 11/14/2014 Elsevier Interactive Patient Education  2017 [place] Prevention in the Home Falls can cause injuries. They can happen to people of all ages. There are many things you can do to make your home safe and to help prevent falls. What can I do on the outside of my home? Regularly fix the edges of walkways and driveways and fix any cracks. Remove anything that might make you trip as you walk through a door, such as a raised step or threshold. Trim any bushes or trees on the path to your home. Use bright outdoor lighting. Clear any walking paths of anything that might make someone trip, such as rocks or tools. Regularly check to see if handrails are loose or broken. Make sure that both sides of any steps have handrails. Any raised decks and porches should have guardrails on the edges. Have any leaves, snow, or ice cleared regularly. Use sand or salt on walking paths during winter. Clean up any spills in your garage right away. This includes oil or grease spills. What can I do in the bathroom? Use night lights. Install grab bars by the toilet and in the tub and shower. Do not use towel bars as grab bars. Use non-skid mats or decals in  the tub or shower. If you need to sit down in the shower, use a plastic, non-slip stool. Keep the floor dry. Clean up any water that spills on the floor as soon as it happens. Remove soap buildup in the tub or shower regularly. Attach bath mats securely with double-sided non-slip rug tape. Do not have throw rugs and other things on the floor that can make you trip. What can I do in the bedroom? Use night lights. Make sure that you have a light by your bed that is easy to reach. Do not use any sheets or blankets that are too big for your bed. They should not hang down onto the floor. Have a firm chair that has side arms. You can use this for support while you get dressed. Do not have throw rugs and other things on the floor that can make you trip. What can I do in the kitchen? Clean up any spills right away. Avoid walking on wet floors. Keep items that you use a lot in easy-to-reach places. If you need to reach something above you, use a strong step stool that has a grab bar. Keep electrical cords out of the way. Do not use floor polish or wax that makes floors slippery. If you must use wax, use non-skid floor wax. Do not have throw rugs and other things on the floor that can make you trip. What can I do with my stairs? Do not  leave any items on the stairs. Make sure that there are handrails on both sides of the stairs and use them. Fix handrails that are broken or loose. Make sure that handrails are as long as the stairways. Check any carpeting to make sure that it is firmly attached to the stairs. Fix any carpet that is loose or worn. Avoid having throw rugs at the top or bottom of the stairs. If you do have throw rugs, attach them to the floor with carpet tape. Make sure that you have a light switch at the top of the stairs and the bottom of the stairs. If you do not have them, ask someone to add them for you. What else can I do to help prevent falls? Wear shoes that: Do not have high  heels. Have rubber bottoms. Are comfortable and fit you well. Are closed at the toe. Do not wear sandals. If you use a stepladder: Make sure that it is fully opened. Do not climb a closed stepladder. Make sure that both sides of the stepladder are locked into place. Ask someone to hold it for you, if possible. Clearly mark and make sure that you can see: Any grab bars or handrails. First and last steps. Where the edge of each step is. Use tools that help you move around (mobility aids) if they are needed. These include: Canes. Walkers. Scooters. Crutches. Turn on the lights when you go into a dark area. Replace any light bulbs as soon as they burn out. Set up your furniture so you have a clear path. Avoid moving your furniture around. If any of your floors are uneven, fix them. If there are any pets around you, be aware of where they are. Review your medicines with your doctor. Some medicines can make you feel dizzy. This can increase your chance of falling. Ask your doctor what other things that you can do to help prevent falls. This information is not intended to replace advice given to you by your health care provider. Make sure you discuss any questions you have with your health care provider. Document Released: 11/09/2008 Document Revised: 06/21/2015 Document Reviewed: 02/17/2014 Elsevier Interactive Patient Education  2017 [employer].

## 2021-06-13 NOTE — Progress Notes (Signed)
Subjective:   [name] is a 76 y.o. female who presents for Medicare Annual (Subsequent) preventive examination. Virtual Visit via Telephone Note  I connected with  [name] on 06/13/21 at  8:15 AM EDT by telephone and verified that I am speaking with the correct person using two identifiers.  Location: Patient: HOME Provider: [facility] Persons participating in the virtual visit: patient/Nurse Health Advisor   I discussed the limitations, risks, security and privacy concerns of performing an evaluation and management service by telephone and the availability of in person appointments. The patient expressed understanding and agreed to proceed.  Interactive audio and video telecommunications were attempted between this nurse and patient, however failed, due to patient having technical difficulties OR patient did not have access to video capability.  We continued and completed visit with audio only.  Some vital signs may be absent or patient reported.   Chriss Driver, LPN  Review of Systems     Cardiac Risk Factors include: advanced age (>8mn, >>27women);hypertension;dyslipidemia;sedentary lifestyle;obesity (BMI >30kg/m2)     Objective:    Today's Vitals   06/13/21 0816  Weight: 250 lb (113.4 kg)  Height: '5\' 4"'$  (1.626 m)   Body mass index is 42.91 kg/m.     06/13/2021    8:23 AM 09/04/2020   10:39 AM 05/31/2020    8:32 AM 12/05/2013   11:05 AM 07/14/2013   11:02 AM  Advanced Directives  Does Patient Have a Medical Advance Directive? No No No No Patient does not have advance directive;Patient would not like information  Would patient like information on creating a medical advance directive? No - Patient declined No - Patient declined     Pre-existing out of facility DNR order (yellow form or pink MOST form)     No    Current Medications (verified) Outpatient Encounter Medications as of 06/13/2021  Medication Sig   albuterol (VENTOLIN HFA) 108 (90 Base) MCG/ACT  inhaler INHALE 2 PUFFS BY MOUTH EVERY 4 HOURS AS NEEDED FOR WHEEZING FOR SHORTNESS OF BREATH   Ascorbic Acid (VITAMIN C GUMMIES PO) Take by mouth. Natures Made Vit C gummies   diltiazem (CARDIZEM CD) 360 MG 24 hr capsule Take 1 capsule by mouth once daily   doxazosin (CARDURA) 2 MG tablet Take 1 tablet by mouth once daily   empagliflozin (JARDIANCE) 10 MG TABS tablet Take 1 tablet (10 mg total) by mouth daily before breakfast.   fluticasone (FLONASE) 50 MCG/ACT nasal spray Place 2 sprays into both nostrils daily.   furosemide (LASIX) 40 MG tablet Take 1 tablet by mouth twice daily   lisinopril (ZESTRIL) 40 MG tablet Take 1 tablet by mouth once daily   meloxicam (MOBIC) 15 MG tablet Take 1 tablet (15 mg total) by mouth daily.   naproxen (NAPROSYN) 500 MG tablet TAKE 1 TABLET BY MOUTH TWICE DAILY AS NEEDED FOR MILD PAIN   omeprazole (PRILOSEC) 40 MG capsule Take 1 capsule by mouth once daily   Polyethylene Glycol 3350 (MIRALAX PO) Take by mouth as needed.   rosuvastatin (CRESTOR) 10 MG tablet Take 1 tablet by mouth once daily   tetrahydrozoline 0.05 % ophthalmic solution Place 1 drop into both eyes as needed (eye allergies).   triamcinolone (NASACORT) 55 MCG/ACT AERO nasal inhaler Place 2 sprays into the nose daily. 2 sprays each nostril at night   No facility-administered encounter medications on file as of 06/13/2021.    Allergies (verified) Aldactone [spironolactone]   History: Past Medical History:  Diagnosis Date   Allergy    Arthritis    arthritis -knees   Colon polyps    benign- last colonoscopy 09-2008   Depression    GERD (gastroesophageal reflux disease)    hx GERD   H/O seasonal allergies    no problems now   Hyperlipidemia    Hypertension    Murmur, heart    Spinal stenosis of lumbar region at multiple levels    Past Surgical History:  Procedure Laterality Date   ABDOMINAL HYSTERECTOMY     APPENDECTOMY     with gallbladder surgery   BUNIONECTOMY Bilateral     CHOLECYSTECTOMY     open-gallstones   COLONOSCOPY     EXCISION OF ADNEXAL MASS Left August 03, 2013   at [facility]/right adnexal nodule,excision;benigh lymph node   OOPHORECTOMY Bilateral 2008   ovarian cyst   POLYPECTOMY     Family History  Problem Relation Age of Onset   Colon cancer Mother 67   Heart attack Father    Breast cancer Paternal Aunt    Colon cancer Brother 89   Colon polyps Brother    Esophageal cancer Neg Hx    Pancreatic cancer Neg Hx    Rectal cancer Neg Hx    Stomach cancer Neg Hx    Social History   Socioeconomic History   Marital status: Married    Spouse name: Not on file   Number of children: Not on file   Years of education: Not on file   Highest education level: Not on file  Occupational History   Not on file  Tobacco Use   Smoking status: Never   Smokeless tobacco: Never  Vaping Use   Vaping Use: Never used  Substance and Sexual Activity   Alcohol use: No   Drug use: No   Sexual activity: Yes  Other Topics Concern   Not on file  Social History Narrative   Not on file   Social Determinants of Health   Financial Resource Strain: Low Risk    Difficulty of Paying Living Expenses: Not hard at all  Food Insecurity: No Food Insecurity   Worried About Charity fundraiser in the Last Year: Never true   [place] in the Last Year: Never true  Transportation Needs: No Transportation Needs   Lack of Transportation (Medical): No   Lack of Transportation (Non-Medical): No  Physical Activity: Insufficiently Active   Days of Exercise per Week: 3 days   Minutes of Exercise per Session: 30 min  Stress: Stress Concern Present   Feeling of Stress : To some extent  Social Connections: Engineer, building services of Communication with Friends and Family: More than three times a week   Frequency of Social Gatherings with Friends and Family: Twice a week   Attends Religious Services: More than 4 times per year   Active Member of [employer] or  Organizations: Yes   Attends Music therapist: More than 4 times per year   Marital Status: Married    Tobacco Counseling Counseling given: Not Answered   Clinical Intake:  Pre-visit preparation completed: Yes  Pain : No/denies pain     BMI - recorded: 42.91 Nutritional Status: BMI > 30  Obese Nutritional Risks: None Diabetes: No  How often do you need to have someone help you when you read instructions, pamphlets, or other written materials from your doctor or pharmacy?: 1 - Never  Diabetic?NO  Interpreter Needed?: No  Information entered  by :: mj Caiden Arteaga, lpn   Activities of Daily Living    06/13/2021    8:23 AM 09/04/2020   10:38 AM  In your present state of health, do you have any difficulty performing the following activities:  Hearing? 1 0  Vision? 0 0  Difficulty concentrating or making decisions? 0 0  Walking or climbing stairs? 0 1  Dressing or bathing? 0 0  Doing errands, shopping? 0 0  Preparing Food and eating ? N N  Using the Toilet? N N  In the past six months, have you accidently leaked urine? Y Y  Comment at times.   Do you have problems with loss of bowel control? Y N  Comment seen by Dr. Ardis Hughs. still w issues, appt made with Dr. Dennard Schaumann for 06/17/21.   Managing your Medications? N N  Managing your Finances? N N  Housekeeping or managing your Housekeeping? N N    Patient Care Team: [name], MD as PCP - General (Family Medicine) Werner Lean, MD as PCP - Cardiology (Cardiology) Edythe Clarity, Detar North as Pharmacist (Pharmacist)  Indicate any recent Medical Services you may have received from other than [facility] providers in the past year (date may be approximate).     Assessment:   This is a routine wellness examination for Brianne.  Hearing/Vision screen Hearing Screening - Comments:: Hearing issues.  Vision Screening - Comments:: Glasses. Dr. Herbert Deaner. 07/2020  Dietary issues and exercise activities  discussed: Current Exercise Habits: Home exercise routine, Type of exercise: walking, Time (Minutes): 30, Frequency (Times/Week): 3, Weekly Exercise (Minutes/Week): 90, Intensity: Mild, Exercise limited by: cardiac condition(s)   Goals Addressed             This Visit's Progress    Exercise 3x per week (30 min per time)   Not on track    Have 3 meals a day   On track    Prevent falls   On track      Depression Screen    06/13/2021    8:19 AM 09/04/2020   10:37 AM 08/10/2020   11:09 AM 05/31/2020    8:37 AM 05/04/2018    9:36 AM 02/10/2017    3:54 PM 11/03/2016    9:43 AM  PHQ 2/9 Scores  PHQ - 2 Score 2 3 0 4 0 1 2  PHQ- 9 Score '3 14  10  3     '$ Fall Risk    06/13/2021    8:23 AM 09/04/2020   10:37 AM 08/10/2020   11:09 AM 05/31/2020    8:35 AM 05/04/2018    9:36 AM  Fall Risk   Falls in the past year? 0 0 0 0 0  Number falls in past yr: 0 0 0 0   Injury with Fall? 0 0 0 0   Risk for fall due to : No Fall Risks Impaired balance/gait  Impaired balance/gait   Follow up Falls prevention discussed Falls evaluation completed  Falls evaluation completed;Falls prevention discussed Falls evaluation completed    FALL RISK PREVENTION PERTAINING TO THE HOME:  Any stairs in or around the home? Yes  If so, are there any without handrails? No  Home free of loose throw rugs in walkways, pet beds, electrical cords, etc? Yes  Adequate lighting in your home to reduce risk of falls? Yes   ASSISTIVE DEVICES UTILIZED TO PREVENT FALLS:  Life alert? No  Use of a cane, walker or w/c? Yes  Grab bars in the bathroom?  No  Shower chair or bench in shower? No  Elevated toilet seat or a handicapped toilet? No   TIMED UP AND GO:  Was the test performed? No .  Phone visit.  Cognitive Function:        06/13/2021    8:28 AM 05/31/2020    8:46 AM  6CIT Screen  What Year? 0 points 0 points  What month? 0 points 0 points  What time? 0 points 0 points  Count back from 20 0 points 0 points  Months  in reverse 0 points 0 points  Repeat phrase 0 points 0 points  Total Score 0 points 0 points    Immunizations Immunization History  Administered Date(s) Administered   Fluad Quad(high Dose 65+) 10/06/2018, 11/07/2019, 11/14/2020   Influenza, High Dose Seasonal PF 12/03/2017   Influenza,inj,Quad PF,6+ Mos 11/06/2015, 11/03/2016   PFIZER(Purple Top)SARS-COV-2 Vaccination 02/15/2019, 03/07/2019, 12/05/2019   Pneumococcal Conjugate-13 10/20/2014   Pneumococcal Polysaccharide-23 06/17/2012    TDAP status: Due, Education has been provided regarding the importance of this vaccine. Advised may receive this vaccine at local pharmacy or Health Dept. Aware to provide a copy of the vaccination record if obtained from local pharmacy or Health Dept. Verbalized acceptance and understanding.  Flu Vaccine status: Up to date  Pneumococcal vaccine status: Up to date  Covid-19 vaccine status: Completed vaccines  Qualifies for Shingles Vaccine? Yes   Zostavax completed No   Shingrix Completed?: No.    Education has been provided regarding the importance of this vaccine. Patient has been advised to call insurance company to determine out of pocket expense if they have not yet received this vaccine. Advised may also receive vaccine at local pharmacy or Health Dept. Verbalized acceptance and understanding.  Screening Tests Health Maintenance  Topic Date Due   COVID-19 Vaccine (4 - Booster for Pfizer series) 06/29/2021 (Originally 01/30/2020)   Zoster Vaccines- Shingrix (1 of 2) 07/15/2021 (Originally 06/11/1997)   TETANUS/TDAP  08/10/2021 (Originally [birth year]/1966)   INFLUENZA VACCINE  08/27/2021   MAMMOGRAM  04/27/2022   COLONOSCOPY (Pts 45-23yr Insurance coverage will need to be confirmed)  12/15/2023   Pneumonia Vaccine 74 Years old  Completed   DEXA SCAN  Completed   Hepatitis C Screening  Completed   HPV VACCINES  Aged Out    Health Maintenance  There are no preventive care reminders to display  for this patient.   Colorectal cancer screening: Type of screening: Colonoscopy. Completed 12/15/2018. Repeat every 5 years  Mammogram status: Completed 04/26/2021. Repeat every year  Bone Density status: Completed 09/01/2016. Results reflect: Bone density results: NORMAL. Repeat every 2 years.  Lung Cancer Screening: (Low Dose CT Chest recommended if Age 74-80years, 30 pack-year currently smoking OR have quit w/in 15years.) does not qualify.   Additional Screening:  Hepatitis C Screening: does qualify; Completed 05/04/2018  Vision Screening: Recommended annual ophthalmology exams for early detection of glaucoma and other disorders of the eye. Is the patient up to date with their annual eye exam?  Yes  Who is the provider or what is the name of the office in which the patient attends annual eye exams? Dr. HHerbert DeanerIf pt is not established with a provider, would they like to be referred to a provider to establish care? No .   Dental Screening: Recommended annual dental exams for proper oral hygiene  Community Resource Referral / Chronic Care Management: CRR required this visit?  No   CCM required this visit?  Yes  Plan:     I have personally reviewed and noted the following in the patient's chart:   Medical and social history Use of alcohol, tobacco or illicit drugs  Current medications and supplements including opioid prescriptions.  Functional ability and status Nutritional status Physical activity Advanced directives List of other physicians Hospitalizations, surgeries, and ER visits in previous 12 months Vitals Screenings to include cognitive, depression, and falls Referrals and appointments  In addition, I have reviewed and discussed with patient certain preventive protocols, quality metrics, and best practice recommendations. A written personalized care plan for preventive services as well as general preventive health recommendations were provided to patient.      Chriss Driver, LPN   02/23/7865   Nurse Notes: Pt with c/o depression. Offered CCM referral for counseling services. Pt is agreeable and message sent to Dr. Dennard Schaumann for referral to be made. Pt also c/o issues with mucous in bowel movements x 2 months. Offered appt to see Dr. Dennard Schaumann. Pt is agreeable and appt made for Monday 06/17/21 @ 12 pm.

## 2021-06-13 NOTE — Telephone Encounter (Signed)
Pt c/o increased depression and life stress. Discussed counseling referral. Pt is agreeable. Can you enter a CCM referral for me? Thank you.

## 2021-06-14 ENCOUNTER — Other Ambulatory Visit: Payer: Self-pay | Admitting: Family Medicine

## 2021-06-17 ENCOUNTER — Ambulatory Visit (INDEPENDENT_AMBULATORY_CARE_PROVIDER_SITE_OTHER): Payer: Medicare HMO | Admitting: Family Medicine

## 2021-06-17 VITALS — BP 120/78 | HR 82 | Temp 98.5°F | Ht 64.0 in | Wt 254.8 lb

## 2021-06-17 DIAGNOSIS — R194 Change in bowel habit: Secondary | ICD-10-CM

## 2021-06-17 NOTE — Progress Notes (Signed)
Subjective:    Patient ID: [name], female    DOB: Oct 03, 1945, 76 y.o.   MRN: [id number]  HPI Patient is a very pleasant 76 year old African-American female who would like a referral for colonoscopy.  She has a strong family history of colon cancer.  Her last colonoscopy was 3 years ago and was reassuring.  Her gastroenterologist recommended a repeat colonoscopy in 5 years so she is not yet due.  However over the last 6 months, she states that she has had a change in bowel habits.  She is having fecal incontinence.  She states that is soon as she feels like she has the urge to defecate, she will have leakage of loose stool.  She is unable to hold her bowels. She denies any melena or hematochezia.  She denies any fevers or chills or weight loss.  She denies any abdominal pain.  She had a CT scan in December of last year that was normal.   Past Medical History:  Diagnosis Date   Allergy    Arthritis    arthritis -knees   Colon polyps    benign- last colonoscopy 09-2008   Depression    GERD (gastroesophageal reflux disease)    hx GERD   H/O seasonal allergies    no problems now   Hyperlipidemia    Hypertension    Murmur, heart    Spinal stenosis of lumbar region at multiple levels    Past Surgical History:  Procedure Laterality Date   ABDOMINAL HYSTERECTOMY     APPENDECTOMY     with gallbladder surgery   BUNIONECTOMY Bilateral    CHOLECYSTECTOMY     open-gallstones   COLONOSCOPY     EXCISION OF ADNEXAL MASS Left August 03, 2013   at [facility]/right adnexal nodule,excision;benigh lymph node   OOPHORECTOMY Bilateral 2008   ovarian cyst   POLYPECTOMY     Current Outpatient Medications on File Prior to Visit  Medication Sig Dispense Refill   albuterol (VENTOLIN HFA) 108 (90 Base) MCG/ACT inhaler INHALE 2 PUFFS BY MOUTH EVERY 4 HOURS AS NEEDED FOR WHEEZING FOR SHORTNESS OF BREATH 18 g 0   Ascorbic Acid (VITAMIN C GUMMIES PO) Take by mouth. Natures Made Vit C gummies      diltiazem (CARDIZEM CD) 360 MG 24 hr capsule Take 1 capsule by mouth once daily 90 capsule 0   doxazosin (CARDURA) 2 MG tablet Take 1 tablet by mouth once daily 90 tablet 1   empagliflozin (JARDIANCE) 10 MG TABS tablet Take 1 tablet (10 mg total) by mouth daily before breakfast. 30 tablet 11   fluticasone (FLONASE) 50 MCG/ACT nasal spray Place 2 sprays into both nostrils daily. 16 g 6   furosemide (LASIX) 40 MG tablet Take 1 tablet by mouth twice daily 180 tablet 3   lisinopril (ZESTRIL) 40 MG tablet Take 1 tablet by mouth once daily 90 tablet 2   meloxicam (MOBIC) 15 MG tablet Take 1 tablet (15 mg total) by mouth daily. 30 tablet 0   naproxen (NAPROSYN) 500 MG tablet TAKE 1 TABLET BY MOUTH TWICE DAILY AS NEEDED FOR MILD PAIN 180 tablet 0   omeprazole (PRILOSEC) 40 MG capsule Take 1 capsule by mouth once daily 90 capsule 3   Polyethylene Glycol 3350 (MIRALAX PO) Take by mouth as needed.     rosuvastatin (CRESTOR) 10 MG tablet Take 1 tablet by mouth once daily 90 tablet 0   tetrahydrozoline 0.05 % ophthalmic solution Place 1 drop into both eyes  as needed (eye allergies).     triamcinolone (NASACORT) 55 MCG/ACT AERO nasal inhaler Place 2 sprays into the nose daily. 2 sprays each nostril at night 1 each 12   No current facility-administered medications on file prior to visit.   Allergies  Allergen Reactions   Aldactone [Spironolactone] Rash   Social History   Socioeconomic History   Marital status: Married    Spouse name: Not on file   Number of children: Not on file   Years of education: Not on file   Highest education level: Not on file  Occupational History   Not on file  Tobacco Use   Smoking status: Never   Smokeless tobacco: Never  Vaping Use   Vaping Use: Never used  Substance and Sexual Activity   Alcohol use: No   Drug use: No   Sexual activity: Yes  Other Topics Concern   Not on file  Social History Narrative   Not on file   Social Determinants of Health    Financial Resource Strain: Low Risk    Difficulty of Paying Living Expenses: Not hard at all  Food Insecurity: No Food Insecurity   Worried About Charity fundraiser in the Last Year: Never true   [place] in the Last Year: Never true  Transportation Needs: No Transportation Needs   Lack of Transportation (Medical): No   Lack of Transportation (Non-Medical): No  Physical Activity: Insufficiently Active   Days of Exercise per Week: 3 days   Minutes of Exercise per Session: 30 min  Stress: Stress Concern Present   Feeling of Stress : To some extent  Social Connections: Engineer, building services of Communication with Friends and Family: More than three times a week   Frequency of Social Gatherings with Friends and Family: Twice a week   Attends Religious Services: More than 4 times per year   Active Member of [employer] or Organizations: Yes   Attends Music therapist: More than 4 times per year   Marital Status: Married  Human resources officer Violence: Not At Risk   Fear of Current or Ex-Partner: No   Emotionally Abused: No   Physically Abused: No   Sexually Abused: No   Family History  Problem Relation Age of Onset   Colon cancer Mother 40   Heart attack Father    Breast cancer Paternal Aunt    Colon cancer Brother 76   Colon polyps Brother    Esophageal cancer Neg Hx    Pancreatic cancer Neg Hx    Rectal cancer Neg Hx    Stomach cancer Neg Hx       Review of Systems  All other systems reviewed and are negative.     Objective:   Physical Exam Vitals reviewed.  Constitutional:      General: She is not in acute distress.    Appearance: Normal appearance. She is normal weight. She is not ill-appearing, toxic-appearing or diaphoretic.  HENT:     Head: Normocephalic and atraumatic.  Cardiovascular:     Rate and Rhythm: Normal rate and regular rhythm.     Pulses: Normal pulses.     Heart sounds: Murmur heard.    No friction rub. No gallop.   Pulmonary:     Effort: Pulmonary effort is normal. No respiratory distress.     Breath sounds: Normal breath sounds. No stridor. No wheezing, rhonchi or rales.  Chest:     Chest wall: No tenderness.  Abdominal:  General: Bowel sounds are normal. There is no distension.     Palpations: Abdomen is soft. There is no mass.     Tenderness: There is no abdominal tenderness. There is no right CVA tenderness, left CVA tenderness, guarding or rebound.     Hernia: No hernia is present.  Musculoskeletal:        General: No swelling, deformity or signs of injury.     Right shoulder: Tenderness present. Decreased range of motion.     Cervical back: Normal range of motion and neck supple. No muscular tenderness.     Lumbar back: Tenderness present.     Right knee: Decreased range of motion. Tenderness present.     Left knee: Decreased range of motion. Tenderness present.     Right lower leg: No edema.     Left lower leg: No edema.  Skin:    General: Skin is warm.     Coloration: Skin is not jaundiced or pale.     Findings: No bruising, erythema, lesion or rash.  Neurological:     General: No focal deficit present.     Mental Status: She is alert and oriented to person, place, and time. Mental status is at baseline.     Cranial Nerves: No cranial nerve deficit.     Sensory: No sensory deficit.     Motor: No weakness.     Coordination: Coordination normal.     Gait: Gait normal.     Deep Tendon Reflexes: Reflexes normal.          Assessment & Plan:  Change in bowel habits - Plan: Ambulatory referral to Gastroenterology I have recommended that we try adding a fiber supplement like Metamucil.  If that does not work we can even add cholestyramine to try to increase transit time and decrease fecal incontinence.  Patient would like to do that.  However she still would like to see her gastroenterologist because she is very concerned based on her family history.  Therefore I will consult with  GI and they can determine necessity of a diagnostic colonoscopy

## 2021-06-17 NOTE — Addendum Note (Signed)
Addended by: Randal Buba K on: 06/17/2021 04:05 PM   Modules accepted: Orders

## 2021-06-18 ENCOUNTER — Ambulatory Visit (INDEPENDENT_AMBULATORY_CARE_PROVIDER_SITE_OTHER): Payer: Medicare HMO | Admitting: *Deleted

## 2021-06-18 ENCOUNTER — Telehealth: Payer: Medicare HMO | Admitting: *Deleted

## 2021-06-18 ENCOUNTER — Telehealth: Payer: Self-pay | Admitting: *Deleted

## 2021-06-18 DIAGNOSIS — M1990 Unspecified osteoarthritis, unspecified site: Secondary | ICD-10-CM

## 2021-06-18 DIAGNOSIS — F320 Major depressive disorder, single episode, mild: Secondary | ICD-10-CM

## 2021-06-18 NOTE — Chronic Care Management (AMB) (Signed)
  Chronic Care Management Note  06/18/2021 Name: [name] MRN: [id number] DOB: 07/12/1945  [name] is a 76 y.o. year old female who is a primary care patient of [name], MD and is actively engaged with the care management team. I reached out to Odie Sera by phone today to assist with scheduling an initial visit with the Licensed Clinical Social Worker  Follow up plan: Unsuccessful telephone outreach attempt made. A HIPAA compliant phone message was left for the patient providing contact information and requesting a return call.  The care management team will reach out to the patient again over the next 3-5 days.  If patient returns call to provider office, please advise to call [place] at [facility]  Direct Dial: [phone]

## 2021-06-18 NOTE — Chronic Care Management (AMB) (Signed)
Chronic Care Management    Clinical Social Work Note  06/18/2021 Name: [name] MRN: [id number] DOB: 06-24-1945  [name] is a 76 y.o. year old female who is a primary care patient of Pickard, [name], MD. The CCM team was consulted to assist the patient with chronic disease management and/or care coordination needs related to: [employer] , Mental Health Counseling and Resources, and Grief Counseling.   Engaged with patient by telephone for initial visit in response to provider referral for social work chronic care management and care coordination services.   Consent to Services:  The patient was given information about Chronic Care Management services, agreed to services, and gave verbal consent prior to initiation of services.  Please see initial visit note for detailed documentation.   Patient agreed to services and consent obtained.   Assessment: Review of patient past medical history, allergies, medications, and health status, including review of relevant consultants reports was performed today as part of a comprehensive evaluation and provision of chronic care management and care coordination services.     SDOH (Social Determinants of Health) assessments and interventions performed:  SDOH Interventions    Flowsheet Row Most Recent Value  SDOH Interventions   Social Connections Interventions [employer], Other (Comment)  Depression Interventions/Treatment  Counseling        Advanced Directives Status: See Care Plan for related entries.  CCM Care Plan  Allergies  Allergen Reactions   Aldactone [Spironolactone] Rash    Outpatient Encounter Medications as of 06/18/2021  Medication Sig   albuterol (VENTOLIN HFA) 108 (90 Base) MCG/ACT inhaler INHALE 2 PUFFS BY MOUTH EVERY 4 HOURS AS NEEDED FOR WHEEZING FOR SHORTNESS OF BREATH   Ascorbic Acid (VITAMIN C GUMMIES PO) Take by mouth. Natures Made Vit C gummies   diltiazem (CARDIZEM CD) 360 MG 24 hr capsule Take 1  capsule by mouth once daily   doxazosin (CARDURA) 2 MG tablet Take 1 tablet by mouth once daily   empagliflozin (JARDIANCE) 10 MG TABS tablet Take 1 tablet (10 mg total) by mouth daily before breakfast.   fluticasone (FLONASE) 50 MCG/ACT nasal spray Place 2 sprays into both nostrils daily.   furosemide (LASIX) 40 MG tablet Take 1 tablet by mouth twice daily   lisinopril (ZESTRIL) 40 MG tablet Take 1 tablet by mouth once daily   meloxicam (MOBIC) 15 MG tablet Take 1 tablet (15 mg total) by mouth daily.   naproxen (NAPROSYN) 500 MG tablet TAKE 1 TABLET BY MOUTH TWICE DAILY AS NEEDED FOR MILD PAIN   omeprazole (PRILOSEC) 40 MG capsule Take 1 capsule by mouth once daily   Polyethylene Glycol 3350 (MIRALAX PO) Take by mouth as needed.   rosuvastatin (CRESTOR) 10 MG tablet Take 1 tablet by mouth once daily   tetrahydrozoline 0.05 % ophthalmic solution Place 1 drop into both eyes as needed (eye allergies).   triamcinolone (NASACORT) 55 MCG/ACT AERO nasal inhaler Place 2 sprays into the nose daily. 2 sprays each nostril at night   No facility-administered encounter medications on file as of 06/18/2021.    Patient Active Problem List   Diagnosis Date Noted   Pincer nail deformity 04/16/2021   Diverticulitis 12/27/2020   History of hysterectomy 12/27/2020   Morbid obesity ([place]) 03/23/2020   Heart failure with preserved ejection fraction ([place]) 12/27/2019   Bilateral knee pain 02/15/2019   Spinal stenosis of lumbar region at multiple levels    Arthropathia 09/23/2013   Benign neoplasm of colon 09/23/2013  Essential (primary) hypertension 09/23/2013   SBO (small bowel obstruction) ([place]) 08/17/2013   Pelvic mass 07/05/2013   Pain in joint, pelvic region and thigh 07/05/2013   Hyperlipidemia    Arthritis    Colon polyps     Conditions to be addressed/monitored: Depression; Mental Health Concerns  and Social Isolation  Care Plan : LCSW Plan of Care  Updates made by Deirdre Peer, LCSW  since 06/18/2021 12:00 AM     Problem: Depression      Long-Range Goal: Improve quality of life and reduce depression symptoms   Start Date: 06/18/2021  Expected End Date: 09/25/2021  This Visit's Progress: On track  Priority: High  Note:   Current Barriers:  Transportation, Mental Health Concerns , and Lacks knowledge of community resource:    No Advanced Directives in place [place] knowledge of how to connect   [place]):  Patient  will patient will work with SW to address concerns related to depression, grief/loss and re-connecting/socialization  through collaboration with [place] representative, provider, and care team.   Interventions: CSW made initial outreach call to pt today to assess reported depression. Pt admits to dealing with lack of interest, poor PO intake and even some "hopelessness". Pt denies SI/HI. She reports no history of medication or hospitalization for depression. Pt shared she lost a son about 5 years ago; "Quita Skye passed away in 31-Aug-2016".  CSW allowed pt to share and express her grief and loss. Pt shared she went to [facility]ospice's bereavement program a few years ago and it was helpful. She is interested in seeking counseling and CSW will seek options in-network for her.  Pt is also interested in pursuing some community based activities; discussed the Radio producer, volunteer work and Visual merchandiser as opportunities to consider. Pt seems interested and also may get re-connected with [employer].  Pt does not have a HCPOA- will have packet mailed to her to review and complete as she desires.    1:1 collaboration with primary care provider regarding development and update of comprehensive plan of care as evidenced by provider attestation and co-signature Inter-disciplinary care team collaboration (see longitudinal plan of care) Evaluation of current treatment plan related to  self management and patient's adherence to plan as established by  provider Review resources, discussed options and provided patient information about  Community food options  Referral to care guide (food pantry sites,  Advertising account planner, Senior activity programs, Tax adviser,)    Mental Health:  (Status: New goal.) Evaluation of current treatment plan related to Depression: depressed mood loss of energy/fatigue decreased appetite and Grief Motivational Interviewing employed Depression screen reviewed  PHQ2/ PHQ9 completed Solution-Focused Strategies employed:  Active listening / Reflection utilized  Problem [place] strategies reviewed  and  Social Determinants of Health in Patient with Depression: depressed mood hopelessness and Grief:  (Status: New goal.) SDOH assessments completed: Food Insecurity , Social Connections, Stress, and Physical Activity Evaluation of current treatment plan related to unmet needs Community food options (sites), Referral to care guide (food pantry sites,  Advertising account planner, Senior activity programs, Tax adviser,) ) , Solution-Focused Strategies employed: , and Engineer, petroleum Provided  Task & activities to accomplish goals: Continue with compliance of taking medication prescribed by Doctor Continue with your self-care action plan (seeking community activities; senior center, [employer], church, etc) Referral placed for counseling and they will reach out to you  Follow Up Plan: Appointment scheduled for SW follow up with client by phone on: 07/16/21      Eduard Clos MSW, [place] Licensed Clinical Social Worker [place]    [phone]

## 2021-06-18 NOTE — Patient Instructions (Signed)
Visit Information  Thank you for taking time to visit with me today. Please don't hesitate to contact me if I can be of assistance to you before our next scheduled telephone appointment.  Our next appointment is by telephone on 07/16/21   Please call the care guide team at [phone] if you need to cancel or reschedule your appointment.   If you are experiencing a Mental Health or [place] or need someone to talk to, please call 911   The patient verbalized understanding of instructions, educational materials, and care plan provided today and DECLINED offer to receive copy of patient instructions, educational materials, and care plan.   Eduard Clos MSW, LCSW Licensed Clinical Social Worker [place]    [phone]

## 2021-06-18 NOTE — Chronic Care Management (AMB) (Signed)
  Chronic Care Management Note  06/18/2021 Name: [name] MRN: [id number] DOB: 07-14-1945  [name] is a 76 y.o. year old female who is a primary care patient of Dennard Schaumann, [name], MD and is actively engaged with the care management team. I reached out to Odie Sera by phone today to assist with scheduling an initial visit with the Licensed Clinical Social Worker   [name] was given information about Chronic Care Management services today including:  CCM service includes personalized support from designated clinical staff supervised by her physician, including individualized plan of care and coordination with other care providers 24/7 contact phone numbers for assistance for urgent and routine care needs. Service will only be billed when office clinical staff spend 20 minutes or more in a month to coordinate care. Only one practitioner may furnish and bill the service in a calendar month. The patient may stop CCM services at any time (effective at the end of the month) by phone call to the office staff. The patient is responsible for co-pay (up to 20% after annual deductible is met) if co-pay is required by the individual health plan.   Patient agreed to services and verbal consent obtained.     SIGNATURE  Follow up plan: Telephone appointment with care management team member scheduled for:06/18/21  [place] [facility]  Direct Dial: [phone]

## 2021-06-19 ENCOUNTER — Telehealth: Payer: Self-pay

## 2021-06-19 ENCOUNTER — Other Ambulatory Visit: Payer: Self-pay | Admitting: *Deleted

## 2021-06-19 DIAGNOSIS — F320 Major depressive disorder, single episode, mild: Secondary | ICD-10-CM

## 2021-06-19 NOTE — Telephone Encounter (Signed)
   Telephone encounter was:  Successful.  06/19/2021 Name: [name] MRN: [id number] DOB: 12/14/1945  [name] is a 76 y.o. year old female who is a primary care patient of Pickard, [name], MD . The community resource team was consulted for assistance with Food Insecurity and senior activities, volunteer, advance directive packet.  Care guide performed the following interventions: Spoke with patient confirmed resources needed and inquired if there were other resources needed she responded no.  Ms. Harkey asked that I mail resources to her home for food pantries, Tax adviser, Glass blower/designer, Emergency planning/management officer.  Letter saved in Epic.  Follow Up Plan:  Care guide will follow up with patient by phone over the next 7 days  Abdimalik Mayorquin, AAS Paralegal, [place] [facility]  [address], [place] [address] ??[email or web address]  ?? [phone]   [email or web address]

## 2021-06-20 ENCOUNTER — Other Ambulatory Visit: Payer: Self-pay | Admitting: Family Medicine

## 2021-06-24 NOTE — Telephone Encounter (Signed)
Requested Prescriptions  Pending Prescriptions Disp Refills  . naproxen (NAPROSYN) 500 MG tablet [Pharmacy Med Name: Naproxen 500 MG Oral Tablet] 180 tablet 0    Sig: TAKE 1 TABLET BY MOUTH TWICE DAILY AS NEEDED FOR MILD PAIN     Analgesics:  NSAIDS Failed - 06/20/2021 12:43 PM      Failed - Manual Review: Labs are only required if the patient has taken medication for more than 8 weeks.      Failed - Cr in normal range and within 360 days    Creat  Date Value Ref Range Status  08/31/2020 0.99 0.60 - 1.00 mg/dL Final   Creatinine, Ser  Date Value Ref Range Status  04/11/2021 1.03 (H) 0.57 - 1.00 mg/dL Final         Passed - HGB in normal range and within 360 days    Hemoglobin  Date Value Ref Range Status  12/31/2020 12.8 12.0 - 15.0 g/dL Final  12/27/2019 13.3 11.1 - 15.9 g/dL Final         Passed - PLT in normal range and within 360 days    Platelets  Date Value Ref Range Status  12/31/2020 209.0 150.0 - 400.0 K/uL Final  12/27/2019 202 150 - 450 x10E3/uL Final         Passed - HCT in normal range and within 360 days    HCT  Date Value Ref Range Status  12/31/2020 39.3 36.0 - 46.0 % Final   Hematocrit  Date Value Ref Range Status  12/27/2019 38.9 34.0 - 46.6 % Final         Passed - eGFR is 30 or above and within 360 days    GFR, Est African American  Date Value Ref Range Status  11/07/2019 79 > OR = 60 mL/min/1.60m Final   GFR calc Af Amer  Date Value Ref Range Status  01/04/2020 62 >59 mL/min/1.73 Final    Comment:    **In accordance with recommendations from the NKF-ASN Task force,**   [facility] is in the process of updating its eGFR calculation to the   2021 CKD-EPI creatinine equation that estimates kidney function   without a race variable.    GFR, Est Non African American  Date Value Ref Range Status  11/07/2019 68 > OR = 60 mL/min/1.741mFinal   GFR calc non Af Amer  Date Value Ref Range Status  01/04/2020 54 (L) >59 mL/min/1.73 Final   GFR   Date Value Ref Range Status  12/31/2020 53.92 (L) >60.00 mL/min Final    Comment:    Calculated using the CKD-EPI Creatinine Equation (2021)   eGFR  Date Value Ref Range Status  04/11/2021 57 (L) >59 mL/min/1.73 Final         Passed - Patient is not pregnant      Passed - Valid encounter within last 12 months    Recent Outpatient Visits          1 week ago Change in bowel habits   [facility] [place]gee[facility]   2 months ago Ingrown thumb nail, right   [facility] [place] [facility]gee[place]   9 months ago General medical exam   [facility]ace[facility]y FrizzleMD   10 months ago Sinus pressure   [facility]ulogio BearNP   1 year ago Spinal stenosis of lumbar region at multiple levels   Br[facility]okes[facility]iSusy Frizzle  MD

## 2021-06-26 DIAGNOSIS — F32A Depression, unspecified: Secondary | ICD-10-CM

## 2021-06-27 ENCOUNTER — Telehealth: Payer: Self-pay

## 2021-06-27 NOTE — Telephone Encounter (Signed)
   Telephone encounter was:  Unsuccessful.  06/27/2021 Name: [name] MRN: [id number] DOB: 15-Nov-1945  Unsuccessful outbound call made today to assist with:   food pantries, volunteer, advance directive packet.  Outreach Attempt:  2nd Attempt  A HIPAA compliant voice message was left requesting a return call.  Instructed patient to call back at [phone]. Left message on voicemail for patient to return my call regarding mailed resources.  Skyra Crichlow, AAS Paralegal, [facility]  [address], [place] [address] ??[name]  ?? [phone]   [email or web address]

## 2021-07-01 ENCOUNTER — Telehealth: Payer: Self-pay

## 2021-07-01 NOTE — Telephone Encounter (Signed)
   Telephone encounter was:  Successful.  07/01/2021 Name: [name] MRN: [id number] DOB: 06-14-1945  [name] is a 76 y.o. year old female who is a primary care patient of Pickard, [name], MD . The community resource team was consulted for assistance with Food, senior activities, volunteer, advance directive packet.  Care guide performed the following interventions: Spoke with patient, she has received the mailed resources. No further resources needed at this time.   Follow Up Plan:  No further follow up planned at this time. The patient has been provided with needed resources.  Quetzali Heinle, AAS Paralegal, [place] [facility]  [address], [place] [address] ??[email or web address]  ?? [phone]   [email or web address]

## 2021-07-02 ENCOUNTER — Other Ambulatory Visit: Payer: Self-pay | Admitting: Internal Medicine

## 2021-07-16 ENCOUNTER — Ambulatory Visit (INDEPENDENT_AMBULATORY_CARE_PROVIDER_SITE_OTHER): Payer: Medicare HMO | Admitting: *Deleted

## 2021-07-16 DIAGNOSIS — I10 Essential (primary) hypertension: Secondary | ICD-10-CM

## 2021-07-16 DIAGNOSIS — F320 Major depressive disorder, single episode, mild: Secondary | ICD-10-CM

## 2021-07-16 NOTE — Patient Instructions (Signed)
Visit Information  Thank you for taking time to visit with me today. Please don't hesitate to contact me if I can be of assistance to you before our next scheduled telephone appointment.  Our next appointment is by telephone on 08/06/21    Please call the care guide team at [phone] if you need to cancel or reschedule your appointment.   If you are experiencing a Mental Health or [place] or need someone to talk to, please call 911   The patient verbalized understanding of instructions, educational materials, and care plan provided today and DECLINED offer to receive copy of patient instructions, educational materials, and care plan.   Eduard Clos MSW, LCSW Licensed Clinical Social Worker [place]    [phone]

## 2021-07-16 NOTE — Chronic Care Management (AMB) (Signed)
Chronic Care Management    Clinical Social Work Note  07/16/2021 Name: [name] MRN: [id number] DOB: 11/03/1945  [name] is a 76 y.o. year old female who is a primary care patient of Pickard, [name], MD. The CCM team was consulted to assist the patient with chronic disease management and/or care coordination needs related to: [employer] , Mental Health Counseling and Resources, and Grief Counseling.   Engaged with patient by telephone for follow up visit in response to provider referral for social work chronic care management and care coordination services.   Consent to Services:  The patient was given information about Chronic Care Management services, agreed to services, and gave verbal consent prior to initiation of services.  Please see initial visit note for detailed documentation.   Patient agreed to services and consent obtained.   Assessment: Review of patient past medical history, allergies, medications, and health status, including review of relevant consultants reports was performed today as part of a comprehensive evaluation and provision of chronic care management and care coordination services.     SDOH (Social Determinants of Health) assessments and interventions performed:    Advanced Directives Status: Not addressed in this encounter.  CCM Care Plan  Allergies  Allergen Reactions   Aldactone [Spironolactone] Rash    Outpatient Encounter Medications as of 07/16/2021  Medication Sig   albuterol (VENTOLIN HFA) 108 (90 Base) MCG/ACT inhaler INHALE 2 PUFFS BY MOUTH EVERY 4 HOURS AS NEEDED FOR WHEEZING FOR SHORTNESS OF BREATH   Ascorbic Acid (VITAMIN C GUMMIES PO) Take by mouth. Natures Made Vit C gummies   diltiazem (CARDIZEM CD) 360 MG 24 hr capsule Take 1 capsule by mouth once daily   doxazosin (CARDURA) 2 MG tablet Take 1 tablet by mouth once daily   empagliflozin (JARDIANCE) 10 MG TABS tablet Take 1 tablet (10 mg total) by mouth daily before  breakfast.   fluticasone (FLONASE) 50 MCG/ACT nasal spray Place 2 sprays into both nostrils daily.   furosemide (LASIX) 40 MG tablet Take 1 tablet by mouth twice daily   lisinopril (ZESTRIL) 40 MG tablet Take 1 tablet by mouth once daily   meloxicam (MOBIC) 15 MG tablet Take 1 tablet (15 mg total) by mouth daily.   naproxen (NAPROSYN) 500 MG tablet TAKE 1 TABLET BY MOUTH TWICE DAILY AS NEEDED FOR MILD PAIN   omeprazole (PRILOSEC) 40 MG capsule Take 1 capsule by mouth once daily   Polyethylene Glycol 3350 (MIRALAX PO) Take by mouth as needed.   rosuvastatin (CRESTOR) 10 MG tablet Take 1 tablet by mouth once daily   tetrahydrozoline 0.05 % ophthalmic solution Place 1 drop into both eyes as needed (eye allergies).   triamcinolone (NASACORT) 55 MCG/ACT AERO nasal inhaler Place 2 sprays into the nose daily. 2 sprays each nostril at night   No facility-administered encounter medications on file as of 07/16/2021.    Patient Active Problem List   Diagnosis Date Noted   Pincer nail deformity 04/16/2021   Diverticulitis 12/27/2020   History of hysterectomy 12/27/2020   Morbid obesity ([place]) 03/23/2020   Heart failure with preserved ejection fraction ([place]) 12/27/2019   Bilateral knee pain 02/15/2019   Spinal stenosis of lumbar region at multiple levels    Arthropathia 09/23/2013   Benign neoplasm of colon 09/23/2013   Essential (primary) hypertension 09/23/2013   SBO (small bowel obstruction) ([place]) 08/17/2013   Pelvic mass 07/05/2013   Pain in joint, pelvic region and thigh 07/05/2013   Hyperlipidemia  Arthritis    Colon polyps     Conditions to be addressed/monitored: Depression; Mental Health Concerns   Care Plan : LCSW Plan of Care  Updates made by Deirdre Peer, LCSW since 07/16/2021 12:00 AM     Problem: Depression      Long-Range Goal: Improve quality of life and reduce depression symptoms   Start Date: 06/18/2021  Expected End Date: 09/25/2021  This Visit's Progress: On  track  Recent Progress: On track  Priority: High  Note:   Current Barriers:  Transportation, Mental Health Concerns , and Lacks knowledge of community resource:    No Advanced Directives in place [place] knowledge of how to connect   [place]):  Patient  will patient will work with SW to address concerns related to depression, grief/loss and re-connecting/socialization  through collaboration with [employer] representative, provider, and care team.   Interventions:  07/16/21-CSW spoke with pt for follow up today- she has received the paperwork from [facility] for outpatient counseling and plans to complete and take to them along with her insurance card. Pt also indicates she has received the resources mailed to her and plans to review this and consider-    CSW made initial outreach call to pt today to assess reported depression. Pt admits to dealing with lack of interest, poor PO intake and even some "hopelessness". Pt denies SI/HI. She reports no history of medication or hospitalization for depression. Pt shared she lost a son about 5 years ago; "[name] passed away in 09/02/16".  CSW allowed pt to share and express her grief and loss. Pt shared she [facility] program a few years ago and it was helpful. She is interested in seeking counseling and CSW will seek options in-network for her.  Pt is also interested in pursuing some community based activities; discussed the Radio producer, volunteer work and Visual merchandiser as opportunities to consider. Pt seems interested and also may get re-connected with [employer].  Pt does not have a HCPOA- will have packet mailed to her to review and complete as she desires.    1:1 collaboration with primary care provider regarding development and update of comprehensive plan of care as evidenced by provider attestation and co-signature Inter-disciplinary care team collaboration (see longitudinal plan of care) Evaluation of  current treatment plan related to  self management and patient's adherence to plan as established by provider Review resources, discussed options and provided patient information about  Community food options  Referral to care guide (food pantry sites,  Advertising account planner, Senior activity programs, Tax adviser,)    Mental Health:  (Status: New goal.) Evaluation of current treatment plan related to Depression: depressed mood loss of energy/fatigue decreased appetite and Grief Motivational Interviewing employed Depression screen reviewed  PHQ2/ PHQ9 completed Solution-Focused Strategies employed:  Active listening / Reflection utilized  Problem [place] strategies reviewed  and  Social Determinants of Health in Patient with Depression: depressed mood hopelessness and Grief:  (Status: New goal.) SDOH assessments completed: Food Insecurity , Social Connections, Stress, and Physical Activity Evaluation of current treatment plan related to unmet needs Community food options (sites), Referral to care guide (food pantry sites,  Advertising account planner, Senior activity programs, Tax adviser,) ) , Solution-Focused Strategies employed: , and Engineer, petroleum Provided  Task & activities to accomplish goals: Continue with compliance of taking medication prescribed by Doctor Continue with your self-care action plan (seeking community activities; senior center, [employer], church,  etc) Referral placed for counseling and they will reach out to you           Follow Up Plan: Appointment scheduled for SW follow up with client by phone on: 08/06/21      Eduard Clos MSW, [place] Licensed Clinical Social Worker [place]    [phone]

## 2021-07-19 ENCOUNTER — Other Ambulatory Visit: Payer: Self-pay | Admitting: Family Medicine

## 2021-07-19 DIAGNOSIS — J209 Acute bronchitis, unspecified: Secondary | ICD-10-CM

## 2021-07-26 ENCOUNTER — Ambulatory Visit (INDEPENDENT_AMBULATORY_CARE_PROVIDER_SITE_OTHER): Payer: Medicare HMO | Admitting: Family Medicine

## 2021-07-26 VITALS — BP 122/82 | HR 91 | Temp 98.3°F | Ht 64.0 in | Wt 257.0 lb

## 2021-07-26 DIAGNOSIS — F320 Major depressive disorder, single episode, mild: Secondary | ICD-10-CM

## 2021-07-26 DIAGNOSIS — I5032 Chronic diastolic (congestive) heart failure: Secondary | ICD-10-CM

## 2021-07-26 DIAGNOSIS — I10 Essential (primary) hypertension: Secondary | ICD-10-CM

## 2021-07-26 MED ORDER — POTASSIUM CHLORIDE CRYS ER 20 MEQ PO TBCR: 20.0000 meq | EXTENDED_RELEASE_TABLET | Freq: Every day | ORAL | 3 refills | Status: DC

## 2021-07-26 NOTE — Progress Notes (Signed)
Subjective:    Patient ID: [name], female    DOB: 05-30-1945, 76 y.o.   MRN: [id number]  HPI Patient is here today with swelling in her legs.  She has positive edema distal to the knees.  Her feet are swollen and puffy.  She denies any chest pain or shortness of breath.  She does have a history of congestive heart failure with preserved ejection fraction.  Cardiology recently started her on Jardiance to help address this.  She denies any orthopnea or paroxysmal nocturnal dyspnea. Past Medical History:  Diagnosis Date   Allergy    Arthritis    arthritis -knees   Colon polyps    benign- last colonoscopy 09-2008   Depression    GERD (gastroesophageal reflux disease)    hx GERD   H/O seasonal allergies    no problems now   Hyperlipidemia    Hypertension    Murmur, heart    Spinal stenosis of lumbar region at multiple levels    Past Surgical History:  Procedure Laterality Date   ABDOMINAL HYSTERECTOMY     APPENDECTOMY     with gallbladder surgery   BUNIONECTOMY Bilateral    CHOLECYSTECTOMY     open-gallstones   COLONOSCOPY     EXCISION OF ADNEXAL MASS Left August 03, 2013   at [facility]/right adnexal nodule,excision;benigh lymph node   OOPHORECTOMY Bilateral 2008   ovarian cyst   POLYPECTOMY     Current Outpatient Medications on File Prior to Visit  Medication Sig Dispense Refill   albuterol (VENTOLIN HFA) 108 (90 Base) MCG/ACT inhaler INHALE 2 PUFFS BY MOUTH EVERY 4 HOURS AS NEEDED FOR WHEEZING FOR SHORTNESS OF BREATH 18 g 0   Ascorbic Acid (VITAMIN C GUMMIES PO) Take by mouth. Natures Made Vit C gummies     diltiazem (CARDIZEM CD) 360 MG 24 hr capsule Take 1 capsule by mouth once daily 90 capsule 0   doxazosin (CARDURA) 2 MG tablet Take 1 tablet by mouth once daily 90 tablet 1   empagliflozin (JARDIANCE) 10 MG TABS tablet Take 1 tablet (10 mg total) by mouth daily before breakfast. 30 tablet 11   fluticasone (FLONASE) 50 MCG/ACT nasal spray Place 2 sprays into both  nostrils daily. 16 g 6   furosemide (LASIX) 40 MG tablet Take 1 tablet by mouth twice daily 180 tablet 3   lisinopril (ZESTRIL) 40 MG tablet Take 1 tablet by mouth once daily 90 tablet 2   naproxen (NAPROSYN) 500 MG tablet TAKE 1 TABLET BY MOUTH TWICE DAILY AS NEEDED FOR MILD PAIN 180 tablet 0   omeprazole (PRILOSEC) 40 MG capsule Take 1 capsule by mouth once daily 90 capsule 3   Polyethylene Glycol 3350 (MIRALAX PO) Take by mouth as needed.     rosuvastatin (CRESTOR) 10 MG tablet Take 1 tablet by mouth once daily 90 tablet 0   tetrahydrozoline 0.05 % ophthalmic solution Place 1 drop into both eyes as needed (eye allergies).     triamcinolone (NASACORT) 55 MCG/ACT AERO nasal inhaler Place 2 sprays into the nose daily. 2 sprays each nostril at night 1 each 12   meloxicam (MOBIC) 15 MG tablet Take 1 tablet (15 mg total) by mouth daily. (Patient not taking: Reported on 07/26/2021) 30 tablet 0   No current facility-administered medications on file prior to visit.   Allergies  Allergen Reactions   Aldactone [Spironolactone] Rash   Social History   Socioeconomic History   Marital status: Married    Spouse  name: Not on file   Number of children: Not on file   Years of education: Not on file   Highest education level: Not on file  Occupational History   Not on file  Tobacco Use   Smoking status: Never   Smokeless tobacco: Never  Vaping Use   Vaping Use: Never used  Substance and Sexual Activity   Alcohol use: No   Drug use: No   Sexual activity: Yes  Other Topics Concern   Not on file  Social History Narrative   Not on file   Social Determinants of Health   Financial Resource Strain: Low Risk  (06/13/2021)   Overall Financial Resource Strain (CARDIA)    Difficulty of Paying Living Expenses: Not hard at all  Food Insecurity: No Food Insecurity (06/19/2021)   Hunger Vital Sign    Worried About Running Out of Food in the Last Year: Never true    [place] in the Last Year:  Never true  Transportation Needs: No Transportation Needs (06/13/2021)   PRAPARE - Hydrologist (Medical): No    Lack of Transportation (Non-Medical): No  Physical Activity: Insufficiently Active (06/13/2021)   Exercise Vital Sign    Days of Exercise per Week: 3 days    Minutes of Exercise per Session: 30 min  Stress: Stress Concern Present (06/13/2021)   [place]    Feeling of Stress : To some extent  Social Connections: Socially Integrated (06/13/2021)   Social Connection and Isolation Panel [NHANES]    Frequency of Communication with Friends and Family: More than three times a week    Frequency of Social Gatherings with Friends and Family: Twice a week    Attends Religious Services: More than 4 times per year    Active Member of [employer] or Organizations: Yes    Attends Archivist Meetings: More than 4 times per year    Marital Status: Married  Human resources officer Violence: Not At Risk (06/13/2021)   Humiliation, Afraid, Rape, and Kick questionnaire    Fear of Current or Ex-Partner: No    Emotionally Abused: No    Physically Abused: No    Sexually Abused: No      Review of Systems  All other systems reviewed and are negative.      Objective:   Physical Exam Vitals reviewed.  Constitutional:      Appearance: Normal appearance.  Cardiovascular:     Rate and Rhythm: Normal rate and regular rhythm.     Pulses: Normal pulses.     Heart sounds: Murmur heard.  Pulmonary:     Effort: Pulmonary effort is normal. No respiratory distress.     Breath sounds: Normal breath sounds. No stridor. No wheezing, rhonchi or rales.  Chest:     Chest wall: No tenderness.  Musculoskeletal:     Right lower leg: Edema present.     Left lower leg: Edema present.  Neurological:     Mental Status: She is alert.         Assessment & Plan:  Chronic heart failure with preserved ejection  fraction ([place]) - Plan: BASIC METABOLIC PANEL WITH GFR Patient appears fluid overloaded.  Begin Lasix 40 mg twice daily.  Add K door 20 mill equivalents once daily, continue Jardiance, recheck swelling in 1 week or sooner if worsening.

## 2021-07-27 LAB — BASIC METABOLIC PANEL WITH GFR
BUN/Creatinine Ratio: 9 (calc) (ref 6–22)
BUN: 9 mg/dL (ref 7–25)
CO2: 31 mmol/L (ref 20–32)
Calcium: 9.4 mg/dL (ref 8.6–10.4)
Chloride: 102 mmol/L (ref 98–110)
Creat: 1.03 mg/dL — ABNORMAL HIGH (ref 0.60–1.00)
Glucose, Bld: 118 mg/dL — ABNORMAL HIGH (ref 65–99)
Potassium: 3.3 mmol/L — ABNORMAL LOW (ref 3.5–5.3)
Sodium: 143 mmol/L (ref 135–146)
eGFR: 57 mL/min/{1.73_m2} — ABNORMAL LOW (ref >=60)

## 2021-08-01 DIAGNOSIS — M5416 Radiculopathy, lumbar region: Secondary | ICD-10-CM | POA: Diagnosis not present

## 2021-08-05 ENCOUNTER — Ambulatory Visit: Payer: Medicare HMO | Admitting: Family Medicine

## 2021-08-06 ENCOUNTER — Ambulatory Visit (INDEPENDENT_AMBULATORY_CARE_PROVIDER_SITE_OTHER): Payer: Medicare HMO | Admitting: *Deleted

## 2021-08-06 DIAGNOSIS — I5032 Chronic diastolic (congestive) heart failure: Secondary | ICD-10-CM

## 2021-08-06 DIAGNOSIS — F320 Major depressive disorder, single episode, mild: Secondary | ICD-10-CM

## 2021-08-06 NOTE — Patient Instructions (Signed)
Visit Information  Thank you for taking time to visit with me today. Please don't hesitate to contact me if I can be of assistance to you before our next scheduled telephone appointment.  Following are the goals we discussed today:  -attend your initial counseling visit (virtual) as arranged - schedule follow up counseling appointments  -review the resource materials received- consider getting involved with these!     Please call the care guide team at [phone] if you need to cancel or reschedule your appointment.   If you are experiencing a Mental Health or [place] or need someone to talk to, please call the [place] National Suicide Prevention Lifeline: 270-445-5185 or TTY: 323-242-4028 TTY (505)450-4872) to talk to a trained counselor call 911   Patient verbalizes understanding of instructions and care plan provided today and agrees to view in [place]. Active MyChart status and patient understanding of how to access instructions and care plan via MyChart confirmed with patient.    Eduard Clos MSW, LCSW Licensed Clinical Social Worker [place]    [phone]

## 2021-08-06 NOTE — Chronic Care Management (AMB) (Signed)
Chronic Care Management    Clinical Social Work Note  08/06/2021 Name: [name] MRN: [id number] DOB: 1945/07/01  [name] is a 76 y.o. year old female who is a primary care patient of Pickard, [name], MD. The CCM team was consulted to assist the patient with chronic disease management and/or care coordination needs related to: [employer] , Mental Health Counseling and Resources, Grief Counseling, and activity/isolation .   Engaged with patient by telephone for follow up visit in response to provider referral for social work chronic care management and care coordination services.   Consent to Services:  The patient was given information about Chronic Care Management services, agreed to services, and gave verbal consent prior to initiation of services.  Please see initial visit note for detailed documentation.   Patient agreed to services and consent obtained.   Assessment: Review of patient past medical history, allergies, medications, and health status, including review of relevant consultants reports was performed today as part of a comprehensive evaluation and provision of chronic care management and care coordination services.     SDOH (Social Determinants of Health) assessments and interventions performed:    Advanced Directives Status:  pt received packet and is encouraged to review and complete with Notary as she wishes  CCM Care Plan  Allergies  Allergen Reactions   Aldactone [Spironolactone] Rash    Outpatient Encounter Medications as of 08/06/2021  Medication Sig   albuterol (VENTOLIN HFA) 108 (90 Base) MCG/ACT inhaler INHALE 2 PUFFS BY MOUTH EVERY 4 HOURS AS NEEDED FOR WHEEZING FOR SHORTNESS OF BREATH   Ascorbic Acid (VITAMIN C GUMMIES PO) Take by mouth. Natures Made Vit C gummies   diltiazem (CARDIZEM CD) 360 MG 24 hr capsule Take 1 capsule by mouth once daily   doxazosin (CARDURA) 2 MG tablet Take 1 tablet by mouth once daily   empagliflozin  (JARDIANCE) 10 MG TABS tablet Take 1 tablet (10 mg total) by mouth daily before breakfast.   fluticasone (FLONASE) 50 MCG/ACT nasal spray Place 2 sprays into both nostrils daily.   furosemide (LASIX) 40 MG tablet Take 1 tablet by mouth twice daily   lisinopril (ZESTRIL) 40 MG tablet Take 1 tablet by mouth once daily   meloxicam (MOBIC) 15 MG tablet Take 1 tablet (15 mg total) by mouth daily. (Patient not taking: Reported on 07/26/2021)   naproxen (NAPROSYN) 500 MG tablet TAKE 1 TABLET BY MOUTH TWICE DAILY AS NEEDED FOR MILD PAIN   omeprazole (PRILOSEC) 40 MG capsule Take 1 capsule by mouth once daily   Polyethylene Glycol 3350 (MIRALAX PO) Take by mouth as needed.   potassium chloride SA (KLOR-CON M) 20 MEQ tablet Take 1 tablet (20 mEq total) by mouth daily.   rosuvastatin (CRESTOR) 10 MG tablet Take 1 tablet by mouth once daily   tetrahydrozoline 0.05 % ophthalmic solution Place 1 drop into both eyes as needed (eye allergies).   triamcinolone (NASACORT) 55 MCG/ACT AERO nasal inhaler Place 2 sprays into the nose daily. 2 sprays each nostril at night   No facility-administered encounter medications on file as of 08/06/2021.    Patient Active Problem List   Diagnosis Date Noted   Pincer nail deformity 04/16/2021   Diverticulitis 12/27/2020   History of hysterectomy 12/27/2020   Morbid obesity ([place]) 03/23/2020   Heart failure with preserved ejection fraction ([place]) 12/27/2019   Bilateral knee pain 02/15/2019   Spinal stenosis of lumbar region at multiple levels    Arthropathia 09/23/2013   Benign  neoplasm of colon 09/23/2013   Essential (primary) hypertension 09/23/2013   SBO (small bowel obstruction) ([place]) 08/17/2013   Pelvic mass 07/05/2013   Pain in joint, pelvic region and thigh 07/05/2013   Hyperlipidemia    Arthritis    Colon polyps     Conditions to be addressed/monitored: Depression; Limited social support, Mental Health Concerns , Social Isolation, and grief/loss  Care Plan :  LCSW Plan of Care  Updates made by [name] Peer, LCSW since 08/06/2021 12:00 AM     Problem: Depression      Long-Range Goal: Improve quality of life and reduce depression symptoms   Start Date: 06/18/2021  Expected End Date: 09/25/2021  This Visit's Progress: On track  Recent Progress: On track  Priority: High  Note:   Current Barriers:  Transportation, Mental Health Concerns , and Lacks knowledge of community resource:    No Advanced Directives in place Lacks knowledge of how to connect   [place]):  Patient  will patient will work with SW to address concerns related to depression, grief/loss and re-connecting/socialization  through collaboration with [place] representative, provider, and care team.   Interventions: 08/06/21- Pt shared that yesterday was the 5th anniversary of her son passing away. "I did better this year.....did not sit around crying and think it's getting better". She recently had a 2 week visit from her son's wife/widow and her grandson- "he was 42years old when his dad passed". She enjoyed and appreciated their visit.  Pt is also looking into options for getting connected with activity/exercise and may join the Y program. "I enjoy the pool". She is also set up for her initial counseling session on 09/04/21.   Pt also reviewing additional resources sent to her by CSW and Care guide.   07/16/21-CSW spoke with pt for follow up today- she has received the paperwork from [facility] for outpatient counseling and plans to complete and take to them along with her insurance card. Pt also indicates she has received the resources mailed to her and plans to review this and consider-    CSW made initial outreach call to pt today to assess reported depression. Pt admits to dealing with lack of interest, poor PO intake and even some "hopelessness". Pt denies SI/HI. She reports no history of medication or hospitalization for depression. Pt shared she lost a son about 5 years ago;  "Quita Skye passed away in 2016-09-03".  CSW allowed pt to share and express her grief and loss. Pt shared she [facility] a few years ago and it was helpful. She is interested in seeking counseling and CSW will seek options in-network for her.  Pt is also interested in pursuing some community based activities; discussed the Radio producer, volunteer work and Visual merchandiser as opportunities to consider. Pt seems interested and also may get re-connected with [employer].  Pt does not have a HCPOA- will have packet mailed to her to review and complete as she desires.    1:1 collaboration with primary care provider regarding development and update of comprehensive plan of care as evidenced by provider attestation and co-signature Inter-disciplinary care team collaboration (see longitudinal plan of care) Evaluation of current treatment plan related to  self management and patient's adherence to plan as established by provider Review resources, discussed options and provided patient information about  Community food options  Referral to care guide (food pantry sites,  Advertising account planner, Psychologist, clinical programs, Development worker, community of [place],)  Mental Health:  (Status: New goal.) Evaluation of current treatment plan related to Depression: depressed mood loss of energy/fatigue decreased appetite and Grief Motivational Interviewing employed Depression screen reviewed  PHQ2/ PHQ9 completed Solution-Focused Strategies employed:  Active listening / Reflection utilized  Problem [place] strategies reviewed  and  Social Determinants of Health in Patient with Depression: depressed mood hopelessness and Grief:  (Status: New goal.) SDOH assessments completed: Landscape architect , Social Connections, Stress, and Physical Activity Evaluation of current treatment plan related to unmet needs Community food options (sites), Referral to care guide (food pantry  sites,  Advertising account planner, Senior activity programs, Tax adviser,) ) , Solution-Focused Strategies employed: , and Engineer, petroleum Provided  Task & activities to accomplish goals: Continue with compliance of taking medication prescribed by Doctor Continue with your self-care action plan (seeking community activities; senior center, [employer], church, etc) Referral placed for counseling and they will reach out to you           Follow Up Plan: Appointment scheduled for SW follow up with client by phone on: 09/10/21      Eduard Clos MSW, [place] Licensed Clinical Social Worker [place]    [phone]

## 2021-08-15 DIAGNOSIS — H40033 Anatomical narrow angle, bilateral: Secondary | ICD-10-CM | POA: Diagnosis not present

## 2021-08-15 DIAGNOSIS — H40013 Open angle with borderline findings, low risk, bilateral: Secondary | ICD-10-CM | POA: Diagnosis not present

## 2021-08-26 DIAGNOSIS — R69 Illness, unspecified: Secondary | ICD-10-CM | POA: Diagnosis not present

## 2021-08-26 DIAGNOSIS — I5032 Chronic diastolic (congestive) heart failure: Secondary | ICD-10-CM

## 2021-08-26 DIAGNOSIS — F320 Major depressive disorder, single episode, mild: Secondary | ICD-10-CM | POA: Diagnosis not present

## 2021-08-30 ENCOUNTER — Ambulatory Visit (INDEPENDENT_AMBULATORY_CARE_PROVIDER_SITE_OTHER): Payer: Medicare HMO | Admitting: Psychologist

## 2021-08-30 DIAGNOSIS — R69 Illness, unspecified: Secondary | ICD-10-CM | POA: Diagnosis not present

## 2021-08-30 DIAGNOSIS — Z634 Disappearance and death of family member: Secondary | ICD-10-CM | POA: Diagnosis not present

## 2021-08-30 DIAGNOSIS — F330 Major depressive disorder, recurrent, mild: Secondary | ICD-10-CM

## 2021-08-30 NOTE — Progress Notes (Signed)
                Rishav Rockefeller, PsyD 
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
## 2021-08-30 NOTE — Plan of Care (Signed)
Goals °Alleviate depressive symptoms °Recognize, accept, and cope with depressive feelings °Develop healthy thinking patterns °Develop healthy interpersonal relationships ° °Objectives °Cooperate with a medication evaluation by a physician °Verbalize an accurate understanding of depression °Verbalize an understanding of the treatment °Identify and replace thoughts that support depression °Learn and implement behavioral strategies °Verbalize an understanding and resolution of current interpersonal problems °Learn and implement problem solving and decision making skills °Learn and implement conflict resolution skills to resolve interpersonal problems °Verbalize an understanding of healthy and unhealthy emotions verbalize insight into how past relationships may be influence current experiences with depression °Use mindfulness and acceptance strategies and increase value based behavior  °Increase hopeful statements about the future.  °Interventions °Consistent with treatment model, discuss how change in cognitive, behavioral, and interpersonal can help client alleviate depression °CBT °Behavioral activation help the client explore the relationship, nature of the dispute,  °Help the client develop new interpersonal skills and relationships °Conduct Problem so living therapy °Teach conflict resolution skills °Use a process-experiential approach °Conduct TLDP °Conduct ACT °Evaluate need for psychotropic medication °Monitor adherence to medication  ° °Goals °Begin a healthy grieving process °Objectives °Tell in detail the story of the current loss that is triggering symptoms °Read books on the topic of grief °Watch videos on the theme of grief °Begin verbalizing feelings associated with the loss °Attend a grief support group °express thoughts and feelings about the deceased °Identify and voice positives about the deceased °implement acts of spiritual faith  °Interventions °create a safe environment and actively build  trust °use empathy, compassion, and support °ask the patient to write a letter to the lost person °conduct empty chair °ask the patient to discuss and list the positives and negative aspects of the person °encourage patient to rely upon his/her spiritual faith  °ask client to read books on grief °ask patient to watch videos about grief °assist patient in identifying emotions  °ask patient to attend support group  ° °

## 2021-09-02 ENCOUNTER — Other Ambulatory Visit: Payer: Self-pay | Admitting: Family Medicine

## 2021-09-03 ENCOUNTER — Ambulatory Visit (INDEPENDENT_AMBULATORY_CARE_PROVIDER_SITE_OTHER): Payer: Medicare HMO | Admitting: *Deleted

## 2021-09-03 DIAGNOSIS — I5032 Chronic diastolic (congestive) heart failure: Secondary | ICD-10-CM

## 2021-09-03 DIAGNOSIS — M1990 Unspecified osteoarthritis, unspecified site: Secondary | ICD-10-CM

## 2021-09-03 DIAGNOSIS — K5792 Diverticulitis of intestine, part unspecified, without perforation or abscess without bleeding: Secondary | ICD-10-CM

## 2021-09-03 DIAGNOSIS — F320 Major depressive disorder, single episode, mild: Secondary | ICD-10-CM

## 2021-09-03 NOTE — Chronic Care Management (AMB) (Signed)
Chronic Care Management    Clinical Social Work Note  09/03/2021 Name: ROCHANDA HARPHAM MRN: [id number] DOB: [birth year]  RUMI KOLODZIEJ is a 76 y.o. year old female who is a primary care patient of Pickard, [name], MD. The CCM team was consulted to assist the patient with chronic disease management and/or care coordination needs related to: Mental Health Counseling and Resources.   Engaged with patient by telephone for follow up visit in response to provider referral for social work chronic care management and care coordination services.   Consent to Services:  The patient was given information about Chronic Care Management services, agreed to services, and gave verbal consent prior to initiation of services.  Please see initial visit note for detailed documentation.   Patient agreed to services and consent obtained.   Assessment: Review of patient past medical history, allergies, medications, and health status, including review of relevant consultants reports was performed today as part of a comprehensive evaluation and provision of chronic care management and care coordination services.     SDOH (Social Determinants of Health) assessments and interventions performed:    Advanced Directives Status: Not addressed in this encounter.  CCM Care Plan  Allergies  Allergen Reactions   Aldactone [Spironolactone] Rash    Outpatient Encounter Medications as of 09/03/2021  Medication Sig   albuterol (VENTOLIN HFA) 108 (90 Base) MCG/ACT inhaler INHALE 2 PUFFS BY MOUTH EVERY 4 HOURS AS NEEDED FOR WHEEZING FOR SHORTNESS OF BREATH   Ascorbic Acid (VITAMIN C GUMMIES PO) Take by mouth. Natures Made Vit C gummies   diltiazem (CARDIZEM CD) 360 MG 24 hr capsule Take 1 capsule by mouth once daily   doxazosin (CARDURA) 2 MG tablet Take 1 tablet by mouth once daily   empagliflozin (JARDIANCE) 10 MG TABS tablet Take 1 tablet (10 mg total) by mouth daily before breakfast.   fluticasone (FLONASE) 50 MCG/ACT  nasal spray Place 2 sprays into both nostrils daily.   furosemide (LASIX) 40 MG tablet Take 1 tablet by mouth twice daily   lisinopril (ZESTRIL) 40 MG tablet Take 1 tablet by mouth once daily   meloxicam (MOBIC) 15 MG tablet Take 1 tablet (15 mg total) by mouth daily. (Patient not taking: Reported on 07/26/2021)   naproxen (NAPROSYN) 500 MG tablet TAKE 1 TABLET BY MOUTH TWICE DAILY AS NEEDED FOR MILD PAIN   omeprazole (PRILOSEC) 40 MG capsule Take 1 capsule by mouth once daily   Polyethylene Glycol 3350 (MIRALAX PO) Take by mouth as needed.   potassium chloride SA (KLOR-CON M) 20 MEQ tablet Take 1 tablet (20 mEq total) by mouth daily.   rosuvastatin (CRESTOR) 10 MG tablet Take 1 tablet by mouth once daily   tetrahydrozoline 0.05 % ophthalmic solution Place 1 drop into both eyes as needed (eye allergies).   triamcinolone (NASACORT) 55 MCG/ACT AERO nasal inhaler Place 2 sprays into the nose daily. 2 sprays each nostril at night   No facility-administered encounter medications on file as of 09/03/2021.    Patient Active Problem List   Diagnosis Date Noted   Pincer nail deformity 04/16/2021   Diverticulitis 12/27/2020   History of hysterectomy 12/27/2020   Morbid obesity ([place]) 03/23/2020   Heart failure with preserved ejection fraction ([place]) 12/27/2019   Bilateral knee pain 02/15/2019   Spinal stenosis of lumbar region at multiple levels    Arthropathia 09/23/2013   Benign neoplasm of colon 09/23/2013   Essential (primary) hypertension 09/23/2013   SBO (small bowel obstruction) ([place]) 08/17/2013  Pelvic mass 07/05/2013   Pain in joint, pelvic region and thigh 07/05/2013   Hyperlipidemia    Arthritis    Colon polyps     Conditions to be addressed/monitored: Depression; Mental Health Concerns   Care Plan : LCSW Plan of Care  Updates made by Deirdre Peer, LCSW since 09/03/2021 12:00 AM     Problem: Depression      Long-Range Goal: Improve quality of life and reduce depression  symptoms   Start Date: 06/18/2021  Expected End Date: 09/25/2021  This Visit's Progress: On track  Recent Progress: On track  Priority: High  Note:   Current Barriers:  Transportation, Mental Health Concerns , and Lacks knowledge of community resource:    No Advanced Directives in place Lacks knowledge of how to connect   [place]):  Patient  will patient will work with SW to address concerns related to depression, grief/loss and re-connecting/socialization  through collaboration with [place] representative, provider, and care team.   Interventions: 09/03/21- Pt reports she had her initial visit with [facility] counselor- virtually and plans her next visit in person on 09/10/21. Pt inquiring about [facility] and is considering joining- suggested she call to schedule a tour on 09/10/21 while she is there for her counseling visit.   08/06/21- Pt shared that yesterday was the 5th anniversary of her son passing away. "I did better this year.....did not sit around crying and think it's getting better". She recently had a 2 week visit from her son's wife/widow and her grandson- "he was 71years old when his dad passed". She enjoyed and appreciated their visit.  Pt is also looking into options for getting connected with activity/exercise and may join the Y program. "I enjoy the pool". She is also set up for her initial counseling session on 09/04/21.   Pt also reviewing additional resources sent to her by CSW and Care guide.   07/16/21-CSW spoke with pt for follow up today- she has received the paperwork from [facility] for outpatient counseling and plans to complete and take to them along with her insurance card. Pt also indicates she has received the resources mailed to her and plans to review this and consider-    CSW made initial outreach call to pt today to assess reported depression. Pt admits to dealing with lack of interest, poor PO intake and even some "hopelessness". Pt denies SI/HI. She reports no history  of medication or hospitalization for depression. Pt shared she lost a son about 5 years ago; "Quita Skye passed away in 08-16-2016".  CSW allowed pt to share and express her grief and loss. Pt shared she [facility] a few years ago and it was helpful. She is interested in seeking counseling and CSW will seek options in-network for her.  Pt is also interested in pursuing some community based activities; discussed the Radio producer, volunteer work and Visual merchandiser as opportunities to consider. Pt seems interested and also may get re-connected with [employer].  Pt does not have a HCPOA- will have packet mailed to her to review and complete as she desires.    1:1 collaboration with primary care provider regarding development and update of comprehensive plan of care as evidenced by provider attestation and co-signature Inter-disciplinary care team collaboration (see longitudinal plan of care) Evaluation of current treatment plan related to  self management and patient's adherence to plan as established by provider Review resources, discussed options and provided patient information about  Community food options  Referral to care guide (food pantry sites,  Advertising account planner, Psychologist, clinical programs, Tax adviser,)    Mental Health:  (Status: New goal.) Evaluation of current treatment plan related to Depression: depressed mood loss of energy/fatigue decreased appetite and Grief Motivational Interviewing employed Depression screen reviewed  PHQ2/ PHQ9 completed Solution-Focused Strategies employed:  Active listening / Reflection utilized  Problem [place] strategies reviewed  and  Social Determinants of Health in Patient with Depression: depressed mood hopelessness and Grief:  (Status: New goal.) SDOH assessments completed: Landscape architect , Social Connections, Stress, and Physical Activity Evaluation of current treatment plan  related to unmet needs Community food options (sites), Referral to care guide (food pantry sites,  Advertising account planner, Senior activity programs, Tax adviser,) ) , Solution-Focused Strategies employed: , and Engineer, petroleum Provided  Task & activities to accomplish goals: Continue with compliance of taking medication prescribed by Doctor Continue with your self-care action plan (seeking community activities; senior center, [employer], church, etc) Referral placed for counseling and they will reach out to you           Follow Up Plan: Appointment scheduled for SW follow up with client by phone on: 09/17/21      Eduard Clos MSW, LCSW Licensed Clinical Social Worker      [phone]

## 2021-09-03 NOTE — Patient Instructions (Signed)
Visit Information  Thank you for taking time to visit with me today. Please don't hesitate to contact me if I can be of assistance to you before our next scheduled telephone appointment.  Following are the goals we discussed today:     Our next appointment is by telephone on 09/17/21    Please call the care guide team at [phone] if you need to cancel or reschedule your appointment.   If you are experiencing a Mental Health or [place] or need someone to talk to, please call 911   The patient verbalized understanding of instructions, educational materials, and care plan provided today and DECLINED offer to receive copy of patient instructions, educational materials, and care plan.   Eduard Clos MSW, LCSW Licensed Clinical Social Worker [place]    [phone]

## 2021-09-05 DIAGNOSIS — H6591 Unspecified nonsuppurative otitis media, right ear: Secondary | ICD-10-CM | POA: Diagnosis not present

## 2021-09-05 DIAGNOSIS — H9193 Unspecified hearing loss, bilateral: Secondary | ICD-10-CM | POA: Diagnosis not present

## 2021-09-05 DIAGNOSIS — J309 Allergic rhinitis, unspecified: Secondary | ICD-10-CM | POA: Diagnosis not present

## 2021-09-05 DIAGNOSIS — H6981 Other specified disorders of Eustachian tube, right ear: Secondary | ICD-10-CM | POA: Diagnosis not present

## 2021-09-10 ENCOUNTER — Ambulatory Visit: Payer: Medicare HMO | Admitting: *Deleted

## 2021-09-10 ENCOUNTER — Ambulatory Visit (INDEPENDENT_AMBULATORY_CARE_PROVIDER_SITE_OTHER): Payer: Medicare HMO | Admitting: Psychologist

## 2021-09-10 DIAGNOSIS — F320 Major depressive disorder, single episode, mild: Secondary | ICD-10-CM

## 2021-09-10 DIAGNOSIS — F330 Major depressive disorder, recurrent, mild: Secondary | ICD-10-CM | POA: Diagnosis not present

## 2021-09-10 DIAGNOSIS — Z634 Disappearance and death of family member: Secondary | ICD-10-CM | POA: Diagnosis not present

## 2021-09-10 DIAGNOSIS — K5792 Diverticulitis of intestine, part unspecified, without perforation or abscess without bleeding: Secondary | ICD-10-CM

## 2021-09-10 DIAGNOSIS — R69 Illness, unspecified: Secondary | ICD-10-CM | POA: Diagnosis not present

## 2021-09-10 DIAGNOSIS — I5032 Chronic diastolic (congestive) heart failure: Secondary | ICD-10-CM

## 2021-09-10 NOTE — Progress Notes (Signed)
                Zamyia Gowell, PsyD 

## 2021-09-10 NOTE — Patient Instructions (Signed)
Visit Information  Thank you for taking time to visit with me today. Please don't hesitate to contact me if I can be of assistance to you before our next scheduled telephone appointment.  Following are the goals we discussed today:    -continue with counseling visits - tour [facility] program and seek information on classes, etc -consider local Y's as well for exercise/activities -review the resource materials received- consider getting involved with these!   Our next appointment is by telephone on 09/17/21    Please call the care guide team at [phone] if you need to cancel or reschedule your appointment.   If you are experiencing a Mental Health or [place] or need someone to talk to, please call the [place] National Suicide Prevention Lifeline: (804)080-4034 or TTY: (628) 406-3658 TTY 567-172-8157) to talk to a trained counselor call 911   Patient verbalizes understanding of instructions and care plan provided today and agrees to view in [place]. Active MyChart status and patient understanding of how to access instructions and care plan via MyChart confirmed with patient.     Eduard Clos MSW, LCSW Licensed Clinical Social Worker      [phone]

## 2021-09-10 NOTE — Progress Notes (Signed)
[place] Counselor/Therapist Progress Note  Patient ID: [name], MRN: [id number],    Date: 09/10/2021  Time Spent: 08:05 am to 08:43 am; total time: 38 minutes   This session was held via in person. The patient consented to in-person therapy and was in the clinician's office. Limits of confidentiality were discussed with the patient.   Treatment Type: Individual Therapy  Reported Symptoms: Grief  Mental Status Exam: Appearance:  Well Groomed     Behavior: Appropriate  Motor: Normal  Speech/Language:  Clear and Coherent  Affect: Appropriate  Mood: normal  Thought process: normal  Thought content:   WNL  Sensory/Perceptual disturbances:   WNL  Orientation: oriented to person, place, and time/date  Attention: Good  Concentration: Good  Memory: WNL  Fund of knowledge:  Good  Insight:   Good  Judgment:  Good  Impulse Control: Good   Risk Assessment: Danger to Self:  No Self-injurious Behavior: No Danger to Others: No Duty to Warn:no Physical Aggression / Violence:No  Access to Firearms a concern: No  Gang Involvement:No   Subjective: Beginning the session, patient described herself as doing better. Continuing to talk, she indicated that having a safe place to express her grief has helped. After reviewing the treatment plan, patient participated in a life review of her son [name]). She processed thoughts and emotions. She also reflected on ways to honor her son. She was agreeable to homework and following up. She denied suicidal and homicidal ideation.    Interventions:  Worked on developing a therapeutic relationship with the patient using active listening and reflective statements. Provided emotional support using empathy and validation. Reviewed the treatment plan. Reviewed events since the intake. Praised the patient for doing better and explored what has assisted the patient. Normalized and validated thoughts. Identified goals for the session.  Completed a life review with the patient of [name]'s life. Processed ways that patient can honor her son. Used socratic questions to assist the patient gain insight into self. Challenged some of the thoughts expressed. Provided psychoeducation about grief. Assisted in problem solving. Provided empathic statements. Assigned homework. Assessed for suicidal and homicidal ideation.   Homework: Visit [name] and discuss with them about becoming a member and water aerobic class opportunities  Next Session: Emotional support  Diagnosis: F33.0 major depressive affective disorder, recurrent, mild and I77.8 uncomplicated bereavement.   Plan:   Goals Begin a healthy grieving process Objectives target date for all objectives is 08/31/2022 Tell in detail the story of the current loss that is triggering symptoms Read books on the topic of grief Watch videos on the theme of grief Begin verbalizing feelings associated with the loss Attend a grief support group express thoughts and feelings about the deceased Identify and voice positives about the deceased implement acts of spiritual faith  Interventions create a safe environment and actively build trust use empathy, compassion, and support ask the patient to write a letter to the lost person conduct empty chair ask the patient to discuss and list the positives and negative aspects of the person encourage patient to rely upon his/her spiritual faith  ask client to read books on grief ask patient to watch videos about grief assist patient in identifying emotions  ask patient to attend support group   The patient and clinician reviewed the treatment plan on 09/10/2021. The patient approved of the treatment plan.   [name] Chancy, PsyD

## 2021-09-10 NOTE — Chronic Care Management (AMB) (Signed)
Chronic Care Management    Clinical Social Work Note  09/10/2021 Name: ENRIQUE WEISS MRN: [id number] DOB: 1945/11/22  CAYTLYN EVERS is a 76 y.o. year old female who is a primary care patient of Pickard, [name], MD. The CCM team was consulted to assist the patient with chronic disease management and/or care coordination needs related to: [employer]  and [place] and Resources.   Engaged with patient by telephone for follow up visit in response to provider referral for social work chronic care management and care coordination services.   Consent to Services:  The patient was given information about Chronic Care Management services, agreed to services, and gave verbal consent prior to initiation of services.  Please see initial visit note for detailed documentation.   Patient agreed to services and consent obtained.   Assessment: Review of patient past medical history, allergies, medications, and health status, including review of relevant consultants reports was performed today as part of a comprehensive evaluation and provision of chronic care management and care coordination services.     SDOH (Social Determinants of Health) assessments and interventions performed:    Advanced Directives Status: Not addressed in this encounter.  CCM Care Plan  Allergies  Allergen Reactions   Aldactone [Spironolactone] Rash    Outpatient Encounter Medications as of 09/10/2021  Medication Sig   albuterol (VENTOLIN HFA) 108 (90 Base) MCG/ACT inhaler INHALE 2 PUFFS BY MOUTH EVERY 4 HOURS AS NEEDED FOR WHEEZING FOR SHORTNESS OF BREATH   Ascorbic Acid (VITAMIN C GUMMIES PO) Take by mouth. Natures Made Vit C gummies   diltiazem (CARDIZEM CD) 360 MG 24 hr capsule Take 1 capsule by mouth once daily   doxazosin (CARDURA) 2 MG tablet Take 1 tablet by mouth once daily   empagliflozin (JARDIANCE) 10 MG TABS tablet Take 1 tablet (10 mg total) by mouth daily before breakfast.    fluticasone (FLONASE) 50 MCG/ACT nasal spray Place 2 sprays into both nostrils daily.   furosemide (LASIX) 40 MG tablet Take 1 tablet by mouth twice daily   lisinopril (ZESTRIL) 40 MG tablet Take 1 tablet by mouth once daily   meloxicam (MOBIC) 15 MG tablet Take 1 tablet (15 mg total) by mouth daily. (Patient not taking: Reported on 07/26/2021)   naproxen (NAPROSYN) 500 MG tablet TAKE 1 TABLET BY MOUTH TWICE DAILY AS NEEDED FOR MILD PAIN   omeprazole (PRILOSEC) 40 MG capsule Take 1 capsule by mouth once daily   Polyethylene Glycol 3350 (MIRALAX PO) Take by mouth as needed.   potassium chloride SA (KLOR-CON M) 20 MEQ tablet Take 1 tablet (20 mEq total) by mouth daily.   rosuvastatin (CRESTOR) 10 MG tablet Take 1 tablet by mouth once daily   tetrahydrozoline 0.05 % ophthalmic solution Place 1 drop into both eyes as needed (eye allergies).   triamcinolone (NASACORT) 55 MCG/ACT AERO nasal inhaler Place 2 sprays into the nose daily. 2 sprays each nostril at night   No facility-administered encounter medications on file as of 09/10/2021.    Patient Active Problem List   Diagnosis Date Noted   Pincer nail deformity 04/16/2021   Diverticulitis 12/27/2020   History of hysterectomy 12/27/2020   Morbid obesity ([place]) 03/23/2020   Heart failure with preserved ejection fraction ([place]) 12/27/2019   Bilateral knee pain 02/15/2019   Spinal stenosis of lumbar region at multiple levels    Arthropathia 09/23/2013   Benign neoplasm of colon 09/23/2013   Essential (primary) hypertension 09/23/2013   SBO (small bowel  obstruction) ([place]) 08/17/2013   Pelvic mass 07/05/2013   Pain in joint, pelvic region and thigh 07/05/2013   Hyperlipidemia    Arthritis    Colon polyps     Conditions to be addressed/monitored: Depression; Mental Health Concerns   Care Plan : LCSW Plan of Care  Updates made by Deirdre Peer, LCSW since 09/10/2021 12:00 AM     Problem: Depression      Long-Range Goal: Improve  quality of life and reduce depression symptoms   Start Date: 06/18/2021  Expected End Date: 09/25/2021  This Visit's Progress: On track  Recent Progress: On track  Priority: High  Note:   Current Barriers:  Transportation, Mental Health Concerns , and Lacks knowledge of community resource:    No Advanced Directives in place Lacks knowledge of how to connect   [place]):  Patient  will patient will work with SW to address concerns related to depression, grief/loss and re-connecting/socialization  through collaboration with [place] representative, provider, and care team.   Interventions: 09/10/21- Pt reports she had her first in-person counseling session today and it went well. She is planning to [facility] tomorrow and is considering joining-  plan follow up with pt 09/17/21 to complete PHQ9 and assess for further needs. 08/06/21- Pt shared that yesterday was the 5th anniversary of her son passing away. "I did better this year.....did not sit around crying and think it's getting better". She recently had a 2 week visit from her son's wife/widow and her grandson- "he was 57years old when his dad passed". She enjoyed and appreciated their visit.  Pt is also looking into options for getting connected with activity/exercise and may join the Y program. "I enjoy the pool". She is also set up for her initial counseling session on 09/04/21.   Pt also reviewing additional resources sent to her by CSW and Care guide.   07/16/21-CSW spoke with pt for follow up today- she has received the paperwork from [facility] for outpatient counseling and plans to complete and take to them along with her insurance card. Pt also indicates she has received the resources mailed to her and plans to review this and consider-    CSW made initial outreach call to pt today to assess reported depression. Pt admits to dealing with lack of interest, poor PO intake and even some "hopelessness". Pt denies SI/HI. She reports no  history of medication or hospitalization for depression. Pt shared she lost a son about 5 years ago; "Quita Skye passed away in 08-21-2016".  CSW allowed pt to share and express her grief and loss. Pt shared [facility] and it was helpful. She is interested in seeking counseling and CSW will seek options in-network for her.  Pt is also interested in pursuing some community based activities; discussed the Radio producer, volunteer work and Visual merchandiser as opportunities to consider. Pt seems interested and also may get re-connected with [employer].  Pt does not have a HCPOA- will have packet mailed to her to review and complete as she desires.    1:1 collaboration with primary care provider regarding development and update of comprehensive plan of care as evidenced by provider attestation and co-signature Inter-disciplinary care team collaboration (see longitudinal plan of care) Evaluation of current treatment plan related to  self management and patient's adherence to plan as established by provider Review resources, discussed options and provided patient information about  [employer]  options  Referral to care guide (food pantry sites,  Advertising account planner, Senior activity programs, Tax adviser,)    Mental Health:  (Status: New goal.) Evaluation of current treatment plan related to Depression: depressed mood loss of energy/fatigue decreased appetite and Grief Motivational Interviewing employed Depression screen reviewed  PHQ2/ PHQ9 completed Solution-Focused Strategies employed:  Active listening / Reflection utilized  Problem [place] strategies reviewed  and  Social Determinants of Health in Patient with Depression: depressed mood hopelessness and Grief:  (Status: New goal.) SDOH assessments completed: Landscape architect , Social Connections, Stress, and Physical Activity Evaluation of current  treatment plan related to unmet needs Community food options (sites), Referral to care guide (food pantry sites,  Advertising account planner, Psychologist, clinical programs, Tax adviser,) ) , Solution-Focused Strategies employed: , and Engineer, petroleum Provided  Task & activities to accomplish goals: Continue with compliance of taking medication prescribed by Doctor Continue with your self-care action plan (seeking community activities; senior center, Environmental education officer, church, Social research officer, government) -continue with your counseling visits - tour [facility] program and seek information on classes, etc -consider local Y's as well for exercise/activities -review the resource materials received- consider getting involved with these!            Follow Up Plan: Appointment scheduled for SW follow up with client by phone on: 09/17/21      Eduard Clos MSW, LCSW Licensed Clinical Social Worker      [phone]

## 2021-09-17 ENCOUNTER — Telehealth: Payer: Medicare HMO

## 2021-09-24 ENCOUNTER — Other Ambulatory Visit: Payer: Self-pay | Admitting: Internal Medicine

## 2021-09-25 ENCOUNTER — Ambulatory Visit: Payer: Medicare HMO | Attending: Internal Medicine | Admitting: Internal Medicine

## 2021-09-25 ENCOUNTER — Telehealth: Payer: Self-pay | Admitting: *Deleted

## 2021-09-25 ENCOUNTER — Ambulatory Visit: Payer: Medicare HMO | Admitting: Nurse Practitioner

## 2021-09-25 ENCOUNTER — Encounter: Payer: Self-pay | Admitting: Internal Medicine

## 2021-09-25 VITALS — BP 120/58 | HR 69 | Ht 63.0 in | Wt 257.0 lb

## 2021-09-25 DIAGNOSIS — I5032 Chronic diastolic (congestive) heart failure: Secondary | ICD-10-CM | POA: Diagnosis not present

## 2021-09-25 DIAGNOSIS — I10 Essential (primary) hypertension: Secondary | ICD-10-CM

## 2021-09-25 DIAGNOSIS — E7800 Pure hypercholesterolemia, unspecified: Secondary | ICD-10-CM

## 2021-09-25 MED ORDER — POTASSIUM CHLORIDE CRYS ER 20 MEQ PO TBCR: 40.0000 meq | EXTENDED_RELEASE_TABLET | Freq: Every day | ORAL | 3 refills | Status: DC

## 2021-09-25 MED ORDER — FUROSEMIDE 40 MG PO TABS: 60.0000 mg | ORAL_TABLET | Freq: Two times a day (BID) | ORAL | 3 refills | Status: DC

## 2021-09-25 NOTE — Telephone Encounter (Signed)
  Care Management   Follow Up Note   09/25/2021 Name: [name] MRN: [id number] DOB: 1945-07-02   Referred by: [name], MD Reason for referral : No chief complaint on file.   An unsuccessful telephone outreach was attempted today. The patient was referred to the case management team for assistance with care management and care coordination.   Follow Up Plan: Telephone follow up appointment with care management team member to be re-scheduled  Eduard Clos MSW, LCSW Licensed Clinical Social Worker  [facility]    [phone]

## 2021-09-25 NOTE — Patient Instructions (Signed)
Medication Instructions:  INCREASE FUROSEMIDE TO 60 MG TWICE DAILY  INCREASE POTASSIUM TO 40 MEQ IN THE AND 20 MEQ IN THE PM  *If you need a refill on your cardiac medications before your next appointment, please call your pharmacy*   Lab Work: BMET AND BNP IN 1 WEEK  If you have labs (blood work) drawn today and your tests are completely normal, you will receive your results only by: [facility] (if you have MyChart) OR A paper copy in the mail If you have any lab test that is abnormal or we need to change your treatment, we will call you to review the results.   Testing/Procedures: Your physician has requested that you have an echocardiogram. Echocardiography is a painless test that uses sound waves to create images of your heart. It provides your doctor with information about the size and shape of your heart and how well your heart's chambers and valves are working. This procedure takes approximately one hour. There are no restrictions for this procedure.    Follow-Up: At [facility], you and your health needs are our priority.  As part of our continuing mission to provide you with exceptional heart care, we have created designated Provider Care Teams.  These Care Teams include your primary Cardiologist (physician) and Advanced Practice Providers (APPs -  Physician Assistants and Nurse Practitioners) who all work together to provide you with the care you need, when you need it.  We recommend signing up for the patient portal called "MyChart".  Sign up information is provided on this After Visit Summary.  MyChart is used to connect with patients for Virtual Visits (Telemedicine).  Patients are able to view lab/test results, encounter notes, upcoming appointments, etc.  Non-urgent messages can be sent to your provider as well.   To learn more about what you can do with MyChart, go to [email or web address].    Your next appointment:   3 month(s)  The format for your next  appointment:   In Person  Provider:   Werner Lean, MD  or [address], PA-C, Nicholes Rough, PA-C, Melina Copa, PA-C, Ambrose Pancoast, NP, Ermalinda Barrios, PA-C, Christen Bame, NP, or Richardson Dopp, PA-C       Other Instructions NONE  Important Information About Sugar

## 2021-09-25 NOTE — Progress Notes (Signed)
Cardiology Office Note:    Date:  09/25/2021   ID:  [name], DOB 1945-04-21, MRN [id number]  PCP:  [name], MD  [facility] Cardiologist:  Werner Lean, MD  [place] Electrophysiologist:  None   CC: Follow up HFpEF  History of Present Illness:    [name] is a 76 y.o. female with a hx of Morbid Obesity,  HTN, HLD presened for evaluation 12/27/19.   2022: Had normal CMR (no HCM).  Stopped aldactone because of rash- see 03/23/20 note.  Lasix increased. Patient had hesitancy to start rosuvastatin 10 given husbands problems with atorvastatin in the past; did well. 2023: Started SGLT2i, her lasix had been dropped to one a day  Patient notes that she is doing ok.   Since last visit notes that she is still having some leg swelling. Saw Dr. Dennard Schaumann and is now on lasix twice a day.  Started on potassium medication.  Notes that she has ben having new chest pain.  This chest pain had gone into her shoulder  Chest pain occurred last week and felt like gas.  No other episodes of chest pain.    Breathing is the same.  Had worsening leg swelling and went back to lasix BID.  Has been on this for 1/5 months with improve leg swelling.  Ambulatory blood pressure 124/50s.  Past Medical History:  Diagnosis Date   Allergy    Arthritis    arthritis -knees   Colon polyps    benign- last colonoscopy 09-2008   Depression    GERD (gastroesophageal reflux disease)    hx GERD   H/O seasonal allergies    no problems now   Hyperlipidemia    Hypertension    Murmur, heart    Spinal stenosis of lumbar region at multiple levels     Past Surgical History:  Procedure Laterality Date   ABDOMINAL HYSTERECTOMY     APPENDECTOMY     with gallbladder surgery   BUNIONECTOMY Bilateral    CHOLECYSTECTOMY     open-gallstones   COLONOSCOPY     EXCISION OF ADNEXAL MASS Left August 03, 2013   at [facility]/right adnexal nodule,excision;benigh lymph node   OOPHORECTOMY  Bilateral 2008   ovarian cyst   POLYPECTOMY      Current Medications: Current Meds  Medication Sig   acetaminophen (TYLENOL) 650 MG CR tablet Take by mouth as needed for pain.   albuterol (VENTOLIN HFA) 108 (90 Base) MCG/ACT inhaler INHALE 2 PUFFS BY MOUTH EVERY 4 HOURS AS NEEDED FOR WHEEZING FOR SHORTNESS OF BREATH   diltiazem (CARDIZEM CD) 360 MG 24 hr capsule Take 1 capsule by mouth once daily   doxazosin (CARDURA) 2 MG tablet Take 1 tablet by mouth once daily   empagliflozin (JARDIANCE) 10 MG TABS tablet Take 1 tablet (10 mg total) by mouth daily before breakfast.   fluticasone (FLONASE) 50 MCG/ACT nasal spray Place 2 sprays into both nostrils daily.   furosemide (LASIX) 40 MG tablet Take 1.5 tablets (60 mg total) by mouth 2 (two) times daily.   lisinopril (ZESTRIL) 40 MG tablet Take 1 tablet by mouth once daily   loratadine (CLARITIN) 10 MG tablet Take 1 tablet by mouth daily.   Multiple Vitamins-Minerals (CENTRUM MINIS WOMEN 50+) TABS Take by mouth daily at 6 (six) AM.   naproxen (NAPROSYN) 500 MG tablet TAKE 1 TABLET BY MOUTH TWICE DAILY AS NEEDED FOR MILD PAIN   omeprazole (PRILOSEC) 40 MG capsule Take 1  capsule by mouth once daily (Patient taking differently: as needed (heartburn).)   Polyethylene Glycol 3350 (MIRALAX PO) Take by mouth as needed.   rosuvastatin (CRESTOR) 10 MG tablet Take 1 tablet by mouth once daily   [DISCONTINUED] furosemide (LASIX) 40 MG tablet Take 1 tablet by mouth twice daily   [DISCONTINUED] potassium chloride SA (KLOR-CON M) 20 MEQ tablet Take 1 tablet (20 mEq total) by mouth daily.     Allergies:   Aldactone [spironolactone]   Social History   Socioeconomic History   Marital status: Married    Spouse name: Not on file   Number of children: Not on file   Years of education: Not on file   Highest education level: Not on file  Occupational History   Not on file  Tobacco Use   Smoking status: Never   Smokeless tobacco: Never  Vaping Use    Vaping Use: Never used  Substance and Sexual Activity   Alcohol use: No   Drug use: No   Sexual activity: Yes  Other Topics Concern   Not on file  Social History Narrative   Not on file   Social Determinants of Health   Financial Resource Strain: Low Risk  (06/13/2021)   Overall Financial Resource Strain (CARDIA)    Difficulty of Paying Living Expenses: Not hard at all  Food Insecurity: No Food Insecurity (06/19/2021)   Hunger Vital Sign    Worried About Running Out of Food in the Last Year: Never true    [place] in the Last Year: Never true  Transportation Needs: No Transportation Needs (06/13/2021)   PRAPARE - Hydrologist (Medical): No    Lack of Transportation (Non-Medical): No  Physical Activity: Insufficiently Active (06/13/2021)   Exercise Vital Sign    Days of Exercise per Week: 3 days    Minutes of Exercise per Session: 30 min  Stress: Stress Concern Present (06/13/2021)   [place]    Feeling of Stress : To some extent  Social Connections: Socially Integrated (06/13/2021)   Social Connection and Isolation Panel [NHANES]    Frequency of Communication with Friends and Family: More than three times a week    Frequency of Social Gatherings with Friends and Family: Twice a week    Attends Religious Services: More than 4 times per year    Active Member of [employer] or Organizations: Yes    Attends Music therapist: More than 4 times per year    Marital Status: Married    Social: Talked about hiding an egg for teething -kids at our second visit.  We usually tell stories about kids.  Family History: The patient's family history includes Breast cancer in her paternal aunt; Colon cancer (age of onset: 41) in her mother; Colon cancer (age of onset: 53) in her brother; Colon polyps in her brother; Heart attack in her father. There is no history of Esophageal cancer,  Pancreatic cancer, Rectal cancer, or Stomach cancer. Father had MI and died at 24- no heart catheterization Son had MI and died at age 72- no heart catheterization Brother had CHF. No family history of hypertrophic cardiomyopathy. Brother- died in 47s.  ROS:   Please see the history of present illness.    All other systems reviewed and are negative.  EKGs/Labs/Other Studies Reviewed:    The following studies were reviewed today:  EKG:   03/11/21: SR rate 71 LVH 12/26/20 sinus  rhythm rate 79 no pathologic q waves  Transthoracic Echocardiogram: Date: 12/07/19 Results: Basal Septal Hypertrophy with Chordal SAM,Septal basal ratio ~ 1.3  1. Left ventricular ejection fraction, by estimation, is 60 to 65%. The  left ventricle has normal function. The left ventricle has no regional  wall motion abnormalities. There is severe asymmetric left ventricular  hypertrophy of the basal and septal  segments. Left ventricular diastolic parameters are consistent with Grade  I diastolic dysfunction (impaired relaxation).   2. Right ventricular systolic function is normal. The right ventricular  size is normal.   3. Left atrial size was mildly dilated.   4. The mitral valve is normal in structure. No evidence of mitral valve  regurgitation. No evidence of mitral stenosis.   5. The aortic valve is tricuspid. Aortic valve regurgitation is trivial.  Mild to moderate aortic valve sclerosis/calcification is present, without  any evidence of aortic stenosis.   6. The inferior vena cava is normal in size with greater than 50%  respiratory variability, suggesting right atrial pressure of 3 mmHg.   CMR: Date: 03/21/20 Results: IMPRESSION: 1. Normal left ventricular size, with asymmetric basal septal hypertrophy and normal systolic function (LVEF = 62%). There are no regional wall motion abnormalities and no late gadolinium enhancement in the left ventricular myocardium. ECV is 28%. The maximum  thickness of the basal septum is 14 mm, there is no LGE, no LVOT gradient or SAM.  These findings are not supportive of hypertrophic cardiomyopathy.  2. Normal right ventricular size, thickness and systolic function (RVEF = 62%). There are no regional wall motion abnormalities.  3. Mildly dilated left atrium, normal right atrial size.  4. Normal size of the aortic root, ascending aorta and pulmonary artery.  5.  No significant valvular abnormalities.  6.  Normal pericardium.  Trivial pericardial effusion.   Recent Labs: 12/31/2020: ALT 14; Hemoglobin 12.8; Platelets 209.0 07/26/2021: BUN 9; Creat 1.03; Potassium 3.3; Sodium 143  Recent Lipid Panel    Component Value Date/Time   CHOL 127 08/31/2020 0818   CHOL 128 07/12/2020 0753   TRIG 63 08/31/2020 0818   HDL 48 (L) 08/31/2020 0818   HDL 45 07/12/2020 0753   CHOLHDL 2.6 08/31/2020 0818   VLDL 20 10/29/2015 0902   LDLCALC 65 08/31/2020 0818    Physical Exam:    VS:  BP (!) 120/58   Pulse 69   Ht '5\' 3"'$  (1.6 m)   Wt 116.6 kg   SpO2 95%   BMI 45.53 kg/m     Wt Readings from Last 3 Encounters:  09/25/21 116.6 kg  07/26/21 116.6 kg  06/17/21 115.6 kg    GEN: Obese female, well developed in no acute distress HEENT: Normal LYMPHATICS: No lymphadenopathy CARDIAC: RRR, II/VI holosystolic murmur  no rubs, gallops  RESPIRATORY:  Clear to auscultation without rales, wheezing or rhonchi  ABDOMEN: Soft, non-tender, non-distended MUSCULOSKELETAL:  +2 edema bilaterally ; No deformity  SKIN: Warm and dry NEUROLOGIC:  Alert and oriented x 3 PSYCHIATRIC:  Normal affect   ASSESSMENT:    1. Chronic heart failure with preserved ejection fraction ([place])   2. Essential hypertension   3. Morbid obesity ([place])   4. Pure hypercholesterolemia     PLAN:    Heart Failure Preserved Ejection Fraction  Morbid Obesity Essential HTN - NYHA class II Stage C, hypervolemic , etiology from HTN and Obesity - Diuretic regimen: increase to  lasix 60 mg PO BID, increase K to 40 meq daily - Jardiance  10 - allergy to aldactone (itching), will defer eplerenone at this time - BMP and BNP in one week - repeat echo - may transition lisinopril to Entresto  Hyperlipidemia  -LDL goal less than 100  - continue rosuvastatin 10 mg   3-4 months me or APP  Medication Adjustments/Labs and Tests Ordered: Current medicines are reviewed at length with the patient today.  Concerns regarding medicines are outlined above.  Orders Placed This Encounter  Procedures   Basic metabolic panel   Pro b natriuretic peptide (BNP)   ECHOCARDIOGRAM COMPLETE    Meds ordered this encounter  Medications   furosemide (LASIX) 40 MG tablet    Sig: Take 1.5 tablets (60 mg total) by mouth 2 (two) times daily.    Dispense:  270 tablet    Refill:  3   potassium chloride SA (KLOR-CON M) 20 MEQ tablet    Sig: Take 2 tablets (40 mEq total) by mouth daily. 2 TABS IN THE AM 1 TAB IN THE PM    Dispense:  270 tablet    Refill:  3     Patient Instructions  Medication Instructions:  INCREASE FUROSEMIDE TO 60 MG TWICE DAILY  INCREASE POTASSIUM TO 40 MEQ IN THE AND 20 MEQ IN THE PM  *If you need a refill on your cardiac medications before your next appointment, please call your pharmacy*   Lab Work: BMET AND BNP IN 1 WEEK  If you have labs (blood work) drawn today and your tests are completely normal, you will receive your results only by: MyChart Message (if you have MyChart) OR A paper copy in the mail If you have any lab test that is abnormal or we need to change your treatment, we will call you to review the results.   Testing/Procedures: Your physician has requested that you have an echocardiogram. Echocardiography is a painless test that uses sound waves to create images of your heart. It provides your doctor with information about the size and shape of your heart and how well your heart's chambers and valves are working. This procedure takes  approximately one hour. There are no restrictions for this procedure.    Follow-Up: At [facility], you and your health needs are our priority.  As part of our continuing mission to provide you with exceptional heart care, we have created designated Provider Care Teams.  These Care Teams include your primary Cardiologist (physician) and Advanced Practice Providers (APPs -  Physician Assistants and Nurse Practitioners) who all work together to provide you with the care you need, when you need it.  We recommend signing up for the patient portal called "MyChart".  Sign up information is provided on this After Visit Summary.  MyChart is used to connect with patients for Virtual Visits (Telemedicine).  Patients are able to view lab/test results, encounter notes, upcoming appointments, etc.  Non-urgent messages can be sent to your provider as well.   To learn more about what you can do with MyChart, go to [email or web address].    Your next appointment:   3 month(s)  The format for your next appointment:   In Person  Provider:   Werner Lean, MD  or [address], PA-C, Nicholes Rough, PA-C, Melina Copa, PA-C, Ambrose Pancoast, NP, Ermalinda Barrios, PA-C, Christen Bame, NP, or Richardson Dopp, PA-C       Other Instructions NONE  Important Information About Sugar         Signed, Werner Lean, MD  09/25/2021 4:05 PM  [facility]

## 2021-09-26 DIAGNOSIS — M1990 Unspecified osteoarthritis, unspecified site: Secondary | ICD-10-CM

## 2021-09-26 DIAGNOSIS — F320 Major depressive disorder, single episode, mild: Secondary | ICD-10-CM

## 2021-09-26 DIAGNOSIS — I5032 Chronic diastolic (congestive) heart failure: Secondary | ICD-10-CM

## 2021-10-02 ENCOUNTER — Ambulatory Visit: Payer: Medicare HMO | Attending: Internal Medicine

## 2021-10-02 DIAGNOSIS — I5032 Chronic diastolic (congestive) heart failure: Secondary | ICD-10-CM

## 2021-10-02 DIAGNOSIS — I10 Essential (primary) hypertension: Secondary | ICD-10-CM | POA: Diagnosis not present

## 2021-10-03 LAB — PRO B NATRIURETIC PEPTIDE: NT-Pro BNP: 81 pg/mL (ref 0–301)

## 2021-10-03 LAB — BASIC METABOLIC PANEL
BUN/Creatinine Ratio: 10 — ABNORMAL LOW (ref 12–28)
BUN: 15 mg/dL (ref 8–27)
CO2: 27 mmol/L (ref 20–29)
Calcium: 9.5 mg/dL (ref 8.7–10.3)
Chloride: 98 mmol/L (ref 96–106)
Creatinine, Ser: 1.44 mg/dL — ABNORMAL HIGH (ref 0.57–1.00)
Glucose: 133 mg/dL — ABNORMAL HIGH (ref 70–99)
Potassium: 4.1 mmol/L (ref 3.5–5.2)
Sodium: 144 mmol/L (ref 134–144)
eGFR: 38 mL/min/{1.73_m2} — ABNORMAL LOW (ref >59)

## 2021-10-04 ENCOUNTER — Telehealth: Payer: Self-pay | Admitting: Pharmacist

## 2021-10-04 NOTE — Progress Notes (Signed)
Chronic Care Management Pharmacy Assistant   Name: [name]  MRN: [id number] DOB: January 20, 1946   Reason for Encounter: Hypertension Adherence Call   Recent office visits:  07/26/2021 OV (PCP) [name] Frizzle, MD; Patient appears fluid overloaded.  Begin Lasix 40 mg twice daily.  Add K door 20 mill equivalents once daily, continue Jardiance, recheck swelling in 1 week or sooner if worsening  06/17/2021 OV (PCP) [name] Frizzle, MD; I have recommended that we try adding a fiber supplement like Metamucil.  If that does not work we can even add cholestyramine to try to increase transit time and decrease fecal incontinence  Recent consult visits:  09/25/2021 OV (Cardiology) Rudean Haskell A, MD; increase to lasix 60 mg PO BID, increase K to 40 meq daily  Hospital visits:  None in previous 6 months  Medications: Outpatient Encounter Medications as of 10/04/2021  Medication Sig   acetaminophen (TYLENOL) 650 MG CR tablet Take by mouth as needed for pain.   albuterol (VENTOLIN HFA) 108 (90 Base) MCG/ACT inhaler INHALE 2 PUFFS BY MOUTH EVERY 4 HOURS AS NEEDED FOR WHEEZING FOR SHORTNESS OF BREATH   diltiazem (CARDIZEM CD) 360 MG 24 hr capsule Take 1 capsule by mouth once daily   doxazosin (CARDURA) 2 MG tablet Take 1 tablet by mouth once daily   empagliflozin (JARDIANCE) 10 MG TABS tablet Take 1 tablet (10 mg total) by mouth daily before breakfast.   fluticasone (FLONASE) 50 MCG/ACT nasal spray Place 2 sprays into both nostrils daily.   furosemide (LASIX) 40 MG tablet Take 1.5 tablets (60 mg total) by mouth 2 (two) times daily.   lisinopril (ZESTRIL) 40 MG tablet Take 1 tablet by mouth once daily   loratadine (CLARITIN) 10 MG tablet Take 1 tablet by mouth daily.   Multiple Vitamins-Minerals (CENTRUM MINIS WOMEN 50+) TABS Take by mouth daily at 6 (six) AM.   naproxen (NAPROSYN) 500 MG tablet TAKE 1 TABLET BY MOUTH TWICE DAILY AS NEEDED FOR MILD PAIN   omeprazole (PRILOSEC) 40 MG  capsule Take 1 capsule by mouth once daily (Patient taking differently: as needed (heartburn).)   Polyethylene Glycol 3350 (MIRALAX PO) Take by mouth as needed.   potassium chloride SA (KLOR-CON M) 20 MEQ tablet Take 2 tablets (40 mEq total) by mouth daily. 2 TABS IN THE AM 1 TAB IN THE PM   rosuvastatin (CRESTOR) 10 MG tablet Take 1 tablet by mouth once daily   No facility-administered encounter medications on file as of 10/04/2021.   Reviewed chart prior to disease state call. Spoke with patient regarding BP  Recent Office Vitals: BP Readings from Last 3 Encounters:  09/25/21 (!) 120/58  07/26/21 122/82  06/17/21 120/78   Pulse Readings from Last 3 Encounters:  09/25/21 69  07/26/21 91  06/17/21 82    Wt Readings from Last 3 Encounters:  09/25/21 257 lb (116.6 kg)  07/26/21 257 lb (116.6 kg)  06/17/21 254 lb 12.8 oz (115.6 kg)     Kidney Function Lab Results  Component Value Date/Time   CREATININE 1.44 (H) 10/02/2021 03:35 PM   CREATININE 1.03 (H) 07/26/2021 09:19 AM   CREATININE 1.03 (H) 04/11/2021 09:25 AM   CREATININE 0.99 08/31/2020 08:18 AM   GFR 53.92 (L) 12/31/2020 12:11 PM   GFRNONAA 54 (L) 01/04/2020 03:06 PM   GFRNONAA 68 11/07/2019 12:28 PM   GFRAA 62 01/04/2020 03:06 PM   GFRAA 79 11/07/2019 12:28 PM       Latest Ref Rng &  Units 10/02/2021    3:35 PM 07/26/2021    9:19 AM 04/11/2021    9:25 AM  BMP  Glucose 70 - 99 mg/dL 133  118  123   BUN 8 - 27 mg/dL '15  9  12   '$ Creatinine 0.57 - 1.00 mg/dL 1.44  1.03  1.03   BUN/Creat Ratio 12 - '28 10  9  12   '$ Sodium 134 - 144 mmol/L 144  143  145   Potassium 3.5 - 5.2 mmol/L 4.1  3.3  4.0   Chloride 96 - 106 mmol/L 98  102  102   CO2 20 - 29 mmol/L '27  31  28   '$ Calcium 8.7 - 10.3 mg/dL 9.5  9.4  9.5     Current antihypertensive regimen:  Lisinopril 40 mg daily Furosemide 40 mg 1.5 tablet twice daily  How often are you checking your Blood Pressure? several times per month  Current home BP readings:  120's/60's  What recent interventions/DTPs have been made by any provider to improve Blood Pressure control since last CPP Visit: on 09/25/2021 -increase to lasix 60 mg PO BID, increase K to 40 meq daily  Any recent hospitalizations or ED visits since last visit with CPP? No  What diet changes have been made to improve Blood Pressure Control?  Patient states "I don't eat like I should." ` What exercise is being done to improve your Blood Pressure Control?  Patient states she likes to walk some days.  Adherence Review: Is the patient currently on ACE/ARB medication? Yes Does the patient have >5 day gap between last estimated fill dates? No   Care Gaps: Medicare Annual Wellness: Completed 06/13/2021 Hemoglobin A1C: none available Colonoscopy: Next due on 12/15/2023 Dexa Scan: Completed Mammogram: Next due on 04/27/2022  Future Appointments  Date Time Provider [place]  10/08/2021 10:00 AM [facility] SOCIAL [facility]  10/10/2021  8:35 AM [facility]-CV [facility] ECHO 1 [id number]  12/02/2021  8:00 AM Conception Chancy, PsyD [facility]  12/27/2021  9:20 AM Werner Lean, MD [facility]  06/19/2022  8:15 AM BSFM-NURSE HEALTH ADVISOR [facility]   Star Rating Drugs: Jardiance 10 mg last filled 09/26/2021 30 DS Lisinopril 40 mg last filled 08/25/2021 90 DS Rosuvastatin 10 mg last filled 09/24/2021 90 DS  April D Calhoun, [place] Pharmacist Assistant [phone]

## 2021-10-08 ENCOUNTER — Telehealth: Payer: Medicare HMO

## 2021-10-10 ENCOUNTER — Ambulatory Visit (HOSPITAL_COMMUNITY): Payer: Medicare HMO | Attending: Internal Medicine

## 2021-10-10 DIAGNOSIS — I5032 Chronic diastolic (congestive) heart failure: Secondary | ICD-10-CM | POA: Insufficient documentation

## 2021-10-10 LAB — ECHOCARDIOGRAM COMPLETE
Area-P 1/2: 3.72 cm2
P 1/2 time: 572 msec
S' Lateral: 3 cm

## 2021-10-15 ENCOUNTER — Other Ambulatory Visit: Payer: Self-pay | Admitting: Family Medicine

## 2021-10-29 NOTE — Progress Notes (Signed)
Chronic Care Management Pharmacy Note Summary: PharmD FU.  Increased diuretics have not really changed her fluid status.  Now taking 54m twice daily and still noticing swelling.  Tolerating meds well, has stopped naproxen due to declining renal function.  Recommendations: No changes at this time.  FU 6 months  11/08/2021 Name:  [name] NYLANDERMRN:  [id number]:  [birth year]  Subjective: TSHUNDRA WIRSINGis an 76y.o. year old female who is a primary patient of Pickard, WCammie Mcgee MD.  The CCM team was consulted for assistance with disease management and care coordination needs.    Engaged with patient by telephone for follow up visit in response to provider referral for pharmacy case management and/or care coordination services.   Consent to Services:  The patient was given the following information about Chronic Care Management services today, agreed to services, and gave verbal consent: 1. CCM service includes personalized support from designated clinical staff supervised by the primary care provider, including individualized plan of care and coordination with other care providers 2. 24/7 contact phone numbers for assistance for urgent and routine care needs. 3. Service will only be billed when office clinical staff spend 20 minutes or more in a month to coordinate care. 4. Only one practitioner may furnish and bill the service in a calendar month. 5.The patient may stop CCM services at any time (effective at the end of the month) by phone call to the office staff. 6. The patient will be responsible for cost sharing (co-pay) of up to 20% of the service fee (after annual deductible is met). Patient agreed to services and consent obtained.  Patient Care Team: PSusy Frizzle MD as PCP - General (Family Medicine) CWerner Lean MD as PCP - Cardiology (Cardiology) DEdythe Clarity R[facility] Hospitalas Pharmacist (Pharmacist) CDeirdre Peer LCSW as Social Worker (Licensed Clinical  Social Worker)  Recent office visits:  07/26/2021 OV (PCP) PSusy Frizzle MD; Patient appears fluid overloaded.  Begin Lasix 40 mg twice daily.  Add K door 20 mill equivalents once daily, continue Jardiance, recheck swelling in 1 week or sooner if worsening   06/17/2021 OV (PCP) PSusy Frizzle MD; I have recommended that we try adding a fiber supplement like Metamucil.  If that does not work we can even add cholestyramine to try to increase transit time and decrease fecal incontinence   Recent consult visits:  09/25/2021 [facility] (Cardiology) CRudean HaskellA, MD; increase to lasix 60 mg PO BID, increase K to 40 meq daily   Hospital visits:  None in previous 6 months   Objective:  Lab Results  Component Value Date   CREATININE 1.44 (H) 10/02/2021   BUN 15 10/02/2021   GFR 53.92 (L) 12/31/2020   GFRNONAA 54 (L) 01/04/2020   GFRAA 62 01/04/2020   NA 144 10/02/2021   K 4.1 10/02/2021   CALCIUM 9.5 10/02/2021   CO2 27 10/02/2021   GLUCOSE 133 (H) 10/02/2021    Lab Results  Component Value Date/Time   GFR 53.92 (L) 12/31/2020 12:11 PM    Last diabetic Eye exam: No results found for: "HMDIABEYEEXA"  Last diabetic Foot exam: No results found for: "HMDIABFOOTEX"   Lab Results  Component Value Date   CHOL 127 08/31/2020   HDL 48 (L) 08/31/2020   LDLCALC 65 08/31/2020   TRIG 63 08/31/2020   CHOLHDL 2.6 08/31/2020       Latest Ref Rng & Units 12/31/2020   12:11 PM 08/31/2020  8:18 AM 07/12/2020    7:53 AM  Hepatic Function  Total Protein 6.0 - 8.3 g/dL 7.0  6.4  6.5   Albumin 3.5 - 5.2 g/dL 4.3   4.5   AST 0 - 37 U/L _0 ALT 0 - 35 U/L _1 Alk Phosphatase 39 - 117 U/L 41   57   Total Bilirubin 0.2 - 1.2 mg/dL 0.5  0.3  0.4   Bilirubin, Direct 0.00 - 0.40 mg/dL   0.13     Lab Results  Component Value Date/Time   TSH 2.020 12/27/2019 09:54 AM   TSH 1.88 10/29/2015 09:02 AM       Latest Ref Rng & Units 12/31/2020   12:11 PM 08/31/2020    8:18  AM 12/27/2019    9:54 AM  CBC  WBC 4.0 - 10.5 K/uL 8.3  7.4  7.0   Hemoglobin 12.0 - 15.0 g/dL 12.8  12.5  13.3   Hematocrit 36.0 - 46.0 % 39.3  38.6  38.9   Platelets 150.0 - 400.0 K/uL 209.0  222  202     No results found for: "VD25OH"  Clinical ASCVD: No  The ASCVD Risk score (Arnett DK, et al., 2019) failed to calculate for the following reasons:   The valid total cholesterol range is 130 to 320 mg/dL       06/18/2021    1:11 PM 06/13/2021    8:19 AM 09/04/2020   10:37 AM  Depression screen PHQ 2/9  Decreased Interest 2 0 1  Down, Depressed, Hopeless _2 PHQ - 2 Score _3 Altered sleeping 0 0 2  Tired, decreased energy 1 0 3  Change in appetite _4 Feeling bad or failure about yourself  0 0 2  Trouble concentrating 0 0 1  Moving slowly or fidgety/restless 1 0 0  Suicidal thoughts 0 0 0  PHQ-9 Score _5 Difficult doing work/chores Somewhat difficult Somewhat difficult      Social History   Tobacco Use  Smoking Status Never  Smokeless Tobacco Never   BP Readings from Last 3 Encounters:  09/25/21 (!) 120/58  07/26/21 122/82  06/17/21 120/78   Pulse Readings from Last 3 Encounters:  09/25/21 69  07/26/21 91  06/17/21 82   Wt Readings from Last 3 Encounters:  09/25/21 257 lb (116.6 kg)  07/26/21 257 lb (116.6 kg)  06/17/21 254 lb 12.8 oz (115.6 kg)   BMI Readings from Last 3 Encounters:  09/25/21 45.53 kg/m  07/26/21 44.11 kg/m  06/17/21 43.74 kg/m    Assessment/Interventions: Review of patient past medical history, allergies, medications, health status, including review of consultants reports, laboratory and other test data, was performed as part of comprehensive evaluation and provision of chronic care management services.   SDOH:  (Social Determinants of Health) assessments and interventions performed: No, done within the year Financial Resource Strain: Low Risk  (06/13/2021)   Overall Financial Resource Strain (CARDIA)    Difficulty of  Paying Living Expenses: Not hard at all    SDOH Interventions    Flowsheet Row Telephone from 06/19/2021 in [place] Management from 06/18/2021 in [place] from 06/13/2021 in [place] from 05/31/2020 in [place] Interventions      Food Insecurity Interventions Other (Comment)  Dahlia Bailiff list of food  pantries] -- Intervention Not Indicated Intervention Not Indicated  Housing Interventions -- -- Intervention Not Indicated Intervention Not Indicated  Transportation Interventions -- -- Intervention Not Indicated Intervention Not Indicated  Depression Interventions/Treatment  -- Counseling Counseling Counseling  Financial Strain Interventions -- -- Intervention Not Indicated Intervention Not Indicated  Physical Activity Interventions -- -- Other (Comments) Intervention Not Indicated  Stress Interventions -- -- Provide Counseling Intervention Not Indicated  Social Connections Interventions -- Local [employer], Other (Comment) Intervention Not Indicated Intervention Not Indicated      Financial Resource Strain: Low Risk  (06/13/2021)   Overall Financial Resource Strain (CARDIA)    Difficulty of Paying Living Expenses: Not hard at all    [place]: No Food Insecurity (06/19/2021)  Housing: Low Risk  (06/13/2021)  Transportation Needs: No Transportation Needs (06/13/2021)  Alcohol Screen: Low Risk  (06/13/2021)  Depression (PHQ2-9): Medium Risk (06/18/2021)  Financial Resource Strain: Low Risk  (06/13/2021)  Physical Activity: Insufficiently Active (06/13/2021)  Social Connections: Socially Integrated (06/13/2021)  Stress: Stress Concern Present (06/13/2021)  Tobacco Use: Low Risk  (09/25/2021)    [place]  Allergies  Allergen Reactions   Aldactone [Spironolactone] Rash    Medications Reviewed Today     Reviewed by Edythe Clarity, [facility]  (Pharmacist) on 11/08/21 at 1042  Med List Status: <None>   Medication Order Taking? Sig Documenting Provider Last Dose Status Informant  acetaminophen (TYLENOL) 650 MG CR tablet [id number] Yes Take by mouth as needed for pain. [provider] Taking Active   albuterol (VENTOLIN HFA) 108 (90 Base) MCG/ACT inhaler [id number] Yes INHALE 2 PUFFS BY MOUTH EVERY 4 HOURS AS NEEDED FOR WHEEZING FOR SHORTNESS OF BREATH [name], MD Taking Active   diltiazem (CARDIZEM CD) 360 MG 24 hr capsule [id number] Yes Take 1 capsule by mouth once daily [name], MD Taking Active   doxazosin (CARDURA) 2 MG tablet [id number] Yes Take 1 tablet by mouth once daily [name], MD Taking Active   empagliflozin (JARDIANCE) 10 MG TABS tablet [id number] Yes Take 1 tablet (10 mg total) by mouth daily before breakfast. Werner Lean, MD Taking Active   fluticasone (FLONASE) 50 MCG/ACT nasal spray [id number] Yes Place 2 sprays into both nostrils daily. Eulogio Bear, NP Taking Active   furosemide (LASIX) 40 MG tablet [id number] Yes Take 1.5 tablets (60 mg total) by mouth 2 (two) times daily. Werner Lean, MD Taking Active   lisinopril (ZESTRIL) 40 MG tablet [id number] Yes Take 1 tablet by mouth once daily Chandrasekhar, Mahesh A, MD Taking Active   loratadine (CLARITIN) 10 MG tablet [id number] Yes Take 1 tablet by mouth daily. [provider] Taking Active   Multiple Vitamins-Minerals (CENTRUM MINIS WOMEN 50+) TABS [id number] Yes Take by mouth daily at 6 (six) AM. [provider] Taking Active   naproxen (NAPROSYN) 500 MG tablet [id number] Yes TAKE 1 TABLET BY MOUTH TWICE DAILY AS NEEDED FOR MILD PAIN [name], MD Taking Active   omeprazole (PRILOSEC) 40 MG capsule [id number] Yes Take 1 capsule by mouth once daily  Patient taking differently: as needed (heartburn).   [name], MD Taking Active   Polyethylene Glycol 3350 (MIRALAX PO) [id number] Yes  Take by mouth as needed. [provider] Taking Active   potassium chloride SA (KLOR-CON M) 20 MEQ tablet [id number] Yes Take 2 tablets (40 mEq total) by mouth daily. 2 TABS IN THE AM 1 TAB IN THE PM Bruno, Mahesh  A, MD Taking Active   rosuvastatin (CRESTOR) 10 MG tablet [id number] Yes Take 1 tablet by mouth once daily Werner Lean, MD Taking Active             Patient Active Problem List   Diagnosis Date Noted   Pincer nail deformity 04/16/2021   Diverticulitis 12/27/2020   History of hysterectomy 12/27/2020   Morbid obesity ([place]) 03/23/2020   Heart failure with preserved ejection fraction ([place]) 12/27/2019   Bilateral knee pain 02/15/2019   Spinal stenosis of lumbar region at multiple levels    Arthropathia 09/23/2013   Benign neoplasm of colon 09/23/2013   Essential hypertension 09/23/2013   SBO (small bowel obstruction) ([place]) 08/17/2013   Pelvic mass 07/05/2013   Pain in joint, pelvic region and thigh 07/05/2013   Hyperlipidemia    Arthritis    Colon polyps     Immunization History  Administered Date(s) Administered   Fluad Quad(high Dose 65+) 10/06/2018, 11/07/2019, 11/14/2020, 11/04/2021   Influenza, High Dose Seasonal PF 12/03/2017   Influenza,inj,Quad PF,6+ Mos 11/06/2015, 11/03/2016   PFIZER(Purple Top)SARS-COV-2 Vaccination 02/15/2019, 03/07/2019, 12/05/2019   Pneumococcal Conjugate-13 10/20/2014   Pneumococcal Polysaccharide-23 06/17/2012    Conditions to be addressed/monitored:  HTN, HFpEF, Arthritis, HLD  Care Plan : General Pharmacy (Adult)  Updates made by Edythe Clarity, RPH since 11/08/2021 12:00 AM     Problem: HTN, HFpEF, Arthritis, HLD   Priority: High  Onset Date: 07/09/2020     Long-Range Goal: Patient-Specific Goal   Start Date: 07/09/2020  Expected End Date: 01/09/2021  Recent Progress: On track  Priority: High  Note:   Current Barriers:  Fluid status  Pharmacist Clinical Goal(s):  Patient will achieve  adherence to monitoring guidelines and medication adherence to achieve therapeutic efficacy contact provider office for questions/concerns as evidenced notation of same in electronic health record through collaboration with PharmD and provider.   Interventions: 1:1 collaboration with [name], MD regarding development and update of comprehensive plan of care as evidenced by provider attestation and co-signature Inter-disciplinary care team collaboration (see longitudinal plan of care) Comprehensive medication review performed; medication list updated in electronic medical record  Hypertension (BP goal <140/90) -Controlled -Current treatment: Lisinopril 40mg  daily Diltiazem CD 360mg  24hr daily Doxazosin 2mg  daily -Medications previously tried: HCTZ  -Current home readings: 127/80 -Current exercise habits: minimal -Denies hypotensive/hypertensive symptoms -Educated on BP goals and benefits of medications for prevention of heart attack, stroke and kidney damage; Exercise goal of 150 minutes per week; Importance of home blood pressure monitoring; Symptoms of hypotension and importance of maintaining adequate hydration; -Counseled to monitor BP at home a few times per week, document, and provide log at future appointments -Counseled on diet and exercise extensively Recommended to continue current medication Recommended she participate in water aerobics or water exercise of some kind using silver sneakers or at the Aquatic center.  Update 11/15/20 135/88 BP today she reports it has been controlled around this area for the most part at home.  Denies any symptoms. She has not started her aerobics at the aquatic center but still plans to do so.   Continue current meds for now, reinforced importance of physical activity and effect on BP.  Continue current meds  Hyperlipidemia: (LDL goal < 100) -Uncontrolled -Current treatment: Rosuvastatin 10mg  daily -Medications previously tried:  none noted  -Educated on Cholesterol goals;  Benefits of statin for ASCVD risk reduction; Importance of limiting foods high in cholesterol; Strategies to manage statin-induced myalgias; -Started statin after most  recent lipid panel -She is tolerating it well Has appointment on the 16th to recheck lipids -Counseled on diet and exercise extensively Recommended to continue current medication Will follow up on lipids next month to check efficacy.  Update 11/15/20 Most recent lipid panel from August shows improved lipids!  LDL down to 65 from a previous result of 119.  Congratulated patient in her efforts.  Continues to be adherent with medication and denies any adverse effects as a result of taking this med.  Continue current meds for now.  Heart Failure (Goal: manage symptoms and prevent exacerbations) 11/07/21 -Controlled -Last ejection fraction: 60-65% -Current treatment: Furosemide 23m twice daily Appropriate, Query effective, Jardiance 169mAppropriate, Effective, Safe, Accessible -Medications previously tried: none noted  -Current home BP/HR readings: 128/70 -Educated on Benefits of medications for managing symptoms and prolonging life Importance of weighing daily; if you gain more than 3 pounds in one day or 5 pounds in one week, contact providers Importance of blood pressure control She reports that she has constant swelling in her ankles.  Despite increases in Lasix, she reports she does not have increased volume of urine output.  She denies any shortness of breath.  She is currently monitoring weight and reports steady weight. Could consider additional diuretic therapy.  I asked her to call usKoreaf increased fluid/SOB noticed or severe weight gain.  Arthritis (Goal: Minimize symptoms) 11/07/21 Controlled -Current treatment  Tylenol 65056mR Appropriate, Effective, Safe, Accessible -Medications previously tried: none noted -Still having some pain  -Counseled on diet and  exercise extensively Recommended to continue current medication -Patient stopped her Naproxen due to declining renal function.  Now taking only Tylenol.  Counseled on avoidance of other NSAIDs.  Pain relatively managed at this time. No changes, continue to monitor renal function.  Patient Goals/Self-Care Activities Patient will:  - take medications as prescribed focus on medication adherence by fill dates check blood pressure a few times per week, document, and provide at future appointments Work to get physical activity at somewhere such as the aquatic center.  Follow Up Plan: The care management team will reach out to the patient again over the next 180 days.             Medication Assistance: None required.  Patient affirms current coverage meets needs.  Compliance/Adherence/Medication fill history: Care Gaps: Shingrix  Star Meds: Lisinopril 69m42m/30/23 90ds Rosuvastatin 10mg49m29/23 90ds   Patient's preferred pharmacy is:  [facility] [place] - [address] [id number]: [phone] endorses 100% compliance  We discussed: Benefits of medication synchronization, packaging and delivery as well as enhanced pharmacist oversight with [employer]. Patient decided to: Continue current medication management strategy  Care Plan and Follow Up Patient Decision:  Patient agrees to Care Plan and Follow-up.  Plan: The care management team will reach out to the patient again over the next 120 days.  ChrisBeverly MilchrmD Clinical Pharmacist [phone]

## 2021-11-04 ENCOUNTER — Ambulatory Visit (INDEPENDENT_AMBULATORY_CARE_PROVIDER_SITE_OTHER): Payer: Medicare HMO

## 2021-11-04 DIAGNOSIS — Z23 Encounter for immunization: Secondary | ICD-10-CM

## 2021-11-07 ENCOUNTER — Ambulatory Visit (INDEPENDENT_AMBULATORY_CARE_PROVIDER_SITE_OTHER): Payer: Medicare HMO | Admitting: Pharmacist

## 2021-11-07 ENCOUNTER — Telehealth: Payer: Self-pay | Admitting: *Deleted

## 2021-11-07 DIAGNOSIS — I5032 Chronic diastolic (congestive) heart failure: Secondary | ICD-10-CM

## 2021-11-07 DIAGNOSIS — M1990 Unspecified osteoarthritis, unspecified site: Secondary | ICD-10-CM

## 2021-11-07 NOTE — Patient Outreach (Signed)
  Care Coordination   11/07/2021 Name: [name] MRN: [id number] DOB: 1945/11/11   Care Coordination Outreach Attempts:  A second unsuccessful outreach was attempted today to offer the patient with information about available care coordination services as a benefit of their health plan.     Follow Up Plan:  Additional outreach attempts will be made to offer the patient care coordination information and services.   Encounter Outcome:  No Answer  Care Coordination Interventions Activated:  No   Care Coordination Interventions:  No, not indicated    Eduard Clos MSW, LCSW Licensed Clinical Social Worker      [phone]

## 2021-11-08 NOTE — Patient Instructions (Addendum)
Visit Information   Goals Addressed             This Visit's Progress    Track and Manage My Blood Pressure-Hypertension   On track    Timeframe:  Long-Range Goal Priority:  High Start Date:   07/10/20                          Expected End Date: 01/09/21                      Follow Up Date 10/26/20    - check blood pressure 3 times per week - choose a place to take my blood pressure (home, clinic or office, retail store) - write blood pressure results in a log or diary    Why is this important?   You won't feel high blood pressure, but it can still hurt your blood vessels.  High blood pressure can cause heart or kidney problems. It can also cause a stroke.  Making lifestyle changes like losing a little weight or eating less salt will help.  Checking your blood pressure at home and at different times of the day can help to control blood pressure.  If the doctor prescribes medicine remember to take it the way the doctor ordered.  Call the office if you cannot afford the medicine or if there are questions about it.     Notes:        Patient Care Plan: General Pharmacy (Adult)     Problem Identified: HTN, HFpEF, Arthritis, HLD   Priority: High  Onset Date: 07/09/2020     Long-Range Goal: Patient-Specific Goal   Start Date: 07/09/2020  Expected End Date: 01/09/2021  Recent Progress: On track  Priority: High  Note:   Current Barriers:  Fluid status  Pharmacist Clinical Goal(s):  Patient will achieve adherence to monitoring guidelines and medication adherence to achieve therapeutic efficacy contact provider office for questions/concerns as evidenced notation of same in electronic health record through collaboration with PharmD and provider.   Interventions: 1:1 collaboration with [name], MD regarding development and update of comprehensive plan of care as evidenced by provider attestation and co-signature Inter-disciplinary care team collaboration (see  longitudinal plan of care) Comprehensive medication review performed; medication list updated in electronic medical record  Hypertension (BP goal <140/90) -Controlled -Current treatment: Lisinopril '40mg'$  daily Diltiazem CD '360mg'$  24hr daily Doxazosin '2mg'$  daily -Medications previously tried: HCTZ  -Current home readings: 127/80 -Current exercise habits: minimal -Denies hypotensive/hypertensive symptoms -Educated on BP goals and benefits of medications for prevention of heart attack, stroke and kidney damage; Exercise goal of 150 minutes per week; Importance of home blood pressure monitoring; Symptoms of hypotension and importance of maintaining adequate hydration; -Counseled to monitor BP at home a few times per week, document, and provide log at future appointments -Counseled on diet and exercise extensively Recommended to continue current medication Recommended she participate in water aerobics or water exercise of some kind using silver sneakers or at the Aquatic center.  Update 11/15/20 135/88 BP today she reports it has been controlled around this area for the most part at home.  Denies any symptoms. She has not started her aerobics at the aquatic center but still plans to do so.   Continue current meds for now, reinforced importance of physical activity and effect on BP.  Continue current meds  Hyperlipidemia: (LDL goal < 100) -Uncontrolled -Current treatment: Rosuvastatin '10mg'$  daily -Medications previously  tried: none noted  -Educated on Cholesterol goals;  Benefits of statin for ASCVD risk reduction; Importance of limiting foods high in cholesterol; Strategies to manage statin-induced myalgias; -Started statin after most recent lipid panel -She is tolerating it well Has appointment on the 16th to recheck lipids -Counseled on diet and exercise extensively Recommended to continue current medication Will follow up on lipids next month to check efficacy.  Update  11/15/20 Most recent lipid panel from August shows improved lipids!  LDL down to 65 from a previous result of 119.  Congratulated patient in her efforts.  Continues to be adherent with medication and denies any adverse effects as a result of taking this med.  Continue current meds for now.  Heart Failure (Goal: manage symptoms and prevent exacerbations) 11/07/21 -Controlled -Last ejection fraction: 60-65% -Current treatment: Furosemide '60mg'$  twice daily Appropriate, Query effective, Jardiance '10mg'$  Appropriate, Effective, Safe, Accessible -Medications previously tried: none noted  -Current home BP/HR readings: 128/70 -Educated on Benefits of medications for managing symptoms and prolonging life Importance of weighing daily; if you gain more than 3 pounds in one day or 5 pounds in one week, contact providers Importance of blood pressure control She reports that she has constant swelling in her ankles.  Despite increases in Lasix, she reports she does not have increased volume of urine output.  She denies any shortness of breath.  She is currently monitoring weight and reports steady weight. Could consider additional diuretic therapy.  I asked her to call us if increased fluid/SOB noticed or severe weight gain.  Arthritis (Goal: Minimize symptoms) 11/07/21 Controlled -Current treatment  Tylenol '650mg'$  CR Appropriate, Effective, Safe, Accessible -Medications previously tried: none noted -Still having some pain  -Counseled on diet and exercise extensively Recommended to continue current medication -Patient stopped her Naproxen due to declining renal function.  Now taking only Tylenol.  Counseled on avoidance of other NSAIDs.  Pain relatively managed at this time. No changes, continue to monitor renal function.  Patient Goals/Self-Care Activities Patient will:  - take medications as prescribed focus on medication adherence by fill dates check blood pressure a few times per week, document,  and provide at future appointments Work to get physical activity at somewhere such as the aquatic center.  Follow Up Plan: The care management team will reach out to the patient again over the next 180 days.         Patient Care Plan: LCSW Plan of Care     Problem Identified: Depression      Long-Range Goal: Improve quality of life and reduce depression symptoms   Start Date: 06/18/2021  Expected End Date: 09/25/2021  This Visit's Progress: On track  Recent Progress: On track  Priority: High  Note:   Current Barriers:  Transportation, Mental Health Concerns , and Lacks knowledge of community resource:    No Advanced Directives in place Lacks knowledge of how to connect   [place]):  Patient  will patient will work with SW to address concerns related to depression, grief/loss and re-connecting/socialization  through collaboration with [place] representative, provider, and care team.   Interventions: 09/10/21- Pt reports she had her first in-person counseling session today and it went well. She is planning to [facility] tomorrow and is considering joining-  plan follow up with pt 09/17/21 to complete PHQ9 and assess for further needs. 08/06/21- Pt shared that yesterday was the 5th anniversary of her son passing away. "I did better this year.....did not sit around crying  and think it's getting better". She recently had a 2 week visit from her son's wife/widow and her grandson- "he was 85years old when his dad passed". She enjoyed and appreciated their visit.  Pt is also looking into options for getting connected with activity/exercise and may join the Y program. "I enjoy the pool". She is also set up for her initial counseling session on 09/04/21.   Pt also reviewing additional resources sent to her by CSW and Care guide.   07/16/21-CSW spoke with pt for follow up today- she has received the paperwork from [facility] for outpatient counseling and plans to complete and take to them  along with her insurance card. Pt also indicates she has received the resources mailed to her and plans to review this and consider-    CSW made initial outreach call to pt today to assess reported depression. Pt admits to dealing with lack of interest, poor PO intake and even some "hopelessness". Pt denies SI/HI. She reports no history of medication or hospitalization for depression. Pt shared she lost a son about 5 years ago; "Quita Skye passed away in 08-25-2016".  CSW allowed pt to share and express her grief and loss. Pt shared [facility] few [facility] and it was helpful. She is interested in seeking counseling and CSW will seek options in-network for her.  Pt is also interested in pursuing some community based activities; discussed the Radio producer, volunteer work and Visual merchandiser as opportunities to consider. Pt seems interested and also may get re-connected with Silver Sneakers.  Pt does not have a HCPOA- will have packet mailed to her to review and complete as she desires.    1:1 collaboration with primary care provider regarding development and update of comprehensive plan of care as evidenced by provider attestation and co-signature Inter-disciplinary care team collaboration (see longitudinal plan of care) Evaluation of current treatment plan related to  self management and patient's adherence to plan as established by provider Review resources, discussed options and provided patient information about  Community food options  Referral to care guide (food pantry sites,  Advertising account planner, Senior activity programs, Tax adviser,)    Mental Health:  (Status: New goal.) Evaluation of current treatment plan related to Depression: depressed mood loss of energy/fatigue decreased appetite and Grief Motivational Interviewing employed Depression screen reviewed  PHQ2/ PHQ9 completed Solution-Focused Strategies employed:  Active  listening / Reflection utilized  Problem [place] strategies reviewed  and  Social Determinants of Health in Patient with Depression: depressed mood hopelessness and Grief:  (Status: New goal.) SDOH assessments completed: Landscape architect , Social Connections, Stress, and Physical Activity Evaluation of current treatment plan related to unmet needs Community food options (sites), Referral to care guide (food pantry sites,  Advertising account planner, Psychologist, clinical programs, Tax adviser,) ) , Solution-Focused Strategies employed: , and Engineer, petroleum Provided  Task & activities to accomplish goals: Continue with compliance of taking medication prescribed by Doctor Continue with your self-care action plan (seeking community activities; senior center, Environmental education officer, church, Social research officer, government) -continue with your counseling visits - tour [facility] program and seek information on classes, etc -consider local Y's as well for exercise/activities -review the resource materials received- consider getting involved with these!            The patient verbalized understanding of instructions, educational materials, and care plan provided today and DECLINED offer to receive copy of patient instructions, educational materials, and care  plan.  Telephone follow up appointment with pharmacy team member scheduled for: 6 months  Edythe Clarity, [place], PharmD, [place] Clinical Pharmacist Practitioner [place] [phone]

## 2021-11-22 ENCOUNTER — Other Ambulatory Visit: Payer: Self-pay | Admitting: Internal Medicine

## 2021-11-26 DIAGNOSIS — M1990 Unspecified osteoarthritis, unspecified site: Secondary | ICD-10-CM | POA: Diagnosis not present

## 2021-11-26 DIAGNOSIS — I5030 Unspecified diastolic (congestive) heart failure: Secondary | ICD-10-CM

## 2021-11-26 DIAGNOSIS — I10 Essential (primary) hypertension: Secondary | ICD-10-CM

## 2021-11-26 DIAGNOSIS — E785 Hyperlipidemia, unspecified: Secondary | ICD-10-CM | POA: Diagnosis not present

## 2021-11-27 ENCOUNTER — Other Ambulatory Visit: Payer: Self-pay | Admitting: Family Medicine

## 2021-11-27 ENCOUNTER — Ambulatory Visit: Payer: Self-pay | Admitting: *Deleted

## 2021-11-27 NOTE — Telephone Encounter (Signed)
Requested Prescriptions  Pending Prescriptions Disp Refills  . diltiazem (CARDIZEM CD) 360 MG 24 hr capsule [Pharmacy Med Name: dilTIAZem HCl ER Coated Beads 360 MG Oral Capsule Extended Release 24 Hour] 90 capsule 0    Sig: Take 1 capsule by mouth once daily     Cardiovascular: Calcium Channel Blockers 3 Failed - 11/27/2021 10:05 AM      Failed - Cr in normal range and within 360 days    Creat  Date Value Ref Range Status  07/26/2021 1.03 (H) 0.60 - 1.00 mg/dL Final   Creatinine, Ser  Date Value Ref Range Status  10/02/2021 1.44 (H) 0.57 - 1.00 mg/dL Final         Passed - ALT in normal range and within 360 days    ALT  Date Value Ref Range Status  12/31/2020 14 0 - 35 U/L Final         Passed - AST in normal range and within 360 days    AST  Date Value Ref Range Status  12/31/2020 17 0 - 37 U/L Final         Passed - Last BP in normal range    BP Readings from Last 1 Encounters:  09/25/21 (!) 120/58         Passed - Last Heart Rate in normal range    Pulse Readings from Last 1 Encounters:  09/25/21 69         Passed - Valid encounter within last 6 months    Recent Outpatient Visits          5 months ago Change in bowel habits   [place] [name], MD   7 months ago Ingrown thumb nail, right   [place] Pickard, [name], MD   1 year ago General medical exam   [place] [name], MD   1 year ago Sinus pressure   [place] Eulogio Bear, NP   2 years ago Spinal stenosis of lumbar region at multiple levels   [place], [name], MD      Future Appointments            In 1 month Gas[place] Sells, Terisa Starr, MD [place]. [facility], [facility]

## 2021-11-27 NOTE — Patient Instructions (Signed)
Visit Information  Thank you for taking time to visit with me today. Please don't hesitate to contact me if I can be of assistance to you.   Following are the goals we discussed today:   Goals Addressed               This Visit's Progress     COMPLETED: Begin and Stick with Counseling-Depression (pt-stated)        Timeframe:  Long-Range Goal Priority:  High Start Date:      06/18/21                       Expected End Date:   09/25/21                    Follow Up Date 09/17/21    -continue with counseling visits(had one today 09/10/21)  - tour [facility] program and seek information on classes, etc -consider local Y's as well for exercise/activities -review the resource materials received- consider getting involved with these!    Why is this important?   Beating depression may take some time.  If you don't feel better right away, don't give up on your treatment plan.    Notes:          If you are experiencing a Mental Health or [place] or need someone to talk to, please call the Suicide and Crisis Lifeline: 988 call the [place] National Suicide Prevention Lifeline: 425-272-7951 or TTY: 860 820 9604 TTY 780-736-8260) to talk to a trained counselor call 911   The patient verbalized understanding of instructions, educational materials, and care plan provided today and DECLINED offer to receive copy of patient instructions, educational materials, and care plan.   No further follow up required:     Eduard Clos MSW, LCSW Licensed Clinical Social Worker      [phone]

## 2021-11-27 NOTE — Patient Outreach (Signed)
  Care Coordination   Follow Up Visit Note   11/27/2021 Name: [name] MRN: [id number] DOB: 05-13-1945  [name] is a 76 y.o. year old female who sees Pickard, [name], MD for primary care. I spoke with  [name] by phone today.  What matters to the patients health and wellness today?  Pt reports she is doing better- counseling, health-wise. She is still planning to pursue [facility] for possible membership/exercise. Pt acknowledges her ongoing grief and loss of her son 5 years ago; mild depression symptoms.  CSW plans to sign off- reminded pt PCP can re-consult [place] if further needs arise.    Goals Addressed               This Visit's Progress     COMPLETED: Begin and Stick with Counseling-Depression (pt-stated)        Timeframe:  Long-Range Goal Priority:  High Start Date:      06/18/21                       Expected End Date:   09/25/21                    Follow Up Date 09/17/21    -continue with counseling visits(had one today 09/10/21)  - tour [facility] program and seek information on classes, etc -consider local Y's as well for exercise/activities -review the resource materials received- consider getting involved with these!    Why is this important?   Beating depression may take some time.  If you don't feel better right away, don't give up on your treatment plan.    Notes:         SDOH assessments and interventions completed:  Yes     Care Coordination Interventions Activated:  Yes  Care Coordination Interventions:  Yes, provided   Follow up plan: No further intervention required.   Encounter Outcome:  Pt. Visit Completed

## 2021-12-02 ENCOUNTER — Ambulatory Visit: Payer: Medicare HMO | Admitting: Psychologist

## 2021-12-02 DIAGNOSIS — M5416 Radiculopathy, lumbar region: Secondary | ICD-10-CM | POA: Diagnosis not present

## 2021-12-03 ENCOUNTER — Telehealth: Payer: Self-pay | Admitting: Internal Medicine

## 2021-12-03 NOTE — Telephone Encounter (Signed)
Pt c/o medication issue:  1. Name of Medication: empagliflozin (JARDIANCE) 10 MG TABS tablet   2. How are you currently taking this medication (dosage and times per day)? Take 1 tablet (10 mg total) by mouth daily before breakfast   3. Are you having a reaction (difficulty breathing--STAT)? no  4. What is your medication issue? Calling to see if she can get any samples, because she is unable to afford it. Please advise

## 2021-12-03 NOTE — Telephone Encounter (Signed)
Left a message to call back.

## 2021-12-04 NOTE — Telephone Encounter (Signed)
Spoke with pt who reports Jardiance cost increased from $47 a month to over $100.  Gave pt phone number for Jardiance pt assistance.  Advised pt to call today to get the process started.  Pt only has 1-2 pills left.  Will also send message to [facility], LPN to see if there is any assistance she can offer.

## 2021-12-04 NOTE — Telephone Encounter (Signed)
**Note De-Identified [name] Obfuscation** I called to f/u with the pt concerning assistance for Jardiance trough [employer]. She had no questions and is aware to call [employer] to ask questions about their Jardiance program and her eligibility to be approved for the program. I did advise her that if she is approved she will receive her Vania Rea free of charge from them for the rest of this year.  I advised her that if advised that she is eligible for approval to request that they mail her an application to her home address and that once she receives it to complete her part of it, obtain required documents per [facility], and to bring all to [name]'s office at [facility] to drop off and that we will take care of the providers page and will fax all to [facility].  She verbalized understanding to all instructions given and thanked me for calling her.

## 2021-12-11 DIAGNOSIS — J309 Allergic rhinitis, unspecified: Secondary | ICD-10-CM | POA: Diagnosis not present

## 2021-12-11 DIAGNOSIS — H903 Sensorineural hearing loss, bilateral: Secondary | ICD-10-CM | POA: Diagnosis not present

## 2021-12-24 ENCOUNTER — Ambulatory Visit (INDEPENDENT_AMBULATORY_CARE_PROVIDER_SITE_OTHER): Payer: Medicare HMO | Admitting: Family Medicine

## 2021-12-24 VITALS — BP 124/80 | HR 71 | Ht 63.0 in | Wt 252.0 lb

## 2021-12-24 DIAGNOSIS — I5032 Chronic diastolic (congestive) heart failure: Secondary | ICD-10-CM | POA: Diagnosis not present

## 2021-12-24 DIAGNOSIS — N1832 Chronic kidney disease, stage 3b: Secondary | ICD-10-CM | POA: Diagnosis not present

## 2021-12-24 NOTE — Progress Notes (Signed)
Subjective:    Patient ID: [name], female    DOB: [birth year], 76 y.o.   MRN: [id number]  HPI 07/26/21 Patient is here today with swelling in her legs.  She has positive edema distal to the knees.  Her feet are swollen and puffy.  She denies any chest pain or shortness of breath.  She does have a history of congestive heart failure with preserved ejection fraction.  Cardiology recently started her on Jardiance to help address this.  She denies any orthopnea or paroxysmal nocturnal dyspnea.  At that time, my plan was: Patient appears fluid overloaded.  Begin Lasix 40 mg twice daily.  Add K dur 20 mill equivalents once daily, continue Jardiance, recheck swelling in 1 week or sooner if worsening.  12/24/21 Patient has CHF with preserved EF appropriately treated with Jardiance.  Diuretics have been maintained although Cr recently rose to 1.44 in September.  Due to recheck today.  Currently on lasix 60 bid and potassium 40 meq poqday but not spironolactone.    Past Medical History:  Diagnosis Date   Allergy    Arthritis    arthritis -knees   Colon polyps    benign- last colonoscopy 09-2008   Depression    GERD (gastroesophageal reflux disease)    hx GERD   H/O seasonal allergies    no problems now   Hyperlipidemia    Hypertension    Murmur, heart    Spinal stenosis of lumbar region at multiple levels    Past Surgical History:  Procedure Laterality Date   ABDOMINAL HYSTERECTOMY     APPENDECTOMY     with gallbladder surgery   BUNIONECTOMY Bilateral    CHOLECYSTECTOMY     open-gallstones   COLONOSCOPY     EXCISION OF ADNEXAL MASS Left August 03, 2013   at [facility]/right adnexal nodule,excision;benigh lymph node   OOPHORECTOMY Bilateral 2008   ovarian cyst   POLYPECTOMY     Current Outpatient Medications on File Prior to Visit  Medication Sig Dispense Refill   acetaminophen (TYLENOL) 650 MG CR tablet Take by mouth as needed for pain.     albuterol (VENTOLIN HFA) 108 (90  Base) MCG/ACT inhaler INHALE 2 PUFFS BY MOUTH EVERY 4 HOURS AS NEEDED FOR WHEEZING FOR SHORTNESS OF BREATH 18 g 0   diltiazem (CARDIZEM CD) 360 MG 24 hr capsule Take 1 capsule by mouth once daily 30 capsule 0   doxazosin (CARDURA) 2 MG tablet Take 1 tablet by mouth once daily 90 tablet 0   empagliflozin (JARDIANCE) 10 MG TABS tablet Take 1 tablet (10 mg total) by mouth daily before breakfast. 30 tablet 11   fluticasone (FLONASE) 50 MCG/ACT nasal spray Place 2 sprays into both nostrils daily. 16 g 6   furosemide (LASIX) 40 MG tablet Take 1.5 tablets (60 mg total) by mouth 2 (two) times daily. 270 tablet 3   lisinopril (ZESTRIL) 40 MG tablet Take 1 tablet (40 mg total) by mouth daily. Please keep upcoming appointment on 12/1 for future refills, thank you. 45 tablet 0   loratadine (CLARITIN) 10 MG tablet Take 1 tablet by mouth daily.     Multiple Vitamins-Minerals (CENTRUM MINIS WOMEN 50+) TABS Take by mouth daily at 6 (six) AM.     naproxen (NAPROSYN) 500 MG tablet TAKE 1 TABLET BY MOUTH TWICE DAILY AS NEEDED FOR MILD PAIN 180 tablet 0   omeprazole (PRILOSEC) 40 MG capsule Take 1 capsule by mouth once daily (Patient taking differently: as needed (  heartburn).) 90 capsule 3   Polyethylene Glycol 3350 (MIRALAX PO) Take by mouth as needed.     potassium chloride SA (KLOR-CON M) 20 MEQ tablet Take 2 tablets (40 mEq total) by mouth daily. 2 TABS IN THE AM 1 TAB IN THE PM 270 tablet 3   rosuvastatin (CRESTOR) 10 MG tablet Take 1 tablet by mouth once daily 90 tablet 0   No current facility-administered medications on file prior to visit.   Allergies  Allergen Reactions   Aldactone [Spironolactone] Rash   Social History   Socioeconomic History   Marital status: Married    Spouse name: Not on file   Number of children: Not on file   Years of education: Not on file   Highest education level: Not on file  Occupational History   Not on file  Tobacco Use   Smoking status: Never   Smokeless tobacco:  Never  Vaping Use   Vaping Use: Never used  Substance and Sexual Activity   Alcohol use: No   Drug use: No   Sexual activity: Yes  Other Topics Concern   Not on file  Social History Narrative   Not on file   Social Determinants of Health   Financial Resource Strain: Low Risk  (06/13/2021)   Overall Financial Resource Strain (CARDIA)    Difficulty of Paying Living Expenses: Not hard at all  Food Insecurity: No Food Insecurity (06/19/2021)   Hunger Vital Sign    Worried About Running Out of Food in the Last Year: Never true    [place] in the Last Year: Never true  Transportation Needs: No Transportation Needs (06/13/2021)   PRAPARE - Hydrologist (Medical): No    Lack of Transportation (Non-Medical): No  Physical Activity: Insufficiently Active (06/13/2021)   Exercise Vital Sign    Days of Exercise per Week: 3 days    Minutes of Exercise per Session: 30 min  Stress: Stress Concern Present (06/13/2021)   [place]    Feeling of Stress : To some extent  Social Connections: Socially Integrated (06/13/2021)   Social Connection and Isolation Panel [NHANES]    Frequency of Communication with Friends and Family: More than three times a week    Frequency of Social Gatherings with Friends and Family: Twice a week    Attends Religious Services: More than 4 times per year    Active Member of [employer] or Organizations: Yes    Attends Archivist Meetings: More than 4 times per year    Marital Status: Married  Human resources officer Violence: Not At Risk (06/13/2021)   Humiliation, Afraid, Rape, and Kick questionnaire    Fear of Current or Ex-Partner: No    Emotionally Abused: No    Physically Abused: No    Sexually Abused: No      Review of Systems  All other systems reviewed and are negative.      Objective:   Physical Exam Vitals reviewed.  Constitutional:      Appearance:  Normal appearance.  Cardiovascular:     Rate and Rhythm: Normal rate and regular rhythm.     Pulses: Normal pulses.     Heart sounds: Murmur heard.  Pulmonary:     Effort: Pulmonary effort is normal. No respiratory distress.     Breath sounds: Normal breath sounds. No stridor. No wheezing, rhonchi or rales.  Chest:     Chest wall: No tenderness.  Musculoskeletal:     Right lower leg: No edema.     Left lower leg: No edema.  Neurological:     Mental Status: She is alert.         Assessment & Plan:  Chronic heart failure with preserved ejection fraction ([place]) - Plan: Cardio IQ NT ProBNP  Stage 3b chronic kidney disease ([place]) I will check bmp and bnp today.  If bnp elevated and renal fcn can tolerate, I will add spironolactone 12.5 mg poqday and reduce Kdur according to K results.  Plan to recheck bmp in 2 weeks. Patient appears euvolemic.

## 2021-12-26 DIAGNOSIS — I10 Essential (primary) hypertension: Secondary | ICD-10-CM | POA: Diagnosis not present

## 2021-12-26 DIAGNOSIS — Z01419 Encounter for gynecological examination (general) (routine) without abnormal findings: Secondary | ICD-10-CM | POA: Diagnosis not present

## 2021-12-26 DIAGNOSIS — Z90722 Acquired absence of ovaries, bilateral: Secondary | ICD-10-CM | POA: Diagnosis not present

## 2021-12-26 DIAGNOSIS — R1031 Right lower quadrant pain: Secondary | ICD-10-CM | POA: Diagnosis not present

## 2021-12-26 DIAGNOSIS — Z9079 Acquired absence of other genital organ(s): Secondary | ICD-10-CM | POA: Diagnosis not present

## 2021-12-26 DIAGNOSIS — Z90710 Acquired absence of both cervix and uterus: Secondary | ICD-10-CM | POA: Diagnosis not present

## 2021-12-26 NOTE — Progress Notes (Signed)
Cardiology Office Note:    Date:  12/27/2021   ID:  [name], DOB 1946/01/21, MRN [id number]  PCP:  [name], MD  [facility] Cardiologist:  Werner Lean, MD  [place] Electrophysiologist:  None   CC: Follow up HFpEF  History of Present Illness:    [name] is a 76 y.o. female with a hx of Morbid Obesity,  HTN, HLD presened for evaluation 12/27/19.   2022: Had normal CMR (no HCM).  Stopped aldactone because of rash- see 03/23/20 note.  Lasix increased. Patient had hesitancy to start rosuvastatin 10 given husbands problems with atorvastatin in the past; did well. 2023: Started SGLT2i, her lasix had been dropped to one a day with worsening leg swelling.  Started [place] but cost was notable.  Had AKI and did not further increase diuretics.  Echo was stable.  Patient notes that she is doing fine.   Since last visit notes that this is the best her legs and breathin have been. There are no interval hospital/ED visit.    No chest pain or pressure.  No SOB/DOE and no PND/Orthopnea.  Weight is about the same with flucations  LE swelling has improved..  She rarely uses inhaler for her obstructive luig disease.  No palpitations or syncope .    Past Medical History:  Diagnosis Date   Allergy    Arthritis    arthritis -knees   Colon polyps    benign- last colonoscopy 09-2008   Depression    GERD (gastroesophageal reflux disease)    hx GERD   H/O seasonal allergies    no problems now   Hyperlipidemia    Hypertension    Murmur, heart    Spinal stenosis of lumbar region at multiple levels     Past Surgical History:  Procedure Laterality Date   ABDOMINAL HYSTERECTOMY     APPENDECTOMY     with gallbladder surgery   BUNIONECTOMY Bilateral    CHOLECYSTECTOMY     open-gallstones   COLONOSCOPY     EXCISION OF ADNEXAL MASS Left August 03, 2013   at [facility]/right adnexal nodule,excision;benigh lymph node   OOPHORECTOMY Bilateral 2008   ovarian  cyst   POLYPECTOMY      Current Medications: Current Meds  Medication Sig   acetaminophen (TYLENOL) 650 MG CR tablet Take by mouth as needed for pain.   albuterol (VENTOLIN HFA) 108 (90 Base) MCG/ACT inhaler INHALE 2 PUFFS BY MOUTH EVERY 4 HOURS AS NEEDED FOR WHEEZING FOR SHORTNESS OF BREATH   diltiazem (CARDIZEM CD) 360 MG 24 hr capsule Take 1 capsule by mouth once daily   doxazosin (CARDURA) 2 MG tablet Take 1 tablet by mouth once daily   empagliflozin (JARDIANCE) 10 MG TABS tablet Take 1 tablet (10 mg total) by mouth daily before breakfast.   fluticasone (FLONASE) 50 MCG/ACT nasal spray Place 2 sprays into both nostrils daily. (Patient taking differently: Place 2 sprays into both nostrils as needed for allergies.)   furosemide (LASIX) 40 MG tablet Take 1.5 tablets (60 mg total) by mouth 2 (two) times daily.   lisinopril (ZESTRIL) 40 MG tablet Take 1 tablet (40 mg total) by mouth daily. Please keep upcoming appointment on 12/1 for future refills, thank you.   loratadine (CLARITIN) 10 MG tablet Take 1 tablet by mouth daily.   Multiple Vitamins-Minerals (CENTRUM MINIS WOMEN 50+) TABS Take by mouth daily at 6 (six) AM.   omeprazole (PRILOSEC) 40 MG capsule Take 1 capsule by  mouth once daily (Patient taking differently: as needed (heartburn).)   Polyethylene Glycol 3350 (MIRALAX PO) Take by mouth as needed.   potassium chloride SA (KLOR-CON M) 20 MEQ tablet Take 2 tablets (40 mEq total) by mouth daily. 2 TABS IN THE AM 1 TAB IN THE PM   rosuvastatin (CRESTOR) 10 MG tablet Take 1 tablet by mouth once daily     Allergies:   Aldactone [spironolactone]   Social History   Socioeconomic History   Marital status: Married    Spouse name: Not on file   Number of children: Not on file   Years of education: Not on file   Highest education level: Not on file  Occupational History   Not on file  Tobacco Use   Smoking status: Never   Smokeless tobacco: Never  Vaping Use   Vaping Use: Never  used  Substance and Sexual Activity   Alcohol use: No   Drug use: No   Sexual activity: Yes  Other Topics Concern   Not on file  Social History Narrative   Not on file   Social Determinants of Health   Financial Resource Strain: Low Risk  (06/13/2021)   Overall Financial Resource Strain (CARDIA)    Difficulty of Paying Living Expenses: Not hard at all  Food Insecurity: No Food Insecurity (06/19/2021)   Hunger Vital Sign    Worried About Running Out of Food in the Last Year: Never true    Ran Out of Food in the Last Year: Never true  Transportation Needs: No Transportation Needs (06/13/2021)   PRAPARE - Hydrologist (Medical): No    Lack of Transportation (Non-Medical): No  Physical Activity: Insufficiently Active (06/13/2021)   Exercise Vital Sign    Days of Exercise per Week: 3 days    Minutes of Exercise per Session: 30 min  Stress: Stress Concern Present (06/13/2021)   [place]    Feeling of Stress : To some extent  Social Connections: Socially Integrated (06/13/2021)   Social Connection and Isolation Panel [NHANES]    Frequency of Communication with Friends and Family: More than three times a week    Frequency of Social Gatherings with Friends and Family: Twice a week    Attends Religious Services: More than 4 times per year    Active Member of [employer] or Organizations: Yes    Attends Music therapist: More than 4 times per year    Marital Status: Married    Social: Talked about hiding an egg for teething -kids at our second visit.  We usually tell stories about kids.  Family History: The patient's family history includes Breast cancer in her paternal aunt; Colon cancer (age of onset: 55) in her mother; Colon cancer (age of onset: 26) in her brother; Colon polyps in her brother; Heart attack in her father. There is no history of Esophageal cancer, Pancreatic cancer,  Rectal cancer, or Stomach cancer. Father had MI and died at 20- no heart catheterization Son had MI and died at age 42- no heart catheterization Brother had CHF. No family history of hypertrophic cardiomyopathy. Brother- died in 29s.  ROS:   Please see the history of present illness.    All other systems reviewed and are negative.  EKGs/Labs/Other Studies Reviewed:    The following studies were reviewed today:  EKG:   03/11/21: SR rate 71 LVH 12/26/20 sinus rhythm rate 79 no pathologic q waves  Cardiac Studies & Procedures       ECHOCARDIOGRAM  ECHOCARDIOGRAM COMPLETE 10/10/2021  Narrative ECHOCARDIOGRAM REPORT    Patient Name:   CELENA LANIUS Date of Exam: 10/10/2021 Medical Rec #:  [id number]     Height:       63.0 in Accession #:    [id number]    Weight:       257.0 lb Date of Birth:  1945/03/28     BSA:          2.151 m Patient Age:    89 years      BP:           120/58 mmHg Patient Gender: F             HR:           77 bpm. Exam Location:  [address]  Procedure: 2D Echo, 3D Echo, Cardiac Doppler, Color Doppler and Strain Analysis  Indications:    I50.9 CHF  History:        Patient has prior history of Echocardiogram examinations, most recent 12/07/2019. Signs/Symptoms:Murmur; Risk Factors:Hypertension and Dyslipidemia.  Sonographer:    Cresenciano Lick RDCS Referring Phys: [id number] [facility] A Gilmer Kaminsky  IMPRESSIONS   1. Mild concentric LVH with severe hypertrophy of basal septum. 2. Left ventricular ejection fraction, by estimation, is 60 to 65%. The left ventricle has normal function. The left ventricle has no regional wall motion abnormalities. There is severe left ventricular hypertrophy of the basal-septal segment. Left ventricular diastolic parameters are consistent with Grade I diastolic dysfunction (impaired relaxation). The average left ventricular global longitudinal strain is -25.5 %. The global longitudinal strain is normal. 3. Right  ventricular systolic function is normal. The right ventricular size is normal. 4. The mitral valve is normal in structure. Trivial mitral valve regurgitation. No evidence of mitral stenosis. 5. The aortic valve is tricuspid. Aortic valve regurgitation is trivial. Aortic valve sclerosis is present, with no evidence of aortic valve stenosis. 6. The inferior vena cava is normal in size with greater than 50% respiratory variability, suggesting right atrial pressure of 3 mmHg.  FINDINGS Left Ventricle: Left ventricular ejection fraction, by estimation, is 60 to 65%. The left ventricle has normal function. The left ventricle has no regional wall motion abnormalities. The average left ventricular global longitudinal strain is -25.5 %. The global longitudinal strain is normal. The left ventricular internal cavity size was normal in size. There is severe left ventricular hypertrophy of the basal-septal segment. Left ventricular diastolic parameters are consistent with Grade I diastolic dysfunction (impaired relaxation). Indeterminate filling pressures.  Right Ventricle: The right ventricular size is normal. Right ventricular systolic function is normal.  Left Atrium: Left atrial size was normal in size.  Right Atrium: Right atrial size was normal in size.  Pericardium: Trivial pericardial effusion is present.  Mitral Valve: The mitral valve is normal in structure. Trivial mitral valve regurgitation. No evidence of mitral valve stenosis.  Tricuspid Valve: The tricuspid valve is normal in structure. Tricuspid valve regurgitation is trivial. No evidence of tricuspid stenosis.  Aortic Valve: The aortic valve is tricuspid. Aortic valve regurgitation is trivial. Aortic regurgitation PHT measures 572 msec. Aortic valve sclerosis is present, with no evidence of aortic valve stenosis.  Pulmonic Valve: The pulmonic valve was normal in structure. Pulmonic valve regurgitation is not visualized. No evidence of  pulmonic stenosis.  Aorta: The aortic root is normal in size and structure.  Venous: The inferior vena cava is normal in size with greater  than 50% respiratory variability, suggesting right atrial pressure of 3 mmHg.  IAS/Shunts: No atrial level shunt detected by color flow Doppler.  Additional Comments: Mild concentric LVH with severe hypertrophy of basal septum.   LEFT VENTRICLE PLAX 2D LVIDd:         4.70 cm   Diastology LVIDs:         3.00 cm   LV e' medial:    5.87 cm/s LV PW:         1.00 cm   LV E/e' medial:  11.6 LV IVS:        1.00 cm   LV e' lateral:   9.14 cm/s LVOT diam:     2.10 cm   LV E/e' lateral: 7.5 LV SV:         72 LV SV Index:   33        2D Longitudinal Strain LVOT Area:     3.46 cm  2D Strain GLS (A2C):   -25.2 % 2D Strain GLS (A3C):   -25.6 % 2D Strain GLS (A4C):   -25.7 % 2D Strain GLS Avg:     -25.5 %  3D Volume EF: 3D EF:        56 % LV EDV:       136 ml LV ESV:       60 ml LV SV:        76 ml  RIGHT VENTRICLE             IVC RV Basal diam:  4.00 cm     IVC diam: 2.10 cm RV S prime:     14.45 cm/s TAPSE (M-mode): 2.3 cm  LEFT ATRIUM             Index        RIGHT ATRIUM           Index LA diam:        5.00 cm 2.32 cm/m   RA Area:     11.90 cm LA Vol (A2C):   42.5 ml 19.76 ml/m  RA Volume:   27.60 ml  12.83 ml/m LA Vol (A4C):   43.6 ml 20.27 ml/m LA Biplane Vol: 43.3 ml 20.13 ml/m AORTIC VALVE LVOT Vmax:   98.80 cm/s LVOT Vmean:  66.767 cm/s LVOT VTI:    0.208 m AI PHT:      572 msec  AORTA Ao Root diam: 3.70 cm Ao Asc diam:  3.60 cm  MITRAL VALVE               TRICUSPID VALVE MV Area (PHT): 3.72 cm    TR Peak grad:   27.0 mmHg MV Decel Time: 204 msec    TR Vmax:        260.00 cm/s MV E velocity: 68.10 cm/s MV A velocity: 91.70 cm/s  SHUNTS MV E/A ratio:  0.74        Systemic VTI:  0.21 m Systemic Diam: 2.10 cm  Kirk Ruths MD Electronically signed by Kirk Ruths MD Signature Date/Time: 10/10/2021/2:37:56  PM    Final      CARDIAC MRI  MR CARDIAC MORPHOLOGY W WO CONTRAST 03/22/2020  Narrative CLINICAL DATA:  76 year old female with h/o HFpEF with notable septal hypertrophy.  EXAM: CARDIAC MRI  TECHNIQUE: The patient was scanned on a 1.5 Tesla GE magnet. A dedicated cardiac coil was used. Functional imaging was done using Fiesta sequences. 2,3, and 4 chamber views were done to assess for RWMA's. Modified Simpson's rule  using a short axis stack was used to calculate an ejection fraction on a dedicated work Conservation officer, nature. The patient received 9 cc of Gadavist. After 10 minutes inversion recovery sequences were used to assess for infiltration and scar tissue.  CONTRAST:  9 cc  of Gadavist  FINDINGS: 1. Normal left ventricular size, with asymmetric basal septal hypertrophy and normal systolic function (LVEF = 62%). There are no regional wall motion abnormalities and no late gadolinium enhancement in the left ventricular myocardium. ECV is 28%.  LVEDD: 50 mm  LVESD: 33 mm  LVEDV: 150 ml  LVESV: 56 ml  SV: 93 ml  CO: 5.96 L/min  Myocardial mass: 83 g  2. Normal right ventricular size, thickness and systolic function (RVEF = 62%). There are no regional wall motion abnormalities.  RVEDV: 139 ml  RVESV: 53 ml  SV: 86 ml  3.  Mildly dilated left atrium, normal right atrial size.  4. Normal size of the aortic root, ascending aorta and pulmonary artery.  5.  No significant valvular abnormalities.  6.  Normal pericardium.  Trivial pericardial effusion.  IMPRESSION: 1. Normal left ventricular size, with asymmetric basal septal hypertrophy and normal systolic function (LVEF = 62%). There are no regional wall motion abnormalities and no late gadolinium enhancement in the left ventricular myocardium. ECV is 28%. The maximum thickness of the basal septum is 14 mm, there is no LGE, no LVOT gradient or SAM.  These findings are not supportive of  hypertrophic cardiomyopathy.  2. Normal right ventricular size, thickness and systolic function (RVEF = 62%). There are no regional wall motion abnormalities.  3. Mildly dilated left atrium, normal right atrial size.  4. Normal size of the aortic root, ascending aorta and pulmonary artery.  5.  No significant valvular abnormalities.  6.  Normal pericardium.  Trivial pericardial effusion.   Electronically Signed By: Ena Dawley On: 03/22/2020 15:04          Recent Labs: 12/31/2020: ALT 14; Hemoglobin 12.8; Platelets 209.0 10/02/2021: BUN 15; Creatinine, Ser 1.44; NT-Pro BNP 81; Potassium 4.1; Sodium 144  Recent Lipid Panel    Component Value Date/Time   CHOL 127 08/31/2020 0818   CHOL 128 07/12/2020 0753   TRIG 63 08/31/2020 0818   HDL 48 (L) 08/31/2020 0818   HDL 45 07/12/2020 0753   CHOLHDL 2.6 08/31/2020 0818   VLDL 20 10/29/2015 0902   LDLCALC 65 08/31/2020 0818    Physical Exam:    VS:  BP 132/70   Pulse 74   Ht '5\' 4"'$  (1.626 m)   Wt 256 lb (116.1 kg)   SpO2 97%   BMI 43.94 kg/m     Wt Readings from Last 3 Encounters:  12/27/21 256 lb (116.1 kg)  12/24/21 252 lb (114.3 kg)  09/25/21 257 lb (116.6 kg)    GEN: Obese female, well developed in no acute distress HEENT: Normal LYMPHATICS: No lymphadenopathy CARDIAC: RRR, II holosystolic murmur  no rubs, gallops  RESPIRATORY:  Clear to auscultation without rales, wheezing or rhonchi  ABDOMEN: Soft, non-tender, non-distended MUSCULOSKELETAL:  no edema ; No deformity  SKIN: Warm and dry NEUROLOGIC:  Alert and oriented x 3 PSYCHIATRIC:  Normal affect   ASSESSMENT:    1. Chronic heart failure with preserved ejection fraction ([place])   2. Essential hypertension   3. Morbid obesity ([place])   4. Mixed hyperlipidemia     PLAN:    Heart Failure Preserved Ejection Fraction  Morbid Obesity Essential HTN -  NYHA class II Stage C, hypervolemic , etiology from HTN and Obesity - Diuretic regimen: lasix 60 mg  daily; getting labs from PCP - allergy to aldactone (itching), will defer eplerenone at this time - continue SGLT2i; she is in the donut hole; she has patient assist paperwork and we will see if she is eligible for a grant  Hyperlipidemia  -LDL goal less than 100  - continue rosuvastatin 10 mg   One year with me  Medication Adjustments/Labs and Tests Ordered: Current medicines are reviewed at length with the patient today.  Concerns regarding medicines are outlined above.  No orders of the defined types were placed in this encounter.   No orders of the defined types were placed in this encounter.    Patient Instructions  Medication Instructions:  Your physician recommends that you continue on your current medications as directed. Please refer to the Current Medication list given to you today.  *If you need a refill on your cardiac medications before your next appointment, please call your pharmacy*   Lab Work: NONE  If you have labs (blood work) drawn today and your tests are completely normal, you will receive your results only by: [place] (if you have MyChart) OR A paper copy in the mail If you have any lab test that is abnormal or we need to change your treatment, we will call you to review the results.   Testing/Procedures: NONE   Follow-Up: At [facility], you and your health needs are our priority.  As part of our continuing mission to provide you with exceptional heart care, we have created designated Provider Care Teams.  These Care Teams include your primary Cardiologist (physician) and Advanced Practice Providers (APPs -  Physician Assistants and Nurse Practitioners) who all work together to provide you with the care you need, when you need it.   Your next appointment:   1 year(s)  The format for your next appointment:   In Person  Provider:   Werner Lean, MD      Important Information About Sugar          Signed, Werner Lean, MD  12/27/2021 9:57 AM    [place]

## 2021-12-27 ENCOUNTER — Telehealth: Payer: Self-pay | Admitting: Pharmacist

## 2021-12-27 ENCOUNTER — Encounter: Payer: Self-pay | Admitting: Internal Medicine

## 2021-12-27 ENCOUNTER — Ambulatory Visit: Payer: Medicare HMO | Attending: Internal Medicine | Admitting: Internal Medicine

## 2021-12-27 VITALS — BP 132/70 | HR 74 | Ht 64.0 in | Wt 256.0 lb

## 2021-12-27 DIAGNOSIS — I5032 Chronic diastolic (congestive) heart failure: Secondary | ICD-10-CM

## 2021-12-27 DIAGNOSIS — E782 Mixed hyperlipidemia: Secondary | ICD-10-CM | POA: Diagnosis not present

## 2021-12-27 DIAGNOSIS — I10 Essential (primary) hypertension: Secondary | ICD-10-CM | POA: Diagnosis not present

## 2021-12-27 NOTE — Patient Instructions (Signed)
Medication Instructions:  Your physician recommends that you continue on your current medications as directed. Please refer to the Current Medication list given to you today.  *If you need a refill on your cardiac medications before your next appointment, please call your pharmacy*   Lab Work: NONE  If you have labs (blood work) drawn today and your tests are completely normal, you will receive your results only by: [place] (if you have MyChart) OR A paper copy in the mail If you have any lab test that is abnormal or we need to change your treatment, we will call you to review the results.   Testing/Procedures: NONE   Follow-Up: At [facility], you and your health needs are our priority.  As part of our continuing mission to provide you with exceptional heart care, we have created designated Provider Care Teams.  These Care Teams include your primary Cardiologist (physician) and Advanced Practice Providers (APPs -  Physician Assistants and Nurse Practitioners) who all work together to provide you with the care you need, when you need it.   Your next appointment:   1 year(s)  The format for your next appointment:   In Person  Provider:   Werner Lean, MD      Important Information About Sugar

## 2021-12-27 NOTE — Telephone Encounter (Signed)
Made aware by Dr. Dwyane Dee that patient is in coverage gap and cannot afford Jardiance. Called pt and asked if she would like [place] to apply for healthwell grant. Patient agreed. Cardiomyopathy Healthwell grant approved.  Called pt and gave her grant information.  CARD NO. [id number]   CARD STATUS Active   BIN [id number]   PCN [id number]   PC GROUP [id number]

## 2021-12-27 NOTE — Progress Notes (Signed)
Chronic Care Management Pharmacy Assistant   Name: [name]  MRN: [id number] DOB: 04/08/1945   Reason for Encounter: Hypertension Adherence Call    Recent office visits:  12/24/2021 OV (PCP) [name] Frizzle, MD; I will check bmp and bnp today. If bnp elevated and renal fcn can tolerate, I will add spironolactone 12.5 mg poqday and reduce Kdur according to K results.   Recent consult visits:  12/27/2021 OV (Cardiology) [name] Coupe, MD; no medication changes indicated.  12/11/2021 OV (Otolaryngology) [name], [name] Mo, DO; no medication changes indicated.  Hospital visits:  None in previous 6 months  Medications: Outpatient Encounter Medications as of 12/27/2021  Medication Sig   acetaminophen (TYLENOL) 650 MG CR tablet Take by mouth as needed for pain.   albuterol (VENTOLIN HFA) 108 (90 Base) MCG/ACT inhaler INHALE 2 PUFFS BY MOUTH EVERY 4 HOURS AS NEEDED FOR WHEEZING FOR SHORTNESS OF BREATH   diltiazem (CARDIZEM CD) 360 MG 24 hr capsule Take 1 capsule by mouth once daily   doxazosin (CARDURA) 2 MG tablet Take 1 tablet by mouth once daily   empagliflozin (JARDIANCE) 10 MG TABS tablet Take 1 tablet (10 mg total) by mouth daily before breakfast.   fluticasone (FLONASE) 50 MCG/ACT nasal spray Place 2 sprays into both nostrils daily. (Patient taking differently: Place 2 sprays into both nostrils as needed for allergies.)   furosemide (LASIX) 40 MG tablet Take 1.5 tablets (60 mg total) by mouth 2 (two) times daily.   lisinopril (ZESTRIL) 40 MG tablet Take 1 tablet (40 mg total) by mouth daily. Please keep upcoming appointment on 12/1 for future refills, thank you.   loratadine (CLARITIN) 10 MG tablet Take 1 tablet by mouth daily.   Multiple Vitamins-Minerals (CENTRUM MINIS WOMEN 50+) TABS Take by mouth daily at 6 (six) AM.   omeprazole (PRILOSEC) 40 MG capsule Take 1 capsule by mouth once daily (Patient taking differently: as needed (heartburn).)   Polyethylene  Glycol 3350 (MIRALAX PO) Take by mouth as needed.   potassium chloride SA (KLOR-CON M) 20 MEQ tablet Take 2 tablets (40 mEq total) by mouth daily. 2 TABS IN THE AM 1 TAB IN THE PM   rosuvastatin (CRESTOR) 10 MG tablet Take 1 tablet by mouth once daily   No facility-administered encounter medications on file as of 12/27/2021.   Reviewed chart prior to disease state call. Spoke with patient regarding BP  Recent Office Vitals: BP Readings from Last 3 Encounters:  12/27/21 132/70  12/24/21 124/80  09/25/21 (!) 120/58   Pulse Readings from Last 3 Encounters:  12/27/21 74  12/24/21 71  09/25/21 69    Wt Readings from Last 3 Encounters:  12/27/21 256 lb (116.1 kg)  12/24/21 252 lb (114.3 kg)  09/25/21 257 lb (116.6 kg)     Kidney Function Lab Results  Component Value Date/Time   CREATININE 1.44 (H) 10/02/2021 03:35 PM   CREATININE 1.03 (H) 07/26/2021 09:19 AM   CREATININE 1.03 (H) 04/11/2021 09:25 AM   CREATININE 0.99 08/31/2020 08:18 AM   GFR 53.92 (L) 12/31/2020 12:11 PM   GFRNONAA 54 (L) 01/04/2020 03:06 PM   GFRNONAA 68 11/07/2019 12:28 PM   GFRAA 62 01/04/2020 03:06 PM   GFRAA 79 11/07/2019 12:28 PM       Latest Ref Rng & Units 10/02/2021    3:35 PM 07/26/2021    9:19 AM 04/11/2021    9:25 AM  BMP  Glucose 70 - 99 mg/dL 133  118  123  BUN 8 - 27 mg/dL '15  9  12   '$ Creatinine 0.57 - 1.00 mg/dL 1.44  1.03  1.03   BUN/Creat Ratio 12 - '28 10  9  12   '$ Sodium 134 - 144 mmol/L 144  143  145   Potassium 3.5 - 5.2 mmol/L 4.1  3.3  4.0   Chloride 96 - 106 mmol/L 98  102  102   CO2 20 - 29 mmol/L '27  31  28   '$ Calcium 8.7 - 10.3 mg/dL 9.5  9.4  9.5     Current antihypertensive regimen:  Diltiazem 360 mg daily Doxazosin 2 mg daily Furosemide 40 mg 1.5 tablet  twice daily Lisinopril 10 mg daily  How often are you checking your Blood Pressure? daily  Current home BP readings: 132/70  What recent interventions/DTPs have been made by any provider to improve Blood Pressure  control since last CPP Visit: No recent medication changes.  Any recent hospitalizations or ED visits since last visit with CPP? No  What diet changes have been made to improve Blood Pressure Control?  Patient states she doesn't have much of an appetite.  What exercise is being done to improve your Blood Pressure Control?  Patient states she tries to walk but has to take breaks.  Adherence Review: Is the patient currently on ACE/ARB medication? Yes Does the patient have >5 day gap between last estimated fill dates? No  Care Gaps: Medicare Annual Wellness: Completed 06/13/2021 Hemoglobin A1C: none available Colonoscopy: Next due on 12/15/2023 Dexa Scan: Completed Mammogram: Next due on 04/27/2022  Future Appointments  Date Time Provider [place]  05/15/2022 12:15 PM [facility]-CCM PHARMACIST [facility]  06/19/2022  8:15 AM [facility]-NURSE HEALTH ADVISOR [facility]   Star Rating Drugs: Jardiance 10 mg last filled 11/29/2021 30 DS Lisinopril 40 mg last filled 11/23/2021 45 DS Rosuvastatin 10 mg last filled 09/24/2021 90 DS - patient states she plans to refill soon. She denies running out of this medication.  [name], [place] Pharmacist Assistant [phone]

## 2021-12-28 ENCOUNTER — Other Ambulatory Visit: Payer: Self-pay | Admitting: Family Medicine

## 2021-12-28 ENCOUNTER — Other Ambulatory Visit: Payer: Self-pay | Admitting: Internal Medicine

## 2022-01-01 LAB — CARDIO IQ® NT PROBNP: NT PROBNP: 79 pg/mL (ref <125)

## 2022-01-02 ENCOUNTER — Other Ambulatory Visit: Payer: Self-pay

## 2022-01-02 ENCOUNTER — Telehealth: Payer: Self-pay

## 2022-01-02 DIAGNOSIS — I5032 Chronic diastolic (congestive) heart failure: Secondary | ICD-10-CM

## 2022-01-02 DIAGNOSIS — I10 Essential (primary) hypertension: Secondary | ICD-10-CM

## 2022-01-02 MED ORDER — SPIRONOLACTONE 25 MG PO TABS: 12.5000 mg | ORAL_TABLET | Freq: Every day | ORAL | 1 refills | Status: DC

## 2022-01-02 NOTE — Telephone Encounter (Signed)
Pharmacist at [facility] called to advised that there is a drug interaction with lisinopril and spirolactone. Thank you.

## 2022-01-08 ENCOUNTER — Other Ambulatory Visit: Payer: Self-pay | Admitting: Family Medicine

## 2022-01-08 ENCOUNTER — Other Ambulatory Visit: Payer: Self-pay | Admitting: Internal Medicine

## 2022-01-08 DIAGNOSIS — J209 Acute bronchitis, unspecified: Secondary | ICD-10-CM

## 2022-01-08 NOTE — Telephone Encounter (Signed)
Requested Prescriptions  Pending Prescriptions Disp Refills   albuterol (VENTOLIN HFA) 108 (90 Base) MCG/ACT inhaler [Pharmacy Med Name: Albuterol Sulfate HFA 108 (90 Base) MCG/ACT Inhalation Aerosol Solution] 18 g 0    Sig: INHALE 2 PUFFS BY MOUTH EVERY 4 HOURS AS NEEDED FOR WHEEZING FOR SHORTNESS OF BREATH     Pulmonology:  Beta Agonists 2 Passed - 01/08/2022  9:14 AM      Passed - Last BP in normal range    BP Readings from Last 1 Encounters:  12/27/21 132/70         Passed - Last Heart Rate in normal range    Pulse Readings from Last 1 Encounters:  12/27/21 74         Passed - Valid encounter within last 12 months    Recent Outpatient Visits           6 months ago Change in bowel habits   [place] Pickard, [name], MD   9 months ago Ingrown thumb nail, right   [place] Pickard, [name], MD   1 year ago General medical exam   [place] [name], MD   1 year ago Sinus pressure   [place] Eulogio Bear, NP   2 years ago Spinal stenosis of lumbar region at multiple levels   [place] Pickard, [name], MD

## 2022-01-20 ENCOUNTER — Other Ambulatory Visit: Payer: Self-pay | Admitting: Family Medicine

## 2022-02-04 ENCOUNTER — Other Ambulatory Visit: Payer: Self-pay | Admitting: Family Medicine

## 2022-02-05 NOTE — Telephone Encounter (Signed)
Requested medication (s) are due for refill today:yes  Requested medication (s) are on the active medication list: yes    Last refill: 12/30/21  #30  0 refills  Future visit scheduled 05/15/22  with Pharmacist  Notes to clinic:Failed due to labs, please review. Thank you.  Requested Prescriptions  Pending Prescriptions Disp Refills   diltiazem (CARDIZEM CD) 360 MG 24 hr capsule [Pharmacy Med Name: dilTIAZem HCl ER Coated Beads 360 MG Oral Capsule Extended Release 24 Hour] 30 capsule 0    Sig: Take 1 capsule by mouth once daily     Cardiovascular: Calcium Channel Blockers 3 Failed - 02/04/2022  8:11 PM      Failed - ALT in normal range and within 360 days    ALT  Date Value Ref Range Status  12/31/2020 14 0 - 35 U/L Final         Failed - AST in normal range and within 360 days    AST  Date Value Ref Range Status  12/31/2020 17 0 - 37 U/L Final         Failed - Cr in normal range and within 360 days    Creat  Date Value Ref Range Status  07/26/2021 1.03 (H) 0.60 - 1.00 mg/dL Final   Creatinine, Ser  Date Value Ref Range Status  10/02/2021 1.44 (H) 0.57 - 1.00 mg/dL Final         Failed - Valid encounter within last 6 months    Recent Outpatient Visits           7 months ago Change in bowel habits   [place] Pickard, [name], MD   10 months ago Ingrown thumb nail, right   [place] Pickard, [name], MD   1 year ago General medical exam   [place] [name], MD   1 year ago Sinus pressure   [place] Eulogio Bear, NP   2 years ago Spinal stenosis of lumbar region at multiple levels   [place], [name], MD              Passed - Last BP in normal range    BP Readings from Last 1 Encounters:  12/27/21 132/70         Passed - Last Heart Rate in normal range    Pulse Readings from Last 1 Encounters:  12/27/21 74

## 2022-02-18 ENCOUNTER — Other Ambulatory Visit (HOSPITAL_COMMUNITY): Payer: Self-pay | Admitting: Orthopedic Surgery

## 2022-02-18 ENCOUNTER — Ambulatory Visit (HOSPITAL_COMMUNITY)
Admission: RE | Admit: 2022-02-18 | Discharge: 2022-02-18 | Disposition: A | Discharge status: Home / Self Care | Payer: Medicare HMO | Source: Ambulatory Visit | Attending: Vascular Surgery | Admitting: Vascular Surgery

## 2022-02-18 DIAGNOSIS — M79605 Pain in left leg: Secondary | ICD-10-CM | POA: Insufficient documentation

## 2022-02-18 DIAGNOSIS — M7989 Other specified soft tissue disorders: Secondary | ICD-10-CM | POA: Insufficient documentation

## 2022-02-18 DIAGNOSIS — M25562 Pain in left knee: Secondary | ICD-10-CM | POA: Diagnosis not present

## 2022-02-28 ENCOUNTER — Ambulatory Visit (INDEPENDENT_AMBULATORY_CARE_PROVIDER_SITE_OTHER): Payer: Medicare HMO | Admitting: Family Medicine

## 2022-02-28 ENCOUNTER — Encounter: Payer: Self-pay | Admitting: Family Medicine

## 2022-02-28 VITALS — BP 132/72 | HR 77 | Temp 98.5°F | Ht 64.0 in | Wt 253.0 lb

## 2022-02-28 DIAGNOSIS — K668 Other specified disorders of peritoneum: Secondary | ICD-10-CM | POA: Diagnosis not present

## 2022-02-28 DIAGNOSIS — R195 Other fecal abnormalities: Secondary | ICD-10-CM

## 2022-02-28 NOTE — Progress Notes (Signed)
Subjective:    Patient ID: [name], female    DOB: Feb 20, 1945, 77 y.o.   MRN: [id number]  HPI Patient's gynecologist has recommended the patient to see a gastroenterologist.  Patient is here because she wants me to place a referral for this.  In 2022, the patient had a CT scan of the abdomen and pelvis due to right lower quadrant pain.  At that time, a 2.1 cm benign-appearing cyst like structure was seen in the right adnexa.  Etiology minute this was slurred to her previous study and unchanged.  There was no documentation of the cyst on the previous CT scan from 2017 however I reviewed the images myself and it appears to be present at roughly the same size and location.  Therefore this would favor a benign cyst like mass.  However gynecology was concerned because she has had a hysterectomy and bilateral salpingo-oophorectomy and therefore they do not believe that that cyst should be there.  Therefore they wanted consultation with GI to determine the nature of that mass.  Patient is concerned because her mother died from colon cancer.  Patient also reports a change in her stool.  She states that it is runny and loose.  At times she has a difficult time holding back and can have fecal incontinence.  Past Medical History:  Diagnosis Date   Allergy    Arthritis    arthritis -knees   Colon polyps    benign- last colonoscopy 09-2008   Depression    GERD (gastroesophageal reflux disease)    hx GERD   H/O seasonal allergies    no problems now   Hyperlipidemia    Hypertension    Murmur, heart    Spinal stenosis of lumbar region at multiple levels    Past Surgical History:  Procedure Laterality Date   ABDOMINAL HYSTERECTOMY     APPENDECTOMY     with gallbladder surgery   BUNIONECTOMY Bilateral    CHOLECYSTECTOMY     open-gallstones   COLONOSCOPY     EXCISION OF ADNEXAL MASS Left August 03, 2013   at [facility]/right adnexal nodule,excision;benigh lymph node   OOPHORECTOMY Bilateral 2008    ovarian cyst   POLYPECTOMY     Current Outpatient Medications on File Prior to Visit  Medication Sig Dispense Refill   acetaminophen (TYLENOL) 650 MG CR tablet Take by mouth as needed for pain.     albuterol (VENTOLIN HFA) 108 (90 Base) MCG/ACT inhaler INHALE 2 PUFFS BY MOUTH EVERY 4 HOURS AS NEEDED FOR WHEEZING FOR SHORTNESS OF BREATH 18 g 0   diltiazem (CARDIZEM CD) 360 MG 24 hr capsule Take 1 capsule by mouth once daily 30 capsule 0   doxazosin (CARDURA) 2 MG tablet Take 1 tablet by mouth once daily 90 tablet 0   empagliflozin (JARDIANCE) 10 MG TABS tablet Take 1 tablet (10 mg total) by mouth daily before breakfast. 30 tablet 11   fluticasone (FLONASE) 50 MCG/ACT nasal spray Place 2 sprays into both nostrils daily. (Patient taking differently: Place 2 sprays into both nostrils as needed for allergies.) 16 g 6   furosemide (LASIX) 40 MG tablet Take 1.5 tablets (60 mg total) by mouth 2 (two) times daily. 270 tablet 3   lisinopril (ZESTRIL) 40 MG tablet Take 1 tablet (40 mg total) by mouth daily. 90 tablet 3   loratadine (CLARITIN) 10 MG tablet Take 1 tablet by mouth daily.     Multiple Vitamins-Minerals (CENTRUM MINIS WOMEN 50+) TABS Take by  mouth daily at 6 (six) AM.     omeprazole (PRILOSEC) 40 MG capsule Take 1 capsule by mouth once daily (Patient taking differently: as needed (heartburn).) 90 capsule 3   Polyethylene Glycol 3350 (MIRALAX PO) Take by mouth as needed.     rosuvastatin (CRESTOR) 10 MG tablet Take 1 tablet by mouth once daily 90 tablet 3   spironolactone (ALDACTONE) 25 MG tablet Take 0.5 tablets (12.5 mg total) by mouth daily. 30 tablet 1   No current facility-administered medications on file prior to visit.   Allergies  Allergen Reactions   Aldactone [Spironolactone] Rash   Social History   Socioeconomic History   Marital status: Married    Spouse name: Not on file   Number of children: Not on file   Years of education: Not on file   Highest education level: Not  on file  Occupational History   Not on file  Tobacco Use   Smoking status: Never   Smokeless tobacco: Never  Vaping Use   Vaping Use: Never used  Substance and Sexual Activity   Alcohol use: No   Drug use: No   Sexual activity: Yes  Other Topics Concern   Not on file  Social History Narrative   Not on file   Social Determinants of Health   Financial Resource Strain: Low Risk  (06/13/2021)   Overall Financial Resource Strain (CARDIA)    Difficulty of Paying Living Expenses: Not hard at all  Food Insecurity: No Food Insecurity (06/19/2021)   Hunger Vital Sign    Worried About Running Out of Food in the Last Year: Never true    [place] in the Last Year: Never true  Transportation Needs: No Transportation Needs (06/13/2021)   PRAPARE - Hydrologist (Medical): No    Lack of Transportation (Non-Medical): No  Physical Activity: Insufficiently Active (06/13/2021)   Exercise Vital Sign    Days of Exercise per Week: 3 days    Minutes of Exercise per Session: 30 min  Stress: Stress Concern Present (06/13/2021)   [place]    Feeling of Stress : To some extent  Social Connections: Socially Integrated (06/13/2021)   Social Connection and Isolation Panel [NHANES]    Frequency of Communication with Friends and Family: More than three times a week    Frequency of Social Gatherings with Friends and Family: Twice a week    Attends Religious Services: More than 4 times per year    Active Member of [employer] or Organizations: Yes    Attends Archivist Meetings: More than 4 times per year    Marital Status: Married  Human resources officer Violence: Not At Risk (06/13/2021)   Humiliation, Afraid, Rape, and Kick questionnaire    Fear of Current or Ex-Partner: No    Emotionally Abused: No    Physically Abused: No    Sexually Abused: No      Review of Systems  All other systems reviewed and  are negative.      Objective:   Physical Exam Vitals reviewed.  Constitutional:      Appearance: Normal appearance.  Cardiovascular:     Rate and Rhythm: Normal rate and regular rhythm.     Pulses: Normal pulses.     Heart sounds: Murmur heard.  Pulmonary:     Effort: Pulmonary effort is normal. No respiratory distress.     Breath sounds: Normal breath sounds. No stridor. No  wheezing, rhonchi or rales.  Chest:     Chest wall: No tenderness.  Abdominal:     General: Abdomen is flat. Bowel sounds are normal. There is no distension.     Palpations: Abdomen is soft.     Tenderness: There is no abdominal tenderness. There is no guarding or rebound.  Musculoskeletal:     Right lower leg: No edema.     Left lower leg: No edema.  Neurological:     Mental Status: She is alert.         Assessment & Plan:  Loose stools - Plan: Ambulatory referral to Gastroenterology  Cystic lesion of abdominal viscera - Plan: Ambulatory referral to Gastroenterology  I will be happy to have her see GI to discuss the loose stool.  I have recommended starting Metamucil daily as a bulking agent to try to reduce the diarrhea and loose stool.  I believe that the cystlike mass seen in the right adnexa on the CT scan from 2022 was also apparent on the CT scan from 2017.  It appears to be unchanged to my untrained read.  Therefore I believe that this is likely a benign appearing mass that is a cyst.  However the patient would like to arrange a second opinion with gastroenterology.  I will be happy to arrange this for her.

## 2022-03-03 ENCOUNTER — Other Ambulatory Visit: Payer: Self-pay | Admitting: Family Medicine

## 2022-03-05 DIAGNOSIS — M5416 Radiculopathy, lumbar region: Secondary | ICD-10-CM | POA: Diagnosis not present

## 2022-03-26 ENCOUNTER — Telehealth: Payer: Self-pay

## 2022-03-26 ENCOUNTER — Other Ambulatory Visit: Payer: Self-pay

## 2022-03-26 ENCOUNTER — Other Ambulatory Visit: Payer: Self-pay | Admitting: Family Medicine

## 2022-03-26 DIAGNOSIS — M1990 Unspecified osteoarthritis, unspecified site: Secondary | ICD-10-CM

## 2022-03-26 DIAGNOSIS — M48061 Spinal stenosis, lumbar region without neurogenic claudication: Secondary | ICD-10-CM

## 2022-03-26 DIAGNOSIS — Z1231 Encounter for screening mammogram for malignant neoplasm of breast: Secondary | ICD-10-CM

## 2022-03-26 DIAGNOSIS — M25559 Pain in unspecified hip: Secondary | ICD-10-CM

## 2022-03-26 NOTE — Telephone Encounter (Signed)
Pt asks if possible to have a walker ordered, due to her spinal stenosis. Pt states she would like to have one to improve her stability when walking. Thank you.

## 2022-04-02 ENCOUNTER — Other Ambulatory Visit: Payer: Self-pay | Admitting: Internal Medicine

## 2022-04-05 ENCOUNTER — Other Ambulatory Visit: Payer: Self-pay | Admitting: Family Medicine

## 2022-04-07 ENCOUNTER — Other Ambulatory Visit: Payer: Self-pay | Admitting: Family Medicine

## 2022-04-09 DIAGNOSIS — M4800 Spinal stenosis, site unspecified: Secondary | ICD-10-CM | POA: Diagnosis not present

## 2022-04-16 ENCOUNTER — Other Ambulatory Visit: Payer: Self-pay

## 2022-04-16 MED ORDER — EMPAGLIFLOZIN 10 MG PO TABS: 10.0000 mg | ORAL_TABLET | Freq: Every day | ORAL | 2 refills | Status: DC

## 2022-04-17 ENCOUNTER — Other Ambulatory Visit: Payer: Self-pay | Admitting: Family Medicine

## 2022-05-01 ENCOUNTER — Other Ambulatory Visit: Payer: Self-pay | Admitting: Family Medicine

## 2022-05-06 NOTE — Progress Notes (Unsigned)
     05/06/2022 BRECKEN MICHALSKI [id number] 03/08/1945   Chief Complaint:  History of Present Illness: [name]. Weltzin is a 77 year old female with a past medical history of  arthritis, spinal stenosis, depression, hypertension, hyperlipidemia, GERD and colon polyps. She is known by Dr. Christella Hartigan. She presents today   PAST GI PROCEDURES:  Colonoscopy 12/05/2018: - One 3 mm polyp in the descending colon, removed with a cold snare. Resected and retrieved. - Diverticulosis in the left colon. - The examination was otherwise normal on direct and retroflexion views. - COLONIC MUCOSA WITH BENIGN LYMPHOID AGGREGATE. - NO ADENOMATOUS CHANGE OR MALIGNANCY. The quality of the bowel preparation was good.   Colonoscopy 12/19/2013: 1. Two sessile polyps ranging between 3-47mm in size were found in the transverse colon; polypectomies were performed with a cold snare 2. Mild diverticulosis was noted in the left colon 3. The examination was otherwise normal - TUBULAR ADENOMA (X 1). - SESSILE SERRATED POLYP WITHOUT CYTOLOGIC DYSPLASIA (X 1). - NO HIGH GRADE DYSPLASIA OR MALIGNANCY IDENTIFIED   Colonoscopy July 2005, Dr. Wandalee Ferdinand, done for screening.  3 subcentimeter polyps were removed, these were adenomatous on biopsy. Also left-sided diverticulosis was noted Colonoscopy pathology report from 2010 showed one hyperplastic polyp and one tubular adenoma.      Current Medications, Allergies, Past Medical History, Past Surgical History, Family History and Social History were reviewed in [employer] record.   Review of Systems:   Constitutional: Negative for fever, sweats, chills or weight loss.  Respiratory: Negative for shortness of breath.   Cardiovascular: Negative for chest pain, palpitations and leg swelling.  Gastrointestinal: See HPI.  Musculoskeletal: Negative for back pain or muscle aches.  Neurological: Negative for dizziness, headaches or paresthesias.    Physical  Exam: There were no vitals taken for this visit. General: in no acute distress. Head: Normocephalic and atraumatic. Eyes: No scleral icterus. Conjunctiva pink . Ears: Normal auditory acuity. Mouth: Dentition intact. No ulcers or lesions.  Lungs: Clear throughout to auscultation. Heart: Regular rate and rhythm, no murmur. Abdomen: Soft, nontender and nondistended. No masses or hepatomegaly. Normal bowel sounds x 4 quadrants.  Rectal: *** Musculoskeletal: Symmetrical with no gross deformities. Extremities: No edema. Neurological: Alert oriented x 4. No focal deficits.  Psychological: Alert and cooperative. Normal mood and affect  Assessment and Recommendations: ***

## 2022-05-07 ENCOUNTER — Ambulatory Visit: Payer: Medicare HMO | Admitting: Nurse Practitioner

## 2022-05-07 ENCOUNTER — Encounter: Payer: Self-pay | Admitting: Nurse Practitioner

## 2022-05-07 ENCOUNTER — Other Ambulatory Visit (INDEPENDENT_AMBULATORY_CARE_PROVIDER_SITE_OTHER): Payer: Medicare HMO

## 2022-05-07 VITALS — BP 128/88 | HR 75 | Ht 64.0 in | Wt 252.2 lb

## 2022-05-07 DIAGNOSIS — R1031 Right lower quadrant pain: Secondary | ICD-10-CM | POA: Diagnosis not present

## 2022-05-07 LAB — COMPREHENSIVE METABOLIC PANEL
ALT: 12 U/L (ref 0–35)
AST: 16 U/L (ref 0–37)
Albumin: 4.6 g/dL (ref 3.5–5.2)
Alkaline Phosphatase: 43 U/L (ref 39–117)
BUN: 12 mg/dL (ref 6–23)
CO2: 33 mEq/L — ABNORMAL HIGH (ref 19–32)
Calcium: 9.9 mg/dL (ref 8.4–10.5)
Chloride: 99 mEq/L (ref 96–112)
Creatinine, Ser: 0.91 mg/dL (ref 0.40–1.20)
GFR: 61.98 mL/min (ref >60.00)
Glucose, Bld: 97 mg/dL (ref 70–99)
Potassium: 3.1 mEq/L — ABNORMAL LOW (ref 3.5–5.1)
Sodium: 141 mEq/L (ref 135–145)
Total Bilirubin: 0.5 mg/dL (ref 0.2–1.2)
Total Protein: 7.4 g/dL (ref 6.0–8.3)

## 2022-05-07 LAB — CBC WITH DIFFERENTIAL/PLATELET
Basophils Absolute: 0.1 10*3/uL (ref 0.0–0.1)
Basophils Relative: 1.1 % (ref 0.0–3.0)
Eosinophils Absolute: 0.2 10*3/uL (ref 0.0–0.7)
Eosinophils Relative: 3.2 % (ref 0.0–5.0)
HCT: 42.6 % (ref 36.0–46.0)
Hemoglobin: 14.1 g/dL (ref 12.0–15.0)
Lymphocytes Relative: 17.9 % (ref 12.0–46.0)
Lymphs Abs: 1.4 10*3/uL (ref 0.7–4.0)
MCHC: 33 g/dL (ref 30.0–36.0)
MCV: 88.3 fl (ref 78.0–100.0)
Monocytes Absolute: 0.6 10*3/uL (ref 0.1–1.0)
Monocytes Relative: 8.1 % (ref 3.0–12.0)
Neutro Abs: 5.3 10*3/uL (ref 1.4–7.7)
Neutrophils Relative %: 69.7 % (ref 43.0–77.0)
Platelets: 225 10*3/uL (ref 150.0–400.0)
RBC: 4.83 Mil/uL (ref 3.87–5.11)
RDW: 14.2 % (ref 11.5–15.5)
WBC: 7.6 10*3/uL (ref 4.0–10.5)

## 2022-05-07 NOTE — Patient Instructions (Signed)
You have been scheduled for a CT scan of the abdomen and pelvis at [facility], 1st floor Radiology. You are scheduled on 05/20/22 at 12 pm. You should arrive 15 minutes prior to your appointment time for registration. Make sure that you have nothing to eat or drink 4 hours prior to your test.  If you have any questions regarding your exam or if you need to reschedule, you may call [facility] at [phone] between the hours of 8:00 am and 5:00 pm, Monday-Friday.   Your provider has requested that you go to the basement level for lab work before leaving today. Press "B" on the elevator. The lab is located at the first door on the left as you exit the elevator.  Due to recent changes in healthcare laws, you may see the results of your imaging and laboratory studies on MyChart before your provider has had a chance to review them.  We understand that in some cases there may be results that are confusing or concerning to you. Not all laboratory results come back in the same time frame and the provider may be waiting for multiple results in order to interpret others.  Please give [place] 48 hours in order for your provider to thoroughly review all the results before contacting the office for clarification of your results.   Thank you for trusting me with your gastrointestinal care!   Alcide Evener, CRNP

## 2022-05-08 ENCOUNTER — Telehealth: Payer: Self-pay

## 2022-05-08 NOTE — Telephone Encounter (Signed)
Pt called in stating that after visiting her other provider, pt stated that her potassium was low. Pt would like to speak with the nurse please. Please advise  Cb#: [phone]

## 2022-05-08 NOTE — Progress Notes (Signed)
I agree with the assessment and plan as outlined by Ms. Kennedy-Smith. 

## 2022-05-09 ENCOUNTER — Ambulatory Visit
Admission: RE | Admit: 2022-05-09 | Discharge: 2022-05-09 | Disposition: A | Discharge status: Home / Self Care | Payer: Medicare HMO | Source: Ambulatory Visit | Attending: Family Medicine | Admitting: Family Medicine

## 2022-05-09 DIAGNOSIS — Z1231 Encounter for screening mammogram for malignant neoplasm of breast: Secondary | ICD-10-CM | POA: Diagnosis not present

## 2022-05-14 ENCOUNTER — Telehealth: Payer: Self-pay | Admitting: Pharmacist

## 2022-05-14 NOTE — Progress Notes (Signed)
Care Management & Coordination Services Pharmacy Team   Reason for Encounter: Appointment Reminder   Contacted patient to confirm telephone appointment with Erskine Emery, PharmD on 05/15/22 at 12:15PM. Unsuccessful outreach. Left voicemail for patient to return call.  Do you have any problems getting your medications?  If yes what types of problems are you experiencing?   What is your top health concern you would like to discuss at your upcoming visit?   Have you seen any other providers since your last visit with PCP?     Future Appointments  Date Time Provider Department Center  05/15/2022  8:30 AM [facility]  05/15/2022 12:15 PM Erroll Luna, RPH [facility] None  05/20/2022 12:00 PM [facility] 1 [facility] Why  05/30/2022 10:30 AM Donita Brooks, MD [facility]  06/19/2022  8:15 AM [facility]-ANNUAL WELLNESS VISIT [facility]    [employer], [employer]

## 2022-05-15 ENCOUNTER — Other Ambulatory Visit: Payer: Medicare HMO

## 2022-05-15 ENCOUNTER — Ambulatory Visit: Payer: Medicare HMO | Admitting: Pharmacist

## 2022-05-15 DIAGNOSIS — E876 Hypokalemia: Secondary | ICD-10-CM | POA: Diagnosis not present

## 2022-05-15 DIAGNOSIS — I10 Essential (primary) hypertension: Secondary | ICD-10-CM

## 2022-05-15 NOTE — Progress Notes (Signed)
Care Management & Coordination Services Pharmacy Note  05/16/2022 Name:  [name] MRN:  [id number] DOB:  1945-03-18  Summary: PharmD FU visit.  Patient doing well but still has constant swelling.  GFR normal.  Could consider Jardiance titration or prn metolazone for excessive swelling.  Good news is that she does not associated shortness of breath with this.  Recommendations/Changes made from today's visit: For now no changes - consider Jardiance  or prn metolazone  Follow up plan: FU 6 months   Subjective: [name] is an 77 y.o. year old female who is a primary patient of Pickard, Priscille Heidelberg, MD.  The care coordination team was consulted for assistance with disease management and care coordination needs.    Engaged with patient by telephone for follow up visit.  Recent office visits:  12/24/2021 OV (PCP) Donita Brooks, MD; I will check bmp and bnp today. If bnp elevated and renal fcn can tolerate, I will add spironolactone 12.5 mg poqday and reduce Kdur according to K results.    Recent consult visits:  12/27/2021 OV (Cardiology) Filbert Schilder, MD; no medication changes indicated.   12/11/2021 OV (Otolaryngology) Skotnicki, Loraine Maple, DO; no medication changes indicated.   Hospital visits:  None in previous 6 months   Objective:  Lab Results  Component Value Date   CREATININE 1.06 (H) 05/15/2022   BUN 11 05/15/2022   GFR 61.98 05/07/2022   EGFR 38 (L) 10/02/2021   GFRNONAA 54 (L) 01/04/2020   GFRAA 62 01/04/2020   NA 143 05/15/2022   K 4.1 05/15/2022   CALCIUM 9.5 05/15/2022   CO2 32 05/15/2022   GLUCOSE 103 (H) 05/15/2022    Lab Results  Component Value Date/Time   GFR 61.98 05/07/2022 11:30 AM   GFR 53.92 (L) 12/31/2020 12:11 PM    Last diabetic Eye exam: No results found for: "HMDIABEYEEXA"  Last diabetic Foot exam: No results found for: "HMDIABFOOTEX"   Lab Results  Component Value Date   CHOL 127 08/31/2020   HDL 48 (L)  08/31/2020   LDLCALC 65 08/31/2020   TRIG 63 08/31/2020   CHOLHDL 2.6 08/31/2020       Latest Ref Rng & Units 05/07/2022   11:30 AM 12/31/2020   12:11 PM 08/31/2020    8:18 AM  Hepatic Function  Total Protein 6.0 - 8.3 g/dL 7.4  7.0  6.4   Albumin 3.5 - 5.2 g/dL 4.6  4.3    AST 0 - 37 U/L ALT 0 - 35 U/L Alk Phosphatase 39 - 117 U/L 43  41    Total Bilirubin 0.2 - 1.2 mg/dL 0.5  0.5  0.3     Lab Results  Component Value Date/Time   TSH 2.020 12/27/2019 09:54 AM   TSH 1.88 10/29/2015 09:02 AM       Latest Ref Rng & Units 05/07/2022   11:30 AM 12/31/2020   12:11 PM 08/31/2020    8:18 AM  CBC  WBC 4.0 - 10.5 K/uL 7.6  8.3  7.4   Hemoglobin 12.0 - 15.0 g/dL 40.9  81.1  91.4   Hematocrit 36.0 - 46.0 % 42.6  39.3  38.6   Platelets 150.0 - 400.0 K/uL 225.0  209.0  222     No results found for: "VD25OH", "VITAMINB12"  Clinical ASCVD: No  The ASCVD Risk score (Arnett DK, et al., 2019) failed to calculate for the  following reasons:   The valid total cholesterol range is 130 to 320 mg/dL        02/01/1094    0:45 PM 12/24/2021    9:09 AM 11/27/2021   11:40 AM  Depression screen PHQ 2/9  Decreased Interest 0 0 1  Down, Depressed, Hopeless 0 0 1  PHQ - 2 Score 0 0 2     Social History   Tobacco Use  Smoking Status Never  Smokeless Tobacco Never   BP Readings from Last 3 Encounters:  05/07/22 128/88  02/28/22 132/72  12/27/21 132/70   Pulse Readings from Last 3 Encounters:  05/07/22 75  02/28/22 77  12/27/21 74   Wt Readings from Last 3 Encounters:  05/07/22 252 lb 4 oz (114.4 kg)  02/28/22 253 lb (114.8 kg)  12/27/21 256 lb (116.1 kg)   BMI Readings from Last 3 Encounters:  05/07/22 43.30 kg/m  02/28/22 43.43 kg/m  12/27/21 43.94 kg/m    Allergies  Allergen Reactions   Aldactone [Spironolactone] Rash    Medications Reviewed Today     Reviewed by Erroll Luna, RPH (Pharmacist) on 05/16/22 at 1606  Med List Status:  <None>   Medication Order Taking? Sig Documenting Provider Last Dose Status Informant  acetaminophen (TYLENOL) 650 MG CR tablet [id number] No Take by mouth as needed for pain. [provider] Taking Active   albuterol (VENTOLIN HFA) 108 (90 Base) MCG/ACT inhaler [id number] No INHALE 2 PUFFS BY MOUTH EVERY 4 HOURS AS NEEDED FOR WHEEZING FOR SHORTNESS OF BREATH Donita Brooks, MD Taking Active   diltiazem (CARDIZEM CD) 360 MG 24 hr capsule [id number] No TAKE 1 CAPSULE BY MOUTH ONCE DAILY . APPOINTMENT REQUIRED WITH PCP FOR FUTURE REFILLS Donita Brooks, MD Taking Active   doxazosin (CARDURA) 2 MG tablet [id number] No Take 1 tablet by mouth once daily Donita Brooks, MD Taking Active   empagliflozin (JARDIANCE) 10 MG TABS tablet [id number] No Take 1 tablet (10 mg total) by mouth daily before breakfast. Christell Constant, MD Taking Active   fluticasone (FLONASE) 50 MCG/ACT nasal spray [id number] No Place 2 sprays into both nostrils daily.  Patient taking differently: Place 2 sprays into both nostrils as needed for allergies.   Valentino Nose, NP Taking Active   furosemide (LASIX) 40 MG tablet [id number] No Take 1.5 tablets (60 mg total) by mouth 2 (two) times daily. Christell Constant, MD Taking Active   lisinopril (ZESTRIL) 40 MG tablet [id number] No Take 1 tablet (40 mg total) by mouth daily. Christell Constant, MD Taking Active   loratadine (CLARITIN) 10 MG tablet [id number] No Take 1 tablet by mouth daily. [provider] Taking Active   Multiple Vitamins-Minerals (CENTRUM MINIS WOMEN 50+) TABS [id number] No Take by mouth daily at 6 (six) AM. [provider] Taking Active   omeprazole (PRILOSEC) 40 MG capsule [id number] No Take 1 capsule by mouth once daily  Patient taking differently: as needed (heartburn).   Donita Brooks, MD Taking Active   Polyethylene Glycol 3350 (MIRALAX PO) [id number] No Take by mouth as needed. [provider] Taking  Active   rosuvastatin (CRESTOR) 10 MG tablet [id number] No Take 1 tablet by mouth once daily Chandrasekhar, Mahesh A, MD Taking Active   spironolactone (ALDACTONE) 25 MG tablet [id number] No Take 0.5 tablets (12.5 mg total) by mouth daily. Donita Brooks, MD Taking Active             SDOH:  (Social Determinants of  Health) assessments and interventions performed: No, done within past year Financial Resource Strain: Low Risk  (06/13/2021)   Overall Financial Resource Strain (CARDIA)    Difficulty of Paying Living Expenses: Not hard at all   Food Insecurity: No Food Insecurity (06/19/2021)   Hunger Vital Sign    Worried About Running Out of Food in the Last Year: Never true    Ran Out of Food in the Last Year: Never true    SDOH Interventions    Flowsheet Row Telephone from 06/19/2021 in [facility] Chronic Care Management from 06/18/2021 in [facility] Clinical Support from 06/13/2021 in [facility] Clinical Support from 05/31/2020 in [facility]  SDOH Interventions      Food Insecurity Interventions Other (Comment)  [place] [employer] list of food pantries] -- Intervention Not Indicated Intervention Not Indicated  Housing Interventions -- -- Intervention Not Indicated Intervention Not Indicated  Transportation Interventions -- -- Intervention Not Indicated Intervention Not Indicated  Depression Interventions/Treatment  -- Counseling Counseling Counseling  Financial Strain Interventions -- -- Intervention Not Indicated Intervention Not Indicated  Physical Activity Interventions -- -- Other (Comments) Intervention Not Indicated  Stress Interventions -- -- Provide Counseling Intervention Not Indicated  Social Connections Interventions -- Local [employer], Other (Comment) Intervention Not Indicated Intervention Not Indicated       Medication Assistance: None required.  Patient affirms current coverage meets needs.  Medication  Access: Within the past 30 days, how often has patient missed a dose of medication? 0 Is a pillbox or other method used to improve adherence? No  Factors that may affect medication adherence? no barriers identified Are meds synced by current pharmacy? No  Are meds delivered by current pharmacy? No  Does patient experience delays in picking up medications due to transportation concerns? No   [facility] Services Reviewed: Is patient disadvantaged to use [facility]?: No  Current Rx insurance plan: [employer] Name and location of Current pharmacy:  [id number] - [place] - [address] [place] [address] Phone: [phone] Fax: [phone]  [facility] services reviewed with patient today?: Yes  Patient requests to transfer care to [facility]?: No  Reason patient declined to change pharmacies: Loyalty to other pharmacy/Patient preference  Compliance/Adherence/Medication fill history: Care Gaps: None  Star-Rating Drugs: Lisinopril 40mg  04/06/22 90ds Jardiance 10mg  05/02/22 30ds Rosuvastatin 10mg  03/28/22 90ds   Assessment/Plan      Hypertension (BP goal <140/90) -Controlled -Current treatment: Lisinopril 40mg  daily Diltiazem CD 360mg  24hr daily Doxazosin 2mg  daily -Medications previously tried: HCTZ  -Current home readings: 127/80 -Current exercise habits: minimal -Denies hypotensive/hypertensive symptoms -Educated on BP goals and benefits of medications for prevention of heart attack, stroke and kidney damage; Exercise goal of 150 minutes per week; Importance of home blood pressure monitoring; Symptoms of hypotension and importance of maintaining adequate hydration; -Counseled to monitor BP at home a few times per week, document, and provide log at future appointments -Counseled on diet and exercise extensively Recommended to continue current medication Recommended she participate in  water aerobics or water exercise of some kind using silver sneakers or at the Aquatic center.  Update 11/15/20 135/88 BP today she reports it has been controlled around this area for the most part at home.  Denies any symptoms. She has not started her aerobics at the aquatic center but still plans to do so.   Continue current meds for now, reinforced importance of physical activity  and effect on BP.  Continue current meds  Hyperlipidemia: (LDL goal < 100) 05/15/22 -Due for updated lipids -Current treatment: Rosuvastatin 10mg  daily Appropriate, Effective, ,  -Medications previously tried: none noted  -Educated on Cholesterol goals;  Benefits of statin for ASCVD risk reduction; Importance of limiting foods high in cholesterol; Strategies to manage statin-induced myalgias; -Continue to take statin - due for updated lipids.   Recommend come fasting at next physical so we can get updated labs.  Update 11/15/20 Most recent lipid panel from August shows improved lipids!  LDL down to 65 from a previous result of 119.  Congratulated patient in her efforts.  Continues to be adherent with medication and denies any adverse effects as a result of taking this med.  Continue current meds for now.  Heart Failure (Goal: manage symptoms and prevent exacerbations) 05/15/22 -Controlled -Last ejection fraction: 60-65% -Current treatment: Furosemide 60mg  twice daily Appropriate, Query effective, Jardiance 10mg  Appropriate, Effective, Safe, Accessible Spironolactone 12.5mg  daily Appropriate, Effective, Safe, Accessible -Medications previously tried: none noted  -Current home BP/HR readings: well controlled -Educated on Benefits of medications for managing symptoms and prolonging life Importance of weighing daily; if you gain more than 3 pounds in one day or 5 pounds in one week, contact providers Importance of blood pressure control -Still has difficulty with swelling in her feet.  States it is  constantly there.  Even on daily Lasix and current GDMT for her HF.  Kidney function at most recent labs were normal.  Could consider increase in Jardiance or prn metolazone for excessive swelling.  The good new is she states no shortness of breath or symptoms associated with this swelling.  Arthritis (Goal: Minimize symptoms)  Controlled, not assessed -Current treatment  Tylenol 650mg  CR Appropriate, Effective, Safe, Accessible -Medications previously tried: none noted -Still having some pain  -Counseled on diet and exercise extensively Recommended to continue current medication -Patient stopped her Naproxen due to declining renal function.  Now taking only Tylenol.  Counseled on avoidance of other NSAIDs.  Pain relatively managed at this time. No changes, continue to monitor renal function.        Willa Frater, PharmD, CPP Clinical Pharmacist Practitioner [facility] [phone]

## 2022-05-16 LAB — BASIC METABOLIC PANEL
BUN/Creatinine Ratio: 10 (calc) (ref 6–22)
BUN: 11 mg/dL (ref 7–25)
CO2: 32 mmol/L (ref 20–32)
Calcium: 9.5 mg/dL (ref 8.6–10.4)
Chloride: 103 mmol/L (ref 98–110)
Creat: 1.06 mg/dL — ABNORMAL HIGH (ref 0.60–1.00)
Glucose, Bld: 103 mg/dL — ABNORMAL HIGH (ref 65–99)
Potassium: 4.1 mmol/L (ref 3.5–5.3)
Sodium: 143 mmol/L (ref 135–146)

## 2022-05-20 ENCOUNTER — Ambulatory Visit (HOSPITAL_COMMUNITY)
Admission: RE | Admit: 2022-05-20 | Discharge: 2022-05-20 | Disposition: A | Discharge status: Home / Self Care | Payer: Medicare HMO | Source: Ambulatory Visit | Attending: Nurse Practitioner | Admitting: Nurse Practitioner

## 2022-05-20 DIAGNOSIS — R1031 Right lower quadrant pain: Secondary | ICD-10-CM | POA: Insufficient documentation

## 2022-05-20 DIAGNOSIS — N200 Calculus of kidney: Secondary | ICD-10-CM | POA: Diagnosis not present

## 2022-05-20 DIAGNOSIS — K5730 Diverticulosis of large intestine without perforation or abscess without bleeding: Secondary | ICD-10-CM | POA: Diagnosis not present

## 2022-05-20 DIAGNOSIS — N281 Cyst of kidney, acquired: Secondary | ICD-10-CM | POA: Diagnosis not present

## 2022-05-20 MED ORDER — IOHEXOL 9 MG/ML PO SOLN: 500.0000 mL | ORAL | Status: AC

## 2022-05-20 MED ORDER — SODIUM CHLORIDE (PF) 0.9 % IJ SOLN
INTRAMUSCULAR | Status: AC
  Filled 2022-05-20: qty 50

## 2022-05-20 MED ORDER — IOHEXOL 300 MG/ML  SOLN
100.0000 mL | Freq: Once | INTRAMUSCULAR | Status: AC | PRN
  Administered 2022-05-20: 100 mL via INTRAVENOUS

## 2022-05-20 MED ORDER — IOHEXOL 9 MG/ML PO SOLN
500.0000 mL | ORAL | Status: AC
  Administered 2022-05-20: 1000 mL via ORAL

## 2022-05-30 ENCOUNTER — Encounter: Payer: Self-pay | Admitting: Family Medicine

## 2022-05-30 ENCOUNTER — Ambulatory Visit (INDEPENDENT_AMBULATORY_CARE_PROVIDER_SITE_OTHER): Payer: Medicare HMO | Admitting: Family Medicine

## 2022-05-30 VITALS — BP 126/84 | HR 70 | Temp 98.3°F | Ht 64.0 in | Wt 251.4 lb

## 2022-05-30 DIAGNOSIS — Z780 Asymptomatic menopausal state: Secondary | ICD-10-CM

## 2022-05-30 DIAGNOSIS — I10 Essential (primary) hypertension: Secondary | ICD-10-CM

## 2022-05-30 DIAGNOSIS — E7800 Pure hypercholesterolemia, unspecified: Secondary | ICD-10-CM | POA: Diagnosis not present

## 2022-05-30 DIAGNOSIS — M48061 Spinal stenosis, lumbar region without neurogenic claudication: Secondary | ICD-10-CM

## 2022-05-30 DIAGNOSIS — Z0001 Encounter for general adult medical examination with abnormal findings: Secondary | ICD-10-CM

## 2022-05-30 DIAGNOSIS — Z0000 Encounter for general adult medical examination without abnormal findings: Secondary | ICD-10-CM

## 2022-05-30 DIAGNOSIS — N1832 Chronic kidney disease, stage 3b: Secondary | ICD-10-CM

## 2022-05-30 DIAGNOSIS — I5032 Chronic diastolic (congestive) heart failure: Secondary | ICD-10-CM | POA: Diagnosis not present

## 2022-05-30 MED ORDER — POTASSIUM CHLORIDE CRYS ER 20 MEQ PO TBCR: 20.0000 meq | EXTENDED_RELEASE_TABLET | Freq: Every day | ORAL | 3 refills | Status: DC

## 2022-05-30 NOTE — Progress Notes (Signed)
Subjective:    Patient ID: [name], female    DOB: 1945-05-27, 77 y.o.   MRN: [id number]  HPI Patient is a very pleasant 77 year old African-American female who presents today for complete physical exam.  Past medical history is significant for CHF with preserved EF and left ventricular septal hypertrophy seen on an echocardiogram.  Last echo 9/23 showed: 1. Mild concentric LVH with severe hypertrophy of basal septum.  2. Left ventricular ejection fraction, by estimation, is 60 to 65%. The  left ventricle has normal function. The left ventricle has no regional  wall motion abnormalities. There is severe left ventricular hypertrophy of  the basal-septal segment. Left  ventricular diastolic parameters are consistent with Grade I diastolic  dysfunction (impaired relaxation). The average left ventricular global  longitudinal strain is -25.5 %. The global longitudinal strain is normal.   3. Right ventricular systolic function is normal. The right ventricular  size is normal.   4. The mitral valve is normal in structure. Trivial mitral valve  regurgitation. No evidence of mitral stenosis.   5. The aortic valve is tricuspid. Aortic valve regurgitation is trivial.  Aortic valve sclerosis is present, with no evidence of aortic valve  stenosis.   6. The inferior vena cava is normal in size with greater than 50%  respiratory variability, suggesting right atrial pressure of 3 mmHg.   Patient had mammogram 4/24 which was normal and last colonoscopy was 2020 which showed one polyp (repeat in 2025).  Last DEXA was 2018 and is due.  Appears to be due for Shingrix and covid booster. Immunization History  Administered Date(s) Administered   Fluad Quad(high Dose 65+) 10/06/2018, 11/07/2019, 11/14/2020, 11/04/2021   Influenza, High Dose Seasonal PF 12/03/2017   Influenza,inj,Quad PF,6+ Mos 11/06/2015, 11/03/2016   PFIZER(Purple Top)SARS-COV-2 Vaccination 02/15/2019, 03/07/2019, 12/05/2019    Pneumococcal Conjugate-13 10/20/2014   Pneumococcal Polysaccharide-23 06/17/2012  Patient reports constant pain in her knees, and her back, and in her neck.  She would like to try physical therapy.  She is taking almost 2000 mg of Tylenol a day.  She recently had a CAT scan of the abdomen and pelvis.  This showed a relatively stable cystic lesion in the right adnexa.  It has not grown considerably since 2016 and therefore radiology believes is most likely benign.  She does have swelling in her legs today +1 pitting edema bilaterally that she is only taking 1 furosemide a day  Past Medical History:  Diagnosis Date   Allergy    Arthritis    arthritis -knees   Colon polyps    benign- last colonoscopy 09-2008   Depression    GERD (gastroesophageal reflux disease)    hx GERD   H/O seasonal allergies    no problems now   Hyperlipidemia    Hypertension    Murmur, heart    Spinal stenosis of lumbar region at multiple levels    Past Surgical History:  Procedure Laterality Date   ABDOMINAL HYSTERECTOMY     APPENDECTOMY     with gallbladder surgery   BUNIONECTOMY Bilateral    CHOLECYSTECTOMY     open-gallstones   COLONOSCOPY     EXCISION OF ADNEXAL MASS Left August 03, 2013   at [facility]/right adnexal nodule,excision;benigh lymph node   OOPHORECTOMY Bilateral 2008   ovarian cyst   POLYPECTOMY     Current Outpatient Medications on File Prior to Visit  Medication Sig Dispense Refill   acetaminophen (TYLENOL) 650 MG CR tablet Take by  mouth as needed for pain.     albuterol (VENTOLIN HFA) 108 (90 Base) MCG/ACT inhaler INHALE 2 PUFFS BY MOUTH EVERY 4 HOURS AS NEEDED FOR WHEEZING FOR SHORTNESS OF BREATH 18 g 0   diltiazem (CARDIZEM CD) 360 MG 24 hr capsule TAKE 1 CAPSULE BY MOUTH ONCE DAILY . APPOINTMENT REQUIRED WITH PCP FOR FUTURE REFILLS 30 capsule 0   doxazosin (CARDURA) 2 MG tablet Take 1 tablet by mouth once daily 90 tablet 0   empagliflozin (JARDIANCE) 10 MG TABS tablet Take 1 tablet  (10 mg total) by mouth daily before breakfast. 90 tablet 2   fluticasone (FLONASE) 50 MCG/ACT nasal spray Place 2 sprays into both nostrils daily. (Patient taking differently: Place 2 sprays into both nostrils as needed for allergies.) 16 g 6   furosemide (LASIX) 40 MG tablet Take 1.5 tablets (60 mg total) by mouth 2 (two) times daily. 270 tablet 3   lisinopril (ZESTRIL) 40 MG tablet Take 1 tablet (40 mg total) by mouth daily. 90 tablet 3   loratadine (CLARITIN) 10 MG tablet Take 1 tablet by mouth daily.     Multiple Vitamins-Minerals (CENTRUM MINIS WOMEN 50+) TABS Take by mouth daily at 6 (six) AM.     omeprazole (PRILOSEC) 40 MG capsule Take 1 capsule by mouth once daily (Patient taking differently: as needed (heartburn).) 90 capsule 3   Polyethylene Glycol 3350 (MIRALAX PO) Take by mouth as needed.     rosuvastatin (CRESTOR) 10 MG tablet Take 1 tablet by mouth once daily 90 tablet 3   spironolactone (ALDACTONE) 25 MG tablet Take 0.5 tablets (12.5 mg total) by mouth daily. 30 tablet 1   No current facility-administered medications on file prior to visit.   Allergies  Allergen Reactions   Aldactone [Spironolactone] Rash   Social History   Socioeconomic History   Marital status: Married    Spouse name: Not on file   Number of children: Not on file   Years of education: Not on file   Highest education level: Not on file  Occupational History   Not on file  Tobacco Use   Smoking status: Never   Smokeless tobacco: Never  Vaping Use   Vaping Use: Never used  Substance and Sexual Activity   Alcohol use: No   Drug use: No   Sexual activity: Yes  Other Topics Concern   Not on file  Social History Narrative   Not on file   Social Determinants of Health   Financial Resource Strain: Low Risk  (06/13/2021)   Overall Financial Resource Strain (CARDIA)    Difficulty of Paying Living Expenses: Not hard at all  Food Insecurity: No Food Insecurity (06/19/2021)   Hunger Vital Sign     Worried About Running Out of Food in the Last Year: Never true    Ran Out of Food in the Last Year: Never true  Transportation Needs: No Transportation Needs (06/13/2021)   PRAPARE - Administrator, Civil Service (Medical): No    Lack of Transportation (Non-Medical): No  Physical Activity: Insufficiently Active (06/13/2021)   Exercise Vital Sign    Days of Exercise per Week: 3 days    Minutes of Exercise per Session: 30 min  Stress: Stress Concern Present (06/13/2021)   [employer] of Occupational Health - Occupational Stress Questionnaire    Feeling of Stress : To some extent  Social Connections: Socially Integrated (06/13/2021)   Social Connection and Isolation Panel [NHANES]    Frequency of Communication with  Friends and Family: More than three times a week    Frequency of Social Gatherings with Friends and Family: Twice a week    Attends Religious Services: More than 4 times per year    Active Member of [employer] or Organizations: Yes    Attends Engineer, structural: More than 4 times per year    Marital Status: Married  Catering manager Violence: Not At Risk (06/13/2021)   Humiliation, Afraid, Rape, and Kick questionnaire    Fear of Current or Ex-Partner: No    Emotionally Abused: No    Physically Abused: No    Sexually Abused: No   Family History  Problem Relation Age of Onset   Colon cancer Mother 41   Heart attack Father    Breast cancer Paternal Aunt    Colon cancer Brother 68   Colon polyps Brother    Esophageal cancer Neg Hx    Pancreatic cancer Neg Hx    Rectal cancer Neg Hx    Stomach cancer Neg Hx       Review of Systems  All other systems reviewed and are negative.      Objective:   Physical Exam Vitals reviewed.  Constitutional:      General: She is not in acute distress.    Appearance: Normal appearance. She is normal weight. She is not ill-appearing, toxic-appearing or diaphoretic.  HENT:     Head: Normocephalic and atraumatic.      Right Ear: Tympanic membrane, ear canal and external ear normal. There is no impacted cerumen.     Left Ear: Tympanic membrane, ear canal and external ear normal. There is no impacted cerumen.     Nose: Nose normal. No congestion or rhinorrhea.     Mouth/Throat:     Mouth: Mucous membranes are moist.     Pharynx: No oropharyngeal exudate or posterior oropharyngeal erythema.  Eyes:     General: No scleral icterus.       Right eye: No discharge.        Left eye: No discharge.     Extraocular Movements: Extraocular movements intact.     Conjunctiva/sclera: Conjunctivae normal.     Pupils: Pupils are equal, round, and reactive to light.  Neck:     Vascular: No carotid bruit.  Cardiovascular:     Rate and Rhythm: Normal rate and regular rhythm.     Pulses: Normal pulses.     Heart sounds: Murmur heard.     No friction rub. No gallop.  Pulmonary:     Effort: Pulmonary effort is normal. No respiratory distress.     Breath sounds: Normal breath sounds. No stridor. No wheezing, rhonchi or rales.  Chest:     Chest wall: No tenderness.  Abdominal:     General: Bowel sounds are normal. There is no distension.     Palpations: Abdomen is soft. There is no mass.     Tenderness: There is no abdominal tenderness. There is no right CVA tenderness, left CVA tenderness, guarding or rebound.     Hernia: No hernia is present.  Musculoskeletal:        General: No swelling, deformity or signs of injury.     Right shoulder: Tenderness present. Decreased range of motion.     Cervical back: Normal range of motion and neck supple. Tenderness present. No rigidity. No muscular tenderness.     Lumbar back: Tenderness present.     Right knee: Decreased range of motion. Tenderness present.  Left knee: Decreased range of motion. Tenderness present.     Right lower leg: Edema present.     Left lower leg: Edema present.  Lymphadenopathy:     Cervical: No cervical adenopathy.  Skin:    General: Skin is  warm.     Coloration: Skin is not jaundiced or pale.     Findings: No bruising, erythema, lesion or rash.  Neurological:     General: No focal deficit present.     Mental Status: She is alert and oriented to person, place, and time. Mental status is at baseline.     Cranial Nerves: No cranial nerve deficit.     Sensory: No sensory deficit.     Motor: No weakness.     Coordination: Coordination normal.     Gait: Gait normal.     Deep Tendon Reflexes: Reflexes normal.  Psychiatric:        Mood and Affect: Mood normal.        Behavior: Behavior normal.        Thought Content: Thought content normal.        Judgment: Judgment normal.           Assessment & Plan:  Encounter for Medicare annual wellness exam  Chronic heart failure with preserved ejection fraction (HCC)  Stage 3b chronic kidney disease (HCC)  Essential (primary) hypertension  Pure hypercholesterolemia - Plan: Lipid panel  Spinal stenosis of lumbar region at multiple levels - Plan: Ambulatory referral to Physical Therapy  Postmenopausal estrogen deficiency - Plan: DG Bone Density Her blood pressure is excellent today.  I would like to increase her furosemide to 2 pills a day which is 40 mg because of the swelling.  I recommended compression hose as well.  Her mammogram is up-to-date.  She is due for a bone density test which I will schedule.  I will check a lipid panel.  Her most recent CBC and CMP have been within normal limits.  I would like to see her LDL cholesterol less than 811.  I will refer her to physical therapy due to her back pain and knee pain and neck pain.  I would be willing to give the patient narcotic pain medication to take sparingly however she is comfortable taking Tylenol at the present time.  I will schedule the patient for a bone density test to screen for osteoporosis.  The lesion in the right adnexa has been relatively stable since 2016.  I feel this is most likely benign.  Patient would like  to repeat imaging in 6 months simply to monitor.

## 2022-05-31 LAB — LIPID PANEL
Cholesterol: 122 mg/dL (ref <200)
HDL: 50 mg/dL (ref >=50)
LDL Cholesterol (Calc): 53 mg/dL (calc)
Non-HDL Cholesterol (Calc): 72 mg/dL (calc) (ref <130)
Total CHOL/HDL Ratio: 2.4 (calc) (ref <5.0)
Triglycerides: 104 mg/dL (ref <150)

## 2022-06-01 ENCOUNTER — Other Ambulatory Visit: Payer: Self-pay | Admitting: Family Medicine

## 2022-06-02 ENCOUNTER — Other Ambulatory Visit: Payer: Self-pay

## 2022-06-02 DIAGNOSIS — I5032 Chronic diastolic (congestive) heart failure: Secondary | ICD-10-CM

## 2022-06-02 DIAGNOSIS — I10 Essential (primary) hypertension: Secondary | ICD-10-CM

## 2022-06-02 MED ORDER — DILTIAZEM HCL ER COATED BEADS 360 MG PO CP24
ORAL_CAPSULE | ORAL | 1 refills | Status: DC
Start: 2022-06-02 — End: 2022-09-05

## 2022-06-02 NOTE — Telephone Encounter (Signed)
Requested Prescriptions  Refused Prescriptions Disp Refills   diltiazem (CARDIZEM CD) 360 MG 24 hr capsule [Pharmacy Med Name: dilTIAZem HCl ER Coated Beads 360 MG Oral Capsule Extended Release 24 Hour] 30 capsule 0    Sig: TAKE 1 CAPSULE BY MOUTH ONCE DAILY . APPOINTMENT REQUIRED FOR FUTURE REFILLS     Cardiovascular: Calcium Channel Blockers 3 Failed - 06/01/2022 12:33 PM      Failed - Cr in normal range and within 360 days    Creat  Date Value Ref Range Status  05/15/2022 1.06 (H) 0.60 - 1.00 mg/dL Final         Failed - Valid encounter within last 6 months    Recent Outpatient Visits           11 months ago Change in bowel habits   [facility] Pickard, Priscille Heidelberg, MD   1 year ago Ingrown thumb nail, right   [facility] Pickard, Priscille Heidelberg, MD   1 year ago General medical exam   [facility] Donita Brooks, MD   1 year ago Sinus pressure   [facility] Valentino Nose, NP   2 years ago Spinal stenosis of lumbar region at multiple levels   [facility] Pickard, Priscille Heidelberg, MD              Passed - ALT in normal range and within 360 days    ALT  Date Value Ref Range Status  05/07/2022 12 0 - 35 U/L Final         Passed - AST in normal range and within 360 days    AST  Date Value Ref Range Status  05/07/2022 16 0 - 37 U/L Final         Passed - Last BP in normal range    BP Readings from Last 1 Encounters:  05/30/22 126/84         Passed - Last Heart Rate in normal range    Pulse Readings from Last 1 Encounters:  05/30/22 70

## 2022-06-09 DIAGNOSIS — M5416 Radiculopathy, lumbar region: Secondary | ICD-10-CM | POA: Diagnosis not present

## 2022-06-18 NOTE — Patient Instructions (Incomplete)
[name] , Thank you for taking time to come for your Medicare Wellness Visit. I appreciate your ongoing commitment to your health goals. Please review the following plan we discussed and let me know if I can assist you in the future.   These are the goals we discussed:  Goals      Exercise 3x per week (30 min per time)     Have 3 meals a day     Prevent falls     Weight (lb) < 200 lb (90.7 kg)        This is a list of the screening recommended for you and due dates:  Health Maintenance  Topic Date Due   COVID-19 Vaccine (4 - 2023-24 season) 09/27/2021   Zoster (Shingles) Vaccine (1 of 2) 08/30/2022*   Flu Shot  08/28/2022   Mammogram  05/09/2023   Medicare Annual Wellness Visit  06/19/2023   Colon Cancer Screening  12/15/2023   Pneumonia Vaccine  Completed   DEXA scan (bone density measurement)  Completed   Hepatitis C Screening: USPSTF Recommendation to screen - Ages 65-79 yo.  Completed   HPV Vaccine  Aged Out   DTaP/Tdap/Td vaccine  Discontinued  *Topic was postponed. The date shown is not the original due date.    Advanced directives: Information on Advanced Care Planning can be found at [facility] Advance Health Care Directives Advance Health Care Directives ([email or web address]    Conditions/risks identified: Aim for 30 minutes of exercise or brisk walking, 6-8 glasses of water, and 5 servings of fruits and vegetables each day.   Next appointment: Follow up in one year for your annual wellness visit    Preventive Care 65 Years and Older, Female Preventive care refers to lifestyle choices and visits with your health care provider that can promote health and wellness. What does preventive care include? A yearly physical exam. This is also called an annual well check. Dental exams once or twice a year. Routine eye exams. Ask your health care provider how often you should have your eyes checked. Personal lifestyle choices, including: Daily care of your  teeth and gums. Regular physical activity. Eating a healthy diet. Avoiding tobacco and drug use. Limiting alcohol use. Practicing safe sex. Taking low-dose aspirin every day. Taking vitamin and mineral supplements as recommended by your health care provider. What happens during an annual well check? The services and screenings done by your health care provider during your annual well check will depend on your age, overall health, lifestyle risk factors, and family history of disease. Counseling  Your health care provider may ask you questions about your: Alcohol use. Tobacco use. Drug use. Emotional well-being. Home and relationship well-being. Sexual activity. Eating habits. History of falls. Memory and ability to understand (cognition). Work and work Astronomer. Reproductive health. Screening  You may have the following tests or measurements: Height, weight, and BMI. Blood pressure. Lipid and cholesterol levels. These may be checked every 5 years, or more frequently if you are over 76 years old. Skin check. Lung cancer screening. You may have this screening every year starting at age 61 if you have a 30-pack-year history of smoking and currently smoke or have quit within the past 15 years. Fecal occult blood test (FOBT) of the stool. You may have this test every year starting at age 11. Flexible sigmoidoscopy or colonoscopy. You may have a sigmoidoscopy every 5 years or a colonoscopy every 10 years starting at age 79. Hepatitis C blood  test. Hepatitis B blood test. Sexually transmitted disease (STD) testing. Diabetes screening. This is done by checking your blood sugar (glucose) after you have not eaten for a while (fasting). You may have this done every 1-3 years. Bone density scan. This is done to screen for osteoporosis. You may have this done starting at age 58. Mammogram. This may be done every 1-2 years. Talk to your health care provider about how often you should have  regular mammograms. Talk with your health care provider about your test results, treatment options, and if necessary, the need for more tests. Vaccines  Your health care provider may recommend certain vaccines, such as: Influenza vaccine. This is recommended every year. Tetanus, diphtheria, and acellular pertussis (Tdap, Td) vaccine. You may need a Td booster every 10 years. Zoster vaccine. You may need this after age 64. Pneumococcal 13-valent conjugate (PCV13) vaccine. One dose is recommended after age 16. Pneumococcal polysaccharide (PPSV23) vaccine. One dose is recommended after age 12. Talk to your health care provider about which screenings and vaccines you need and how often you need them. This information is not intended to replace advice given to you by your health care provider. Make sure you discuss any questions you have with your health care provider. Document Released: 02/09/2015 Document Revised: 10/03/2015 Document Reviewed: 11/14/2014 Elsevier Interactive Patient Education  2017 [employer].  Fall Prevention in the Home Falls can cause injuries. They can happen to people of all ages. There are many things you can do to make your home safe and to help prevent falls. What can I do on the outside of my home? Regularly fix the edges of walkways and driveways and fix any cracks. Remove anything that might make you trip as you walk through a door, such as a raised step or threshold. Trim any bushes or trees on the path to your home. Use bright outdoor lighting. Clear any walking paths of anything that might make someone trip, such as rocks or tools. Regularly check to see if handrails are loose or broken. Make sure that both sides of any steps have handrails. Any raised decks and porches should have guardrails on the edges. Have any leaves, snow, or ice cleared regularly. Use sand or salt on walking paths during winter. Clean up any spills in your garage right away. This  includes oil or grease spills. What can I do in the bathroom? Use night lights. Install grab bars by the toilet and in the tub and shower. Do not use towel bars as grab bars. Use non-skid mats or decals in the tub or shower. If you need to sit down in the shower, use a plastic, non-slip stool. Keep the floor dry. Clean up any water that spills on the floor as soon as it happens. Remove soap buildup in the tub or shower regularly. Attach bath mats securely with double-sided non-slip rug tape. Do not have throw rugs and other things on the floor that can make you trip. What can I do in the bedroom? Use night lights. Make sure that you have a light by your bed that is easy to reach. Do not use any sheets or blankets that are too big for your bed. They should not hang down onto the floor. Have a firm chair that has side arms. You can use this for support while you get dressed. Do not have throw rugs and other things on the floor that can make you trip. What can I do in the kitchen? Clean  up any spills right away. Avoid walking on wet floors. Keep items that you use a lot in easy-to-reach places. If you need to reach something above you, use a strong step stool that has a grab bar. Keep electrical cords out of the way. Do not use floor polish or wax that makes floors slippery. If you must use wax, use non-skid floor wax. Do not have throw rugs and other things on the floor that can make you trip. What can I do with my stairs? Do not leave any items on the stairs. Make sure that there are handrails on both sides of the stairs and use them. Fix handrails that are broken or loose. Make sure that handrails are as long as the stairways. Check any carpeting to make sure that it is firmly attached to the stairs. Fix any carpet that is loose or worn. Avoid having throw rugs at the top or bottom of the stairs. If you do have throw rugs, attach them to the floor with carpet tape. Make sure that you  have a light switch at the top of the stairs and the bottom of the stairs. If you do not have them, ask someone to add them for you. What else can I do to help prevent falls? Wear shoes that: Do not have high heels. Have rubber bottoms. Are comfortable and fit you well. Are closed at the toe. Do not wear sandals. If you use a stepladder: Make sure that it is fully opened. Do not climb a closed stepladder. Make sure that both sides of the stepladder are locked into place. Ask someone to hold it for you, if possible. Clearly mark and make sure that you can see: Any grab bars or handrails. First and last steps. Where the edge of each step is. Use tools that help you move around (mobility aids) if they are needed. These include: Canes. Walkers. Scooters. Crutches. Turn on the lights when you go into a dark area. Replace any light bulbs as soon as they burn out. Set up your furniture so you have a clear path. Avoid moving your furniture around. If any of your floors are uneven, fix them. If there are any pets around you, be aware of where they are. Review your medicines with your doctor. Some medicines can make you feel dizzy. This can increase your chance of falling. Ask your doctor what other things that you can do to help prevent falls. This information is not intended to replace advice given to you by your health care provider. Make sure you discuss any questions you have with your health care provider. Document Released: 11/09/2008 Document Revised: 06/21/2015 Document Reviewed: 02/17/2014 Elsevier Interactive Patient Education  2017 [employer].

## 2022-06-18 NOTE — Progress Notes (Signed)
Subjective:   [name] is a 77 y.o. female who presents for Medicare Annual (Subsequent) preventive examination.  Review of Systems    ***       Objective:    There were no vitals filed for this visit. There is no height or weight on file to calculate BMI.     06/13/2021    8:23 AM 09/04/2020   10:39 AM 05/31/2020    8:32 AM 12/05/2013   11:05 AM 07/14/2013   11:02 AM  Advanced Directives  Does Patient Have a Medical Advance Directive? No No No No Patient does not have advance directive;Patient would not like information  Would patient like information on creating a medical advance directive? No - Patient declined No - Patient declined     Pre-existing out of facility DNR order (yellow form or pink MOST form)     No    Current Medications (verified) Outpatient Encounter Medications as of 06/19/2022  Medication Sig   acetaminophen (TYLENOL) 650 MG CR tablet Take by mouth as needed for pain.   albuterol (VENTOLIN HFA) 108 (90 Base) MCG/ACT inhaler INHALE 2 PUFFS BY MOUTH EVERY 4 HOURS AS NEEDED FOR WHEEZING FOR SHORTNESS OF BREATH   diltiazem (CARDIZEM CD) 360 MG 24 hr capsule TAKE 1 CAPSULE BY MOUTH ONCE DAILY   doxazosin (CARDURA) 2 MG tablet Take 1 tablet by mouth once daily   empagliflozin (JARDIANCE) 10 MG TABS tablet Take 1 tablet (10 mg total) by mouth daily before breakfast.   fluticasone (FLONASE) 50 MCG/ACT nasal spray Place 2 sprays into both nostrils daily. (Patient taking differently: Place 2 sprays into both nostrils as needed for allergies.)   furosemide (LASIX) 40 MG tablet Take 1.5 tablets (60 mg total) by mouth 2 (two) times daily.   lisinopril (ZESTRIL) 40 MG tablet Take 1 tablet (40 mg total) by mouth daily.   loratadine (CLARITIN) 10 MG tablet Take 1 tablet by mouth daily.   Multiple Vitamins-Minerals (CENTRUM MINIS WOMEN 50+) TABS Take by mouth daily at 6 (six) AM.   omeprazole (PRILOSEC) 40 MG capsule Take 1 capsule by mouth once daily (Patient taking  differently: as needed (heartburn).)   Polyethylene Glycol 3350 (MIRALAX PO) Take by mouth as needed.   potassium chloride SA (KLOR-CON M) 20 MEQ tablet Take 1 tablet (20 mEq total) by mouth daily.   rosuvastatin (CRESTOR) 10 MG tablet Take 1 tablet by mouth once daily   spironolactone (ALDACTONE) 25 MG tablet Take 0.5 tablets (12.5 mg total) by mouth daily.   No facility-administered encounter medications on file as of 06/19/2022.    Allergies (verified) Aldactone [spironolactone]   History: Past Medical History:  Diagnosis Date   Allergy    Arthritis    arthritis -knees   Colon polyps    benign- last colonoscopy 09-2008   Depression    GERD (gastroesophageal reflux disease)    hx GERD   H/O seasonal allergies    no problems now   Hyperlipidemia    Hypertension    Murmur, heart    Spinal stenosis of lumbar region at multiple levels    Past Surgical History:  Procedure Laterality Date   ABDOMINAL HYSTERECTOMY     APPENDECTOMY     with gallbladder surgery   BUNIONECTOMY Bilateral    CHOLECYSTECTOMY     open-gallstones   COLONOSCOPY     EXCISION OF ADNEXAL MASS Left August 03, 2013   at [facility]/right adnexal nodule,excision;benigh lymph node   OOPHORECTOMY Bilateral  2008   ovarian cyst   POLYPECTOMY     Family History  Problem Relation Age of Onset   Colon cancer Mother 92   Heart attack Father    Breast cancer Paternal Aunt    Colon cancer Brother 76   Colon polyps Brother    Esophageal cancer Neg Hx    Pancreatic cancer Neg Hx    Rectal cancer Neg Hx    Stomach cancer Neg Hx    Social History   Socioeconomic History   Marital status: Married    Spouse name: Not on file   Number of children: Not on file   Years of education: Not on file   Highest education level: Not on file  Occupational History   Not on file  Tobacco Use   Smoking status: Never   Smokeless tobacco: Never  Vaping Use   Vaping Use: Never used  Substance and Sexual Activity    Alcohol use: No   Drug use: No   Sexual activity: Yes  Other Topics Concern   Not on file  Social History Narrative   Not on file   Social Determinants of Health   Financial Resource Strain: Low Risk  (06/13/2021)   Overall Financial Resource Strain (CARDIA)    Difficulty of Paying Living Expenses: Not hard at all  Food Insecurity: No Food Insecurity (06/19/2021)   Hunger Vital Sign    Worried About Running Out of Food in the Last Year: Never true    Ran Out of Food in the Last Year: Never true  Transportation Needs: No Transportation Needs (06/13/2021)   PRAPARE - Administrator, Civil Service (Medical): No    Lack of Transportation (Non-Medical): No  Physical Activity: Insufficiently Active (06/13/2021)   Exercise Vital Sign    Days of Exercise per Week: 3 days    Minutes of Exercise per Session: 30 min  Stress: Stress Concern Present (06/13/2021)   [employer] of Occupational Health - Occupational Stress Questionnaire    Feeling of Stress : To some extent  Social Connections: Socially Integrated (06/13/2021)   Social Connection and Isolation Panel [NHANES]    Frequency of Communication with Friends and Family: More than three times a week    Frequency of Social Gatherings with Friends and Family: Twice a week    Attends Religious Services: More than 4 times per year    Active Member of [employer] or Organizations: Yes    Attends Engineer, structural: More than 4 times per year    Marital Status: Married    Tobacco Counseling Counseling given: Not Answered   Clinical Intake:                 Diabetic?No          Activities of Daily Living     No data to display          Patient Care Team: Donita Brooks, MD as PCP - General (Family Medicine) Christell Constant, MD as PCP - Cardiology (Cardiology) Erroll Luna, [facility] as Pharmacist (Pharmacist)  Indicate any recent Medical Services you may have received from other  than [facility] providers in the past year (date may be approximate).     Assessment:   This is a routine wellness examination for Maizey.  Hearing/Vision screen No results found.  Dietary issues and exercise activities discussed:     Goals Addressed   None    Depression Screen    05/30/2022  10:35 AM 02/28/2022    2:32 PM 12/24/2021    9:09 AM 11/27/2021   11:40 AM 06/18/2021    1:11 PM 06/13/2021    8:19 AM 09/04/2020   10:37 AM  PHQ 2/9 Scores  PHQ - 2 Score 0 0 0 2 4 2 3   PHQ- 9 Score     7 3 14     Fall Risk    05/30/2022   10:31 AM 02/28/2022    2:32 PM 12/24/2021    9:09 AM 06/13/2021    8:23 AM 09/04/2020   10:37 AM  Fall Risk   Falls in the past year? 0 0 0 0 0  Number falls in past yr: 0 0 0 0 0  Injury with Fall? 0 0 0 0 0  Risk for fall due to : No Fall Risks No Fall Risks No Fall Risks No Fall Risks Impaired balance/gait  Follow up Falls prevention discussed Falls prevention discussed Falls prevention discussed Falls prevention discussed Falls evaluation completed    FALL RISK PREVENTION PERTAINING TO THE HOME:  Any stairs in or around the home? {YES/NO:21197} If so, are there any without handrails? {YES/NO:21197} Home free of loose throw rugs in walkways, pet beds, electrical cords, etc? {YES/NO:21197} Adequate lighting in your home to reduce risk of falls? {YES/NO:21197}  ASSISTIVE DEVICES UTILIZED TO PREVENT FALLS:  Life alert? {YES/NO:21197} Use of a cane, walker or w/c? {YES/NO:21197} Grab bars in the bathroom? {YES/NO:21197} Shower chair or bench in shower? {YES/NO:21197} Elevated toilet seat or a handicapped toilet? {YES/NO:21197}  TIMED UP AND GO:  Was the test performed? No . Telephonic visit  Cognitive Function:        06/13/2021    8:28 AM 05/31/2020    8:46 AM  6CIT Screen  What Year? 0 points 0 points  What month? 0 points 0 points  What time? 0 points 0 points  Count back from 20 0 points 0 points  Months in reverse 0 points 0 points   Repeat phrase 0 points 0 points  Total Score 0 points 0 points    Immunizations Immunization History  Administered Date(s) Administered   Fluad Quad(high Dose 65+) 10/06/2018, 11/07/2019, 11/14/2020, 11/04/2021   Influenza, High Dose Seasonal PF 12/03/2017   Influenza,inj,Quad PF,6+ Mos 11/06/2015, 11/03/2016   PFIZER(Purple Top)SARS-COV-2 Vaccination 02/15/2019, 03/07/2019, 12/05/2019   Pneumococcal Conjugate-13 10/20/2014   Pneumococcal Polysaccharide-23 06/17/2012    TDAP status: Up to date  Pneumococcal vaccine status: Up to date  Covid-19 vaccine status: Information provided on how to obtain vaccines.   Qualifies for Shingles Vaccine? Yes   Zostavax completed No   Shingrix Completed?: No.    Education has been provided regarding the importance of this vaccine. Patient has been advised to call insurance company to determine out of pocket expense if they have not yet received this vaccine. Advised may also receive vaccine at local pharmacy or Health Dept. Verbalized acceptance and understanding.  Screening Tests Health Maintenance  Topic Date Due   COVID-19 Vaccine (4 - 2023-24 season) 09/27/2021   Zoster Vaccines- Shingrix (1 of 2) 08/30/2022 (Originally 06/11/1997)   INFLUENZA VACCINE  08/28/2022   MAMMOGRAM  05/09/2023   Medicare Annual Wellness (AWV)  05/30/2023   COLONOSCOPY (Pts 45-36yrs Insurance coverage will need to be confirmed)  12/15/2023   Pneumonia Vaccine 37+ Years old  Completed   DEXA SCAN  Completed   Hepatitis C Screening  Completed   HPV VACCINES  Aged Out   DTaP/Tdap/Td  Discontinued  Health Maintenance  Health Maintenance Due  Topic Date Due   COVID-19 Vaccine (4 - 2023-24 season) 09/27/2021    Colorectal cancer screening: Type of screening: Colonoscopy. Completed 12/15/18. Repeat every 5 years  Mammogram status: Completed 05/09/22. Repeat every year  Bone Density status: Completed 09/01/16. Results reflect: Bone density results: NORMAL.  Repeat every 5 years. (Scheduled for 12/29/22)  Lung Cancer Screening: (Low Dose CT Chest recommended if Age 65-80 years, 30 pack-year currently smoking OR have quit w/in 15years.) does not qualify.   Lung Cancer Screening Referral: n/a  Additional Screening:  Hepatitis C Screening: does qualify; Completed 05/04/18  Vision Screening: Recommended annual ophthalmology exams for early detection of glaucoma and other disorders of the eye. Is the patient up to date with their annual eye exam?  {YES/NO:21197} Who is the provider or what is the name of the office in which the patient attends annual eye exams? *** If pt is not established with a provider, would they like to be referred to a provider to establish care? {YES/NO:21197}.   Dental Screening: Recommended annual dental exams for proper oral hygiene  Community Resource Referral / Chronic Care Management: CRR required this visit?  {YES/NO:21197}  CCM required this visit?  {YES/NO:21197}     Plan:     I have personally reviewed and noted the following in the patient's chart:   Medical and social history Use of alcohol, tobacco or illicit drugs  Current medications and supplements including opioid prescriptions. {Opioid Prescriptions:[phone]} Functional ability and status Nutritional status Physical activity Advanced directives List of other physicians Hospitalizations, surgeries, and ER visits in previous 12 months Vitals Screenings to include cognitive, depression, and falls Referrals and appointments  In addition, I have reviewed and discussed with patient certain preventive protocols, quality metrics, and best practice recommendations. A written personalized care plan for preventive services as well as general preventive health recommendations were provided to patient.     Durwin Nora, [place]   1/61/0960   Due to this being a virtual visit, the after visit summary with patients personalized plan was  offered to patient via mail or my-chart. ***Patient declined at this time./ Patient would like to access on my-chart/ per request, patient was mailed a copy of AVS./ Patient preferred to pick up at office at next visit  Nurse Notes: ***

## 2022-06-19 ENCOUNTER — Ambulatory Visit: Payer: Medicare HMO

## 2022-06-19 ENCOUNTER — Telehealth: Payer: Self-pay

## 2022-06-19 VITALS — Ht 64.0 in | Wt 251.0 lb

## 2022-06-19 DIAGNOSIS — Z0000 Encounter for general adult medical examination without abnormal findings: Secondary | ICD-10-CM | POA: Diagnosis not present

## 2022-06-19 NOTE — Telephone Encounter (Signed)
Patient would like to know if there are any alternatives to Tylenol that she can take for back pain.

## 2022-06-20 ENCOUNTER — Other Ambulatory Visit: Payer: Self-pay | Admitting: Family Medicine

## 2022-06-20 MED ORDER — TRAMADOL HCL 50 MG PO TABS: 50.0000 mg | ORAL_TABLET | Freq: Three times a day (TID) | ORAL | 0 refills | Status: AC | PRN

## 2022-07-02 ENCOUNTER — Ambulatory Visit (HOSPITAL_BASED_OUTPATIENT_CLINIC_OR_DEPARTMENT_OTHER): Payer: Medicare HMO | Attending: Family Medicine | Admitting: Physical Therapy

## 2022-07-02 ENCOUNTER — Other Ambulatory Visit: Payer: Self-pay

## 2022-07-02 DIAGNOSIS — M5459 Other low back pain: Secondary | ICD-10-CM | POA: Diagnosis not present

## 2022-07-02 DIAGNOSIS — M48061 Spinal stenosis, lumbar region without neurogenic claudication: Secondary | ICD-10-CM | POA: Insufficient documentation

## 2022-07-02 DIAGNOSIS — M62838 Other muscle spasm: Secondary | ICD-10-CM | POA: Insufficient documentation

## 2022-07-02 DIAGNOSIS — R2689 Other abnormalities of gait and mobility: Secondary | ICD-10-CM | POA: Insufficient documentation

## 2022-07-02 NOTE — Therapy (Signed)
OUTPATIENT PHYSICAL THERAPY THORACOLUMBAR EVALUATION   Patient Name: [name] MRN: [id number] DOB:[birth year], 77 y.o., female Today's Date: 07/02/2022  END OF SESSION:  PT End of Session - 07/07/22 1135     Visit Number 1    Number of Visits 16    Date for PT Re-Evaluation 09/01/22    PT Start Time 0845    PT Stop Time 0928    PT Time Calculation (min) 43 min    Activity Tolerance Patient tolerated treatment well    Behavior During Therapy [facility] for tasks assessed/performed             Past Medical History:  Diagnosis Date   Allergy    Arthritis    arthritis -knees   Colon polyps    benign- last colonoscopy 09-2008   Depression    GERD (gastroesophageal reflux disease)    hx GERD   H/O seasonal allergies    no problems now   Hyperlipidemia    Hypertension    Murmur, heart    Spinal stenosis of lumbar region at multiple levels    Past Surgical History:  Procedure Laterality Date   ABDOMINAL HYSTERECTOMY     APPENDECTOMY     with gallbladder surgery   BUNIONECTOMY Bilateral    CHOLECYSTECTOMY     open-gallstones   COLONOSCOPY     EXCISION OF ADNEXAL MASS Left August 03, 2013   at [facility]/right adnexal nodule,excision;benigh lymph node   OOPHORECTOMY Bilateral 2008   ovarian cyst   POLYPECTOMY     Patient Active Problem List   Diagnosis Date Noted   Pincer nail deformity 04/16/2021   Diverticulitis 12/27/2020   History of hysterectomy 12/27/2020   Morbid obesity (HCC) 03/23/2020   Heart failure with preserved ejection fraction (HCC) 12/27/2019   Bilateral knee pain 02/15/2019   Spinal stenosis of lumbar region at multiple levels    Arthropathia 09/23/2013   Benign neoplasm of colon 09/23/2013   Essential hypertension 09/23/2013   SBO (small bowel obstruction) (HCC) 08/17/2013   Pelvic mass 07/05/2013   Pain in joint, pelvic region and thigh 07/05/2013   Hyperlipidemia    Arthritis    Colon polyps     PCP: Cydney Ok   REFERRING  PROVIDER: Donita Brooks   REFERRING DIAG:  Diagnosis  M48.061 (ICD-10-CM) - Spinal stenosis of lumbar region at multiple levels    Rationale for Evaluation and Treatment: Rehabilitation  THERAPY DIAG:  Other low back pain  Other muscle spasm  Other abnormalities of gait and mobility  ONSET DATE:  Several years but increasing recently   SUBJECTIVE:  SUBJECTIVE STATEMENT: The patient has a long history of lower back pain. The pain increases when she stands. It will resolve when she sits. She feels stiff at night when she sleeps. She has bone on bone arthritis in the left knee. She feels like she can stand for about 10 min before she needs to sit.   PERTINENT HISTORY:  Left Knee OA, HTN, Bilateral bunioectomy,  Depression   PAIN:  Are you having pain? Yes: NPRS scale: 0/10 sitting right now. A 5/10 before walking in here Pain location: Low back mostly on the right side  Pain description: aching  Aggravating factors: standing, walking  Relieving factors: sitting down, patches but can irritate her back; Voltaren, bio-freeze   PRECAUTIONS: None  WEIGHT BEARING RESTRICTIONS: No  FALLS:  Has patient fallen in last 6 months? No  LIVING ENVIRONMENT: Has steps into the house. Has difficulty  OCCUPATION:  Retired   Hobbies:   Would like to be able to walk more   PLOF: Independent  PATIENT GOALS:  To have less pain and be able to play with her grandkids   NEXT MD VISIT: Nothing scheduled.   Would like to be able to be able to walk in the store and walk more in general    OBJECTIVE:   DIAGNOSTIC FINDINGS:  2020 MRI of the lumbar spine   IMPRESSION: Multilevel severe congenital and acquired stenosis, L2-3, L3-4, L4-5, and L5-S1 as described.   All could contribute to low back  pain with BILATERAL radicular symptoms, although stenosis appears most severe at L4-5 due to superimposed 5 mm anterolisthesis. Dynamic instability, if present, could worsen impingement at this level.   In addition, predominant RIGHT-sided symptoms are more likely at L3-4 where a suspected extraforaminal protrusion and RIGHT-sided intraspinal, possible extra-spinal, synovial cysts are observed. See discussion above.  PATIENT SURVEYS:  FOTO    SCREENING FOR RED FLAGS: Bowel or bladder incontinence: No Spinal tumors: No Cauda equina syndrome: No Compression fracture: No Abdominal aneurysm: No  COGNITION: Overall cognitive status: Within functional limits for tasks assessed     SENSATION: Numbness and tingling in hands and feet.   MUSCLE LENGTH:  POSTURE: rounded shoulders, forward head, and posterior pelvic tilt sits in PPT   PALPATION:   LUMBAR ROM:   AROM eval  Flexion WNL   Extension Painful and limited   Right lateral flexion   Left lateral flexion   Right rotation Less pain   Left rotation Pain on the right    (Blank rows = not tested)  LOWER EXTREMITY ROM:     MMT  Right eval Left eval  Hip flexion 16.4 18.0  Hip extension    Hip abduction 22.1 22.4  Hip adduction    Hip internal rotation    Hip external rotation    Knee flexion    Knee extension 21.0 22.8  Ankle dorsiflexion    Ankle plantarflexion    Ankle inversion    Ankle eversion     (Blank rows = not tested)  LOWER EXTREMITY MMT:    PROM  Right eval Left eval  Hip flexion Pain at end range  No pain   Hip extension    Hip abduction    Hip adduction    Hip internal rotation    Hip external rotation    Knee flexion  Pain at end range   Knee extension    Ankle dorsiflexion    Ankle plantarflexion    Ankle inversion    Ankle  eversion     (Blank rows = not tested)  LUMBAR SPECIAL TESTS:   FUNCTIONAL TESTS:  Uses hands to stand from a chair  GAIT: Using a cane 2nd to her knee;  lateral movement with gait; decreased hip flexion  TODAY'S TREATMENT:                                                                                                                              DATE:   Access Code: [email or web address] URL: [email or web address] Date: 07/07/2022 Prepared by: Lorayne Bender  Exercises - Supine Lower Trunk Rotation  - 1 x daily - 7 x weekly - 3 sets - 10 reps - Seated Bilateral Shoulder Flexion Towel Slide at Table Top  - 1 x daily - 7 x weekly - 3 sets - 10 reps - Theracane Over Shoulder  - 1 x daily - 7 x weekly - 3 sets - 10 reps   PATIENT EDUCATION:  Education details: HEP, symptom management Person educated: Patient Education method: Explanation, Demonstration, Tactile cues, Verbal cues, and Handouts Education comprehension: verbalized understanding, returned demonstration, verbal cues required, tactile cues required, and needs further education  HOME EXERCISE PROGRAM: Access Code: [email or web address] URL: [email or web address] Date: 07/07/2022 Prepared by: Lorayne Bender  Exercises - Supine Lower Trunk Rotation  - 1 x daily - 7 x weekly - 3 sets - 10 reps - Seated Bilateral Shoulder Flexion Towel Slide at Table Top  - 1 x daily - 7 x weekly - 3 sets - 10 reps - Theracane Over Shoulder  - 1 x daily - 7 x weekly - 3 sets - 10 reps  ASSESSMENT:  CLINICAL IMPRESSION: Patient is a 77 year old female who presents with long history of chronic lower back pain on the right side.  She has increased pain with standing.  At this point she can stand less than 10 minutes before having to a significant increase in low back pain.  She is also having back pain with standing and walking.  She has improved pain with flexion and increased pain with extension and left rotation.  Signs and symptoms are consistent with diagnosis of lumbar stenosis.  Her MRI from 2020 also shows significant stenosis.  She has limited hip flexion on the right side compared to the  left.  She has bilateral gluteal and quad weakness.  She would benefit from skilled therapy to improve her ability to stand and ambulate in the community.  She may benefit from aquatic therapy initially to offload her back while performing strengthening exercises.  OBJECTIVE IMPAIRMENTS: Abnormal gait, decreased activity tolerance, decreased mobility, difficulty walking, decreased ROM, decreased strength, increased muscle spasms, improper body mechanics, and pain.   ACTIVITY LIMITATIONS: carrying, lifting, bending, standing, squatting, stairs, transfers, and locomotion level  PARTICIPATION LIMITATIONS: meal prep, cleaning, laundry, shopping, and community activity  PERSONAL FACTORS: 1-2 comorbidities: severe left knee OA, long standing multi level degeneration of the spine   are also affecting patient's functional outcome.  REHAB POTENTIAL: good   CLINICAL DECISION MAKING: Evolving/moderate complexity  EVALUATION COMPLEXITY: Moderate   GOALS: Goals reviewed with patient? Yes  SHORT TERM GOALS: Target date:   Patient will increase gross bilateral lower extremity strength by 5 pounds Baseline: Goal status: INITIAL  2.  Patient will patient will transfer sit to stand without increased use of hands Baseline:  Goal status: INITIAL  3.  Patient will initiate ambulation without having to stand for several seconds Baseline:  Goal status: INITIAL  4.  Patient will be independent with basic HEP Baseline:  Goal status: INITIAL  LONG TERM GOALS: Target date: 09/01/2022    Patient will go up and down 8 steps comfortably  Baseline:  Goal status: INITIAL  2.  Patient will stand greater than 20 minutes without increased low back pain in order to perform ADLs Baseline:  Goal status: INITIAL  3.  Patient will ambulate in the grocery store without increased low back pain Baseline:  Goal status: INITIAL  PLAN:  PT FREQUENCY: 2x/week  PT DURATION: 8 weeks  PLANNED INTERVENTIONS:  Therapeutic exercises, Therapeutic activity, Neuromuscular re-education, Balance training, Gait training, Patient/Family education, Self Care, Joint mobilization, DME instructions, Aquatic Therapy, Dry Needling, Cryotherapy, Moist heat, Taping, and Manual therapy.  PLAN FOR NEXT SESSION: Aquatic: Begin with basic hip strengthening, core stability, and building standing endurance.  Be mindful of severe left knee OA.  Her gait from her OA is likely exacerbating her low back pain.  Land: Consider soft tissue mobilization to right paraspinals and right upper gluteal.  Consider seated postural series either upper extremity or lower extremity but be aware of left knee OA.  Progressed to standing series as tolerated.  Modalities as needed   Dessie Coma, PT 07/07/2022, 12:04 PM

## 2022-07-03 ENCOUNTER — Telehealth: Payer: Self-pay | Admitting: Family Medicine

## 2022-07-03 DIAGNOSIS — Z6841 Body Mass Index (BMI) 40.0 and over, adult: Secondary | ICD-10-CM | POA: Diagnosis not present

## 2022-07-03 DIAGNOSIS — M5416 Radiculopathy, lumbar region: Secondary | ICD-10-CM | POA: Diagnosis not present

## 2022-07-03 NOTE — Telephone Encounter (Signed)
Patient called to follow up on PA for medication for back pain; patient unsure of the name of the medication.  Requesting call back with status update.  Please advise at [phone].

## 2022-07-04 NOTE — Telephone Encounter (Signed)
Patient returned call. Please advise at [phone].

## 2022-07-07 ENCOUNTER — Encounter (HOSPITAL_BASED_OUTPATIENT_CLINIC_OR_DEPARTMENT_OTHER): Payer: Self-pay | Admitting: Physical Therapy

## 2022-07-09 ENCOUNTER — Encounter (HOSPITAL_BASED_OUTPATIENT_CLINIC_OR_DEPARTMENT_OTHER): Payer: Self-pay | Admitting: Physical Therapy

## 2022-07-09 ENCOUNTER — Ambulatory Visit (HOSPITAL_BASED_OUTPATIENT_CLINIC_OR_DEPARTMENT_OTHER): Payer: Medicare HMO | Admitting: Physical Therapy

## 2022-07-09 DIAGNOSIS — M5459 Other low back pain: Secondary | ICD-10-CM | POA: Diagnosis not present

## 2022-07-09 DIAGNOSIS — M48061 Spinal stenosis, lumbar region without neurogenic claudication: Secondary | ICD-10-CM | POA: Diagnosis not present

## 2022-07-09 DIAGNOSIS — R2689 Other abnormalities of gait and mobility: Secondary | ICD-10-CM | POA: Diagnosis not present

## 2022-07-09 DIAGNOSIS — M62838 Other muscle spasm: Secondary | ICD-10-CM | POA: Diagnosis not present

## 2022-07-09 NOTE — Therapy (Signed)
OUTPATIENT PHYSICAL THERAPY THORACOLUMBAR TREATMENT   Patient Name: [name] MRN: [id number] DOB:1945/06/03, 77 y.o., female Today's Date: 07/02/2022  END OF SESSION:  PT End of Session - 07/09/22 1304     Visit Number 2    Number of Visits 16    Date for PT Re-Evaluation 09/01/22    PT Start Time 1200    PT Stop Time 1241    PT Time Calculation (min) 41 min    Activity Tolerance Patient tolerated treatment well    Behavior During Therapy WFL for tasks assessed/performed              Past Medical History:  Diagnosis Date   Allergy    Arthritis    arthritis -knees   Colon polyps    benign- last colonoscopy 09-2008   Depression    GERD (gastroesophageal reflux disease)    hx GERD   H/O seasonal allergies    no problems now   Hyperlipidemia    Hypertension    Murmur, heart    Spinal stenosis of lumbar region at multiple levels    Past Surgical History:  Procedure Laterality Date   ABDOMINAL HYSTERECTOMY     APPENDECTOMY     with gallbladder surgery   BUNIONECTOMY Bilateral    CHOLECYSTECTOMY     open-gallstones   COLONOSCOPY     EXCISION OF ADNEXAL MASS Left August 03, 2013   at [facility]/right adnexal nodule,excision;benigh lymph node   OOPHORECTOMY Bilateral 2008   ovarian cyst   POLYPECTOMY     Patient Active Problem List   Diagnosis Date Noted   Pincer nail deformity 04/16/2021   Diverticulitis 12/27/2020   History of hysterectomy 12/27/2020   Morbid obesity (HCC) 03/23/2020   Heart failure with preserved ejection fraction (HCC) 12/27/2019   Bilateral knee pain 02/15/2019   Spinal stenosis of lumbar region at multiple levels    Arthropathia 09/23/2013   Benign neoplasm of colon 09/23/2013   Essential hypertension 09/23/2013   SBO (small bowel obstruction) (HCC) 08/17/2013   Pelvic mass 07/05/2013   Pain in joint, pelvic region and thigh 07/05/2013   Hyperlipidemia    Arthritis    Colon polyps     PCP: Cydney Ok   REFERRING  PROVIDER: Donita Brooks   REFERRING DIAG:  Diagnosis  M48.061 (ICD-10-CM) - Spinal stenosis of lumbar region at multiple levels    Rationale for Evaluation and Treatment: Rehabilitation  THERAPY DIAG:  Other low back pain  Other muscle spasm  Other abnormalities of gait and mobility  ONSET DATE:  Several years but increasing recently   SUBJECTIVE:  SUBJECTIVE STATEMENT: Pt reports that she took some tylenol and used biofreeze prior to arrival.  "Pain is not too bad when I sitting, but when I try to vacuum.." Pt reports she has to have a chair nearby and use a cane while vacuuming.   Pt reports she did water aerobics at [facility] prior to the pandemic, but the [place] center only has 1 rail to enter/exit water and it was too painful/difficult to get out of water afterwards.     PERTINENT HISTORY:  Left Knee OA, HTN, Bilateral bunioectomy,  Depression   PAIN:  Are you having pain? Yes: NPRS scale: 0/10 sitting. 5/10 when walking Pain location: Low back mostly on the right side  Pain description: aching  Aggravating factors: standing, walking  Relieving factors: sitting down, patches but can irritate her back; Voltaren, bio-freeze   PRECAUTIONS: None  WEIGHT BEARING RESTRICTIONS: No  FALLS:  Has patient fallen in last 6 months? No  LIVING ENVIRONMENT: Has steps into the house. Has difficulty  OCCUPATION:  Retired   Hobbies:   Would like to be able to walk more   PLOF: Independent  PATIENT GOALS:  To have less pain and be able to play with her grandkids   NEXT MD VISIT: Nothing scheduled.   Would like to be able to be able to walk in the store and walk more in general    OBJECTIVE:   DIAGNOSTIC FINDINGS:  2020 MRI of the lumbar spine   IMPRESSION: Multilevel severe  congenital and acquired stenosis, L2-3, L3-4, L4-5, and L5-S1 as described.   All could contribute to low back pain with BILATERAL radicular symptoms, although stenosis appears most severe at L4-5 due to superimposed 5 mm anterolisthesis. Dynamic instability, if present, could worsen impingement at this level.   In addition, predominant RIGHT-sided symptoms are more likely at L3-4 where a suspected extraforaminal protrusion and RIGHT-sided intraspinal, possible extra-spinal, synovial cysts are observed. See discussion above.  PATIENT SURVEYS:  FOTO    SCREENING FOR RED FLAGS: Bowel or bladder incontinence: No Spinal tumors: No Cauda equina syndrome: No Compression fracture: No Abdominal aneurysm: No  COGNITION: Overall cognitive status: Within functional limits for tasks assessed     SENSATION: Numbness and tingling in hands and feet.   MUSCLE LENGTH:  POSTURE: rounded shoulders, forward head, and posterior pelvic tilt sits in PPT   PALPATION:   LUMBAR ROM:   AROM eval  Flexion WNL   Extension Painful and limited   Right lateral flexion   Left lateral flexion   Right rotation Less pain   Left rotation Pain on the right    (Blank rows = not tested)  LOWER EXTREMITY ROM:     MMT  Right eval Left eval  Hip flexion 16.4 18.0  Hip extension    Hip abduction 22.1 22.4  Hip adduction    Hip internal rotation    Hip external rotation    Knee flexion    Knee extension 21.0 22.8  Ankle dorsiflexion    Ankle plantarflexion    Ankle inversion    Ankle eversion     (Blank rows = not tested)  LOWER EXTREMITY MMT:    PROM  Right eval Left eval  Hip flexion Pain at end range  No pain   Hip extension    Hip abduction    Hip adduction    Hip internal rotation    Hip external rotation    Knee flexion  Pain at end range  Knee extension    Ankle dorsiflexion    Ankle plantarflexion    Ankle inversion    Ankle eversion     (Blank rows = not  tested)  LUMBAR SPECIAL TESTS:   FUNCTIONAL TESTS:  Uses hands to stand from a chair  GAIT: Using a cane 2nd to her knee; lateral movement with gait; decreased hip flexion  TODAY'S TREATMENT:                                                                                                                              DATE: 07/09/22 Pt seen for aquatic therapy today.  Treatment took place in water 3.5-4.75 ft in depth at the [facility] pool. Temp of water was 91.  Pt entered/exited the pool via stairs in step-to pattern (using RLE) with bilat rail. * holding onto wall:  side stepping 10 ft R/L x 4 * holding onto barbell near wall: walking forward/ backward 10 ft, multiple reps * Holding wall:  heel/toe raises x 10;  hip abdct/ addct 3 x5 ;  marching x 10;  relaxed vertical squats x 5;  hip openers/ crosses x 5 each;  alternating single leg clams x 5 each * walking next to wall holding blue hand floats - to the bench in water. * seated on bench- cycling LEs,  hip abdct/ addct (long sitting); alternating LAQ;  STS with feet on blue step and LUE on wall x 3  Pt requires the buoyancy and hydrostatic pressure of water for support, and to offload joints by unweighting joint load by at least 50 % in navel deep water and by at least 75-80% in chest to neck deep water.  Viscosity of the water is needed for resistance of strengthening. Water current perturbations provides challenge to standing balance requiring increased core activation.      PATIENT EDUCATION:  Education details: intro to aquatic therapy  Person educated: Patient Education method: Explanation, Demonstration, Tactile cues, Verbal cues, Education comprehension: verbalized understanding, returned demonstration, verbal cues required, tactile cues required, and needs further education  HOME EXERCISE PROGRAM: Access Code: [email or web address] URL: [email or web address] Date: 07/07/2022 Prepared by: Lorayne Bender  Exercises - Supine Lower Trunk Rotation  - 1 x daily - 7 x weekly - 3 sets - 10 reps - Seated Bilateral Shoulder Flexion Towel Slide at Table Top  - 1 x daily - 7 x weekly - 3 sets - 10 reps - Theracane Over Shoulder  - 1 x daily - 7 x weekly - 3 sets - 10 reps  ASSESSMENT:  CLINICAL IMPRESSION: Pt guarded and appearing fearful while moving in the water. She requires UE support on wall or floatation device near wall today.  Because she has done water aerobics in the past, anticipate she will regain confidence in water gradually over next few visits.  She required frequent cues for more upright trunk vs forward flexed at hips.  She reported reduction of back pain to  0 when submerged ~70%.   She would benefit from skilled therapy to improve her ability to stand and ambulate in the community.  She may benefit from aquatic therapy initially to offload her back while performing strengthening exercises.  OBJECTIVE IMPAIRMENTS: Abnormal gait, decreased activity tolerance, decreased mobility, difficulty walking, decreased ROM, decreased strength, increased muscle spasms, improper body mechanics, and pain.   ACTIVITY LIMITATIONS: carrying, lifting, bending, standing, squatting, stairs, transfers, and locomotion level  PARTICIPATION LIMITATIONS: meal prep, cleaning, laundry, shopping, and community activity  PERSONAL FACTORS: 1-2 comorbidities: severe left knee OA, long standing multi level degeneration of the spine   are also affecting patient's functional outcome.   REHAB POTENTIAL: good   CLINICAL DECISION MAKING: Evolving/moderate complexity  EVALUATION COMPLEXITY: Moderate   GOALS: Goals reviewed with patient? Yes  SHORT TERM GOALS: Target date:   Patient will increase gross bilateral lower extremity strength by 5 pounds Baseline: Goal status: INITIAL  2.  Patient will patient will transfer sit to stand without increased use of hands Baseline:  Goal status: INITIAL  3.   Patient will initiate ambulation without having to stand for several seconds Baseline:  Goal status: INITIAL  4.  Patient will be independent with basic HEP Baseline:  Goal status: INITIAL  LONG TERM GOALS: Target date: 09/01/2022    Patient will go up and down 8 steps comfortably  Baseline:  Goal status: INITIAL  2.  Patient will stand greater than 20 minutes without increased low back pain in order to perform ADLs Baseline:  Goal status: INITIAL  3.  Patient will ambulate in the grocery store without increased low back pain Baseline:  Goal status: INITIAL  PLAN:  PT FREQUENCY: 2x/week  PT DURATION: 8 weeks  PLANNED INTERVENTIONS: Therapeutic exercises, Therapeutic activity, Neuromuscular re-education, Balance training, Gait training, Patient/Family education, Self Care, Joint mobilization, DME instructions, Aquatic Therapy, Dry Needling, Cryotherapy, Moist heat, Taping, and Manual therapy.  PLAN FOR NEXT SESSION: Aquatic: Begin with basic hip strengthening, core stability, and building standing endurance.  Be mindful of severe left knee OA.  Her gait from her OA is likely exacerbating her low back pain.  Land: Consider soft tissue mobilization to right paraspinals and right upper gluteal.  Consider seated postural series either upper extremity or lower extremity but be aware of left knee OA.  Progressed to standing series as tolerated.  Modalities as needed  Mayer Camel, PTA 07/09/22 1:16 PM [facility] [address] [place], [place], [address] Phone: [phone]   Fax:  [phone]

## 2022-07-14 ENCOUNTER — Ambulatory Visit (HOSPITAL_BASED_OUTPATIENT_CLINIC_OR_DEPARTMENT_OTHER): Payer: Medicare HMO | Admitting: Physical Therapy

## 2022-07-14 DIAGNOSIS — R2689 Other abnormalities of gait and mobility: Secondary | ICD-10-CM | POA: Diagnosis not present

## 2022-07-14 DIAGNOSIS — M62838 Other muscle spasm: Secondary | ICD-10-CM | POA: Diagnosis not present

## 2022-07-14 DIAGNOSIS — M5459 Other low back pain: Secondary | ICD-10-CM | POA: Diagnosis not present

## 2022-07-14 DIAGNOSIS — M48061 Spinal stenosis, lumbar region without neurogenic claudication: Secondary | ICD-10-CM | POA: Diagnosis not present

## 2022-07-14 NOTE — Therapy (Signed)
OUTPATIENT PHYSICAL THERAPY THORACOLUMBAR TREATMENT   Patient Name: [name] MRN: [id number] DOB:1945-06-22, 77 y.o., female Today's Date: 07/02/2022  END OF SESSION:  PT End of Session - 07/15/22 0806     Visit Number 3    Number of Visits 16    Date for PT Re-Evaluation 09/01/22    PT Start Time 1230    PT Stop Time 1313    PT Time Calculation (min) 43 min    Activity Tolerance Patient tolerated treatment well    Behavior During Therapy WFL for tasks assessed/performed              Past Medical History:  Diagnosis Date   Allergy    Arthritis    arthritis -knees   Colon polyps    benign- last colonoscopy 09-2008   Depression    GERD (gastroesophageal reflux disease)    hx GERD   H/O seasonal allergies    no problems now   Hyperlipidemia    Hypertension    Murmur, heart    Spinal stenosis of lumbar region at multiple levels    Past Surgical History:  Procedure Laterality Date   ABDOMINAL HYSTERECTOMY     APPENDECTOMY     with gallbladder surgery   BUNIONECTOMY Bilateral    CHOLECYSTECTOMY     open-gallstones   COLONOSCOPY     EXCISION OF ADNEXAL MASS Left August 03, 2013   at [facility]/right adnexal nodule,excision;benigh lymph node   OOPHORECTOMY Bilateral 2008   ovarian cyst   POLYPECTOMY     Patient Active Problem List   Diagnosis Date Noted   Pincer nail deformity 04/16/2021   Diverticulitis 12/27/2020   History of hysterectomy 12/27/2020   Morbid obesity (HCC) 03/23/2020   Heart failure with preserved ejection fraction (HCC) 12/27/2019   Bilateral knee pain 02/15/2019   Spinal stenosis of lumbar region at multiple levels    Arthropathia 09/23/2013   Benign neoplasm of colon 09/23/2013   Essential hypertension 09/23/2013   SBO (small bowel obstruction) (HCC) 08/17/2013   Pelvic mass 07/05/2013   Pain in joint, pelvic region and thigh 07/05/2013   Hyperlipidemia    Arthritis    Colon polyps     PCP: Cydney Ok   REFERRING  PROVIDER: Donita Brooks   REFERRING DIAG:  Diagnosis  M48.061 (ICD-10-CM) - Spinal stenosis of lumbar region at multiple levels    Rationale for Evaluation and Treatment: Rehabilitation  THERAPY DIAG:  Other low back pain  Other muscle spasm  Other abnormalities of gait and mobility  ONSET DATE:  Several years but increasing recently   SUBJECTIVE:  SUBJECTIVE STATEMENT: The patient reports she tolerated the pool well. Her back and knee are about the same.       PERTINENT HISTORY:  Left Knee OA, HTN, Bilateral bunioectomy,  Depression   PAIN:  Are you having pain? Yes: NPRS scale: 0/10 sitting. 5/10 when walking Pain location: Low back mostly on the right side  Pain description: aching  Aggravating factors: standing, walking  Relieving factors: sitting down, patches but can irritate her back; Voltaren, bio-freeze   PRECAUTIONS: None  WEIGHT BEARING RESTRICTIONS: No  FALLS:  Has patient fallen in last 6 months? No  LIVING ENVIRONMENT: Has steps into the house. Has difficulty  OCCUPATION:  Retired   Hobbies:   Would like to be able to walk more   PLOF: Independent  PATIENT GOALS:  To have less pain and be able to play with her grandkids   NEXT MD VISIT: Nothing scheduled.   Would like to be able to be able to walk in the store and walk more in general    OBJECTIVE:   DIAGNOSTIC FINDINGS:  2020 MRI of the lumbar spine   IMPRESSION: Multilevel severe congenital and acquired stenosis, L2-3, L3-4, L4-5, and L5-S1 as described.   All could contribute to low back pain with BILATERAL radicular symptoms, although stenosis appears most severe at L4-5 due to superimposed 5 mm anterolisthesis. Dynamic instability, if present, could worsen impingement at this level.   In  addition, predominant RIGHT-sided symptoms are more likely at L3-4 where a suspected extraforaminal protrusion and RIGHT-sided intraspinal, possible extra-spinal, synovial cysts are observed. See discussion above.  PATIENT SURVEYS:  FOTO    SCREENING FOR RED FLAGS: Bowel or bladder incontinence: No Spinal tumors: No Cauda equina syndrome: No Compression fracture: No Abdominal aneurysm: No  COGNITION: Overall cognitive status: Within functional limits for tasks assessed     SENSATION: Numbness and tingling in hands and feet.   MUSCLE LENGTH:  POSTURE: rounded shoulders, forward head, and posterior pelvic tilt sits in PPT   PALPATION:   LUMBAR ROM:   AROM eval  Flexion WNL   Extension Painful and limited   Right lateral flexion   Left lateral flexion   Right rotation Less pain   Left rotation Pain on the right    (Blank rows = not tested)  LOWER EXTREMITY ROM:     MMT  Right eval Left eval  Hip flexion 16.4 18.0  Hip extension    Hip abduction 22.1 22.4  Hip adduction    Hip internal rotation    Hip external rotation    Knee flexion    Knee extension 21.0 22.8  Ankle dorsiflexion    Ankle plantarflexion    Ankle inversion    Ankle eversion     (Blank rows = not tested)  LOWER EXTREMITY MMT:    PROM  Right eval Left eval  Hip flexion Pain at end range  No pain   Hip extension    Hip abduction    Hip adduction    Hip internal rotation    Hip external rotation    Knee flexion  Pain at end range   Knee extension    Ankle dorsiflexion    Ankle plantarflexion    Ankle inversion    Ankle eversion     (Blank rows = not tested)  LUMBAR SPECIAL TESTS:   FUNCTIONAL TESTS:  Uses hands to stand from a chair  GAIT: Using a cane 2nd to her knee; lateral movement with gait; decreased  hip flexion  TODAY'S TREATMENT:                                                                                                                              DATE:   6/17  Nu-step with cuing for gentle ROM 5 min L2   Manual: side lying trigger point release to upper right gluteal   Quad set: reviewed for HEP x15 bilateral with pillow  LTR x20   Seated hamstring stretch 3x20 sec hold   Seated hip abduction red 2x10  Seated march 3x10  Seated ball squeeze 3x10   All exercises performed with cuing for posture and abdominal breathing           07/09/22 Pt seen for aquatic therapy today.  Treatment took place in water 3.5-4.75 ft in depth at the [facility] pool. Temp of water was 91.  Pt entered/exited the pool via stairs in step-to pattern (using RLE) with bilat rail. * holding onto wall:  side stepping 10 ft R/L x 4 * holding onto barbell near wall: walking forward/ backward 10 ft, multiple reps * Holding wall:  heel/toe raises x 10;  hip abdct/ addct 3 x5 ;  marching x 10;  relaxed vertical squats x 5;  hip openers/ crosses x 5 each;  alternating single leg clams x 5 each * walking next to wall holding blue hand floats - to the bench in water. * seated on bench- cycling LEs,  hip abdct/ addct (long sitting); alternating LAQ;  STS with feet on blue step and LUE on wall x 3  Pt requires the buoyancy and hydrostatic pressure of water for support, and to offload joints by unweighting joint load by at least 50 % in navel deep water and by at least 75-80% in chest to neck deep water.  Viscosity of the water is needed for resistance of strengthening. Water current perturbations provides challenge to standing balance requiring increased core activation.      PATIENT EDUCATION:  Education details: intro to aquatic therapy  Person educated: Patient Education method: Explanation, Demonstration, Tactile cues, Verbal cues, Education comprehension: verbalized understanding, returned demonstration, verbal cues required, tactile cues required, and needs further education  HOME EXERCISE PROGRAM: Access Code: [email or web address] URL:  [email or web address] Date: 07/07/2022 Prepared by: Lorayne Bender  Exercises - Supine Lower Trunk Rotation  - 1 x daily - 7 x weekly - 3 sets - 10 reps - Seated Bilateral Shoulder Flexion Towel Slide at Table Top  - 1 x daily - 7 x weekly - 3 sets - 10 reps - Theracane Over Shoulder  - 1 x daily - 7 x weekly - 3 sets - 10 reps  ASSESSMENT:  CLINICAL IMPRESSION: The patient tolerated treatment well. We reviewed quad sets and hamstring stretching for her knee. We also reviewed a seated series for core and hip strengthening. She was given an updated HEP. We perfromed manual trigger point release to upper gluteal. She had  a large spasm. Overall she tolerated well. Therapy will continue to progress as tolerated.  OBJECTIVE IMPAIRMENTS: Abnormal gait, decreased activity tolerance, decreased mobility, difficulty walking, decreased ROM, decreased strength, increased muscle spasms, improper body mechanics, and pain.   ACTIVITY LIMITATIONS: carrying, lifting, bending, standing, squatting, stairs, transfers, and locomotion level  PARTICIPATION LIMITATIONS: meal prep, cleaning, laundry, shopping, and community activity  PERSONAL FACTORS: 1-2 comorbidities: severe left knee OA, long standing multi level degeneration of the spine   are also affecting patient's functional outcome.   REHAB POTENTIAL: good   CLINICAL DECISION MAKING: Evolving/moderate complexity  EVALUATION COMPLEXITY: Moderate   GOALS: Goals reviewed with patient? Yes  SHORT TERM GOALS: Target date:   Patient will increase gross bilateral lower extremity strength by 5 pounds Baseline: Goal status: INITIAL  2.  Patient will patient will transfer sit to stand without increased use of hands Baseline:  Goal status: INITIAL  3.  Patient will initiate ambulation without having to stand for several seconds Baseline:  Goal status: INITIAL  4.  Patient will be independent with basic HEP Baseline:  Goal status:  INITIAL  LONG TERM GOALS: Target date: 09/01/2022    Patient will go up and down 8 steps comfortably  Baseline:  Goal status: INITIAL  2.  Patient will stand greater than 20 minutes without increased low back pain in order to perform ADLs Baseline:  Goal status: INITIAL  3.  Patient will ambulate in the grocery store without increased low back pain Baseline:  Goal status: INITIAL  PLAN:  PT FREQUENCY: 2x/week  PT DURATION: 8 weeks  PLANNED INTERVENTIONS: Therapeutic exercises, Therapeutic activity, Neuromuscular re-education, Balance training, Gait training, Patient/Family education, Self Care, Joint mobilization, DME instructions, Aquatic Therapy, Dry Needling, Cryotherapy, Moist heat, Taping, and Manual therapy.  PLAN FOR NEXT SESSION: Aquatic: Begin with basic hip strengthening, core stability, and building standing endurance.  Be mindful of severe left knee OA.  Her gait from her OA is likely exacerbating her low back pain.  Land: Consider soft tissue mobilization to right paraspinals and right upper gluteal.  Consider seated postural series either upper extremity or lower extremity but be aware of left knee OA.  Progressed to standing series as tolerated.  Modalities as needed   Lorayne Bender PT DPT 07/15/22 8:21 AM [facility] [address] [place], [place], [address] Phone: [phone]   Fax:  [phone]

## 2022-07-15 ENCOUNTER — Other Ambulatory Visit: Payer: Self-pay | Admitting: Family Medicine

## 2022-07-15 ENCOUNTER — Encounter (HOSPITAL_BASED_OUTPATIENT_CLINIC_OR_DEPARTMENT_OTHER): Payer: Self-pay | Admitting: Physical Therapy

## 2022-07-15 NOTE — Telephone Encounter (Signed)
Requested Prescriptions  Pending Prescriptions Disp Refills   doxazosin (CARDURA) 2 MG tablet [Pharmacy Med Name: Doxazosin Mesylate 2 MG Oral Tablet] 90 tablet 0    Sig: Take 1 tablet by mouth once daily     Cardiovascular:  Alpha Blockers Failed - 07/15/2022  9:17 AM      Failed - Valid encounter within last 6 months    Recent Outpatient Visits           1 year ago Change in bowel habits   [facility] Pickard, Priscille Heidelberg, MD   1 year ago Ingrown thumb nail, right   [facility] Pickard, Priscille Heidelberg, MD   1 year ago General medical exam   [facility] Donita Brooks, MD   1 year ago Sinus pressure   [facility] Valentino Nose, NP   2 years ago Spinal stenosis of lumbar region at multiple levels   [facility] Pickard, Priscille Heidelberg, MD              Passed - Last BP in normal range    BP Readings from Last 1 Encounters:  05/30/22 126/84

## 2022-07-17 ENCOUNTER — Ambulatory Visit (AMBULATORY_SURGERY_CENTER): Payer: Medicare HMO

## 2022-07-17 ENCOUNTER — Encounter: Payer: Self-pay | Admitting: Internal Medicine

## 2022-07-17 VITALS — Ht 64.0 in | Wt 249.0 lb

## 2022-07-17 DIAGNOSIS — R1031 Right lower quadrant pain: Secondary | ICD-10-CM

## 2022-07-17 DIAGNOSIS — Z86010 Personal history of colonic polyps: Secondary | ICD-10-CM

## 2022-07-17 DIAGNOSIS — Z800 Family history of malignant neoplasm of digestive organs: Secondary | ICD-10-CM

## 2022-07-17 NOTE — Progress Notes (Signed)
No egg or soy allergy known to patient  No issues known to pt with past sedation with any surgeries or procedures Patient denies ever being told they had issues or difficulty with intubation  No FH of Malignant Hyperthermia Pt is not on diet pills Pt is not on  home 02  Pt is not on blood thinners  Pt denies issues with constipation  No A fib or A flutter Have any cardiac testing pending--no Pt instructed to use [email or web address] or GoodRx for a price reduction on prep  Can ambulate with cane

## 2022-07-21 ENCOUNTER — Encounter (HOSPITAL_BASED_OUTPATIENT_CLINIC_OR_DEPARTMENT_OTHER): Payer: Self-pay | Admitting: Physical Therapy

## 2022-07-21 ENCOUNTER — Ambulatory Visit (HOSPITAL_BASED_OUTPATIENT_CLINIC_OR_DEPARTMENT_OTHER): Payer: Medicare HMO | Admitting: Physical Therapy

## 2022-07-21 DIAGNOSIS — R2689 Other abnormalities of gait and mobility: Secondary | ICD-10-CM

## 2022-07-21 DIAGNOSIS — M5459 Other low back pain: Secondary | ICD-10-CM

## 2022-07-21 DIAGNOSIS — M62838 Other muscle spasm: Secondary | ICD-10-CM

## 2022-07-21 DIAGNOSIS — M48061 Spinal stenosis, lumbar region without neurogenic claudication: Secondary | ICD-10-CM | POA: Diagnosis not present

## 2022-07-21 NOTE — Therapy (Addendum)
OUTPATIENT PHYSICAL THERAPY THORACOLUMBAR TREATMENT/discharge    Patient Name: [name] MRN: [id number] DOB:[birth year], 77 y.o., female Today's Date: 07/02/2022  END OF SESSION:  PT End of Session - 07/21/22 1145     Visit Number 4    Number of Visits 16    Date for PT Re-Evaluation 09/01/22    PT Start Time 1146    PT Stop Time 1228    PT Time Calculation (min) 42 min    Activity Tolerance Patient tolerated treatment well    Behavior During Therapy WFL for tasks assessed/performed              Past Medical History:  Diagnosis Date   Allergy    Arthritis    arthritis -knees   Colon polyps    benign- last colonoscopy 09-2008   Depression    GERD (gastroesophageal reflux disease)    hx GERD   H/O seasonal allergies    no problems now   Hyperlipidemia    Hypertension    Murmur, heart    Spinal stenosis of lumbar region at multiple levels    Past Surgical History:  Procedure Laterality Date   ABDOMINAL HYSTERECTOMY     APPENDECTOMY     with gallbladder surgery   BUNIONECTOMY Bilateral    CHOLECYSTECTOMY     open-gallstones   COLONOSCOPY     EXCISION OF ADNEXAL MASS Left August 03, 2013   at [facility]/right adnexal nodule,excision;benigh lymph node   OOPHORECTOMY Bilateral 2008   ovarian cyst   POLYPECTOMY     Patient Active Problem List   Diagnosis Date Noted   Pincer nail deformity 04/16/2021   Diverticulitis 12/27/2020   History of hysterectomy 12/27/2020   Morbid obesity (HCC) 03/23/2020   Heart failure with preserved ejection fraction (HCC) 12/27/2019   Bilateral knee pain 02/15/2019   Spinal stenosis of lumbar region at multiple levels    Arthropathia 09/23/2013   Benign neoplasm of colon 09/23/2013   Essential hypertension 09/23/2013   SBO (small bowel obstruction) (HCC) 08/17/2013   Pelvic mass 07/05/2013   Pain in joint, pelvic region and thigh 07/05/2013   Hyperlipidemia    Arthritis    Colon polyps     PCP: Cydney Ok    REFERRING PROVIDER: Donita Brooks   REFERRING DIAG:  Diagnosis  M48.061 (ICD-10-CM) - Spinal stenosis of lumbar region at multiple levels    Rationale for Evaluation and Treatment: Rehabilitation  THERAPY DIAG:  Other low back pain  Other muscle spasm  Other abnormalities of gait and mobility  ONSET DATE:  Several years but increasing recently   SUBJECTIVE:  SUBJECTIVE STATEMENT: The patient reports she is sore today. She waited too long to take her medicine. She reports she is sore in both knees and low back today. She reported doing well in the pool the last time.  PERTINENT HISTORY:  Left Knee OA, HTN, Bilateral bunioectomy,  Depression   PAIN:  Are you having pain? Yes: NPRS scale: 0/10 sitting. 5/10 when walking Pain location: Low back mostly on the right side  Pain description: aching  Aggravating factors: standing, walking  Relieving factors: sitting down, patches but can irritate her back; Voltaren, bio-freeze   PRECAUTIONS: None  WEIGHT BEARING RESTRICTIONS: No  FALLS:  Has patient fallen in last 6 months? No  LIVING ENVIRONMENT: Has steps into the house. Has difficulty  OCCUPATION:  Retired   Hobbies:   Would like to be able to walk more   PLOF: Independent  PATIENT GOALS:  To have less pain and be able to play with her grandkids   NEXT MD VISIT: Nothing scheduled.   Would like to be able to be able to walk in the store and walk more in general    OBJECTIVE:   DIAGNOSTIC FINDINGS:  2020 MRI of the lumbar spine   IMPRESSION: Multilevel severe congenital and acquired stenosis, L2-3, L3-4, L4-5, and L5-S1 as described.   All could contribute to low back pain with BILATERAL radicular symptoms, although stenosis appears most severe at L4-5 due  to superimposed 5 mm anterolisthesis. Dynamic instability, if present, could worsen impingement at this level.   In addition, predominant RIGHT-sided symptoms are more likely at L3-4 where a suspected extraforaminal protrusion and RIGHT-sided intraspinal, possible extra-spinal, synovial cysts are observed. See discussion above.  PATIENT SURVEYS:  FOTO    SCREENING FOR RED FLAGS: Bowel or bladder incontinence: No Spinal tumors: No Cauda equina syndrome: No Compression fracture: No Abdominal aneurysm: No  COGNITION: Overall cognitive status: Within functional limits for tasks assessed     SENSATION: Numbness and tingling in hands and feet.   MUSCLE LENGTH:  POSTURE: rounded shoulders, forward head, and posterior pelvic tilt sits in PPT   PALPATION:   LUMBAR ROM:   AROM eval  Flexion WNL   Extension Painful and limited   Right lateral flexion   Left lateral flexion   Right rotation Less pain   Left rotation Pain on the right    (Blank rows = not tested)  LOWER EXTREMITY ROM:     MMT  Right eval Left eval  Hip flexion 16.4 18.0  Hip extension    Hip abduction 22.1 22.4  Hip adduction    Hip internal rotation    Hip external rotation    Knee flexion    Knee extension 21.0 22.8  Ankle dorsiflexion    Ankle plantarflexion    Ankle inversion    Ankle eversion     (Blank rows = not tested)  LOWER EXTREMITY MMT:    PROM  Right eval Left eval  Hip flexion Pain at end range  No pain   Hip extension    Hip abduction    Hip adduction    Hip internal rotation    Hip external rotation    Knee flexion  Pain at end range   Knee extension    Ankle dorsiflexion    Ankle plantarflexion    Ankle inversion    Ankle eversion     (Blank rows = not tested)  LUMBAR SPECIAL TESTS:   FUNCTIONAL TESTS:  Uses hands to stand from  a chair  GAIT: Using a cane 2nd to her knee; lateral movement with gait; decreased hip flexion  TODAY'S TREATMENT:                                                                                                                               DATE:  6/24 Nu-step with cuing for gentle ROM 5 min L2  Ball press and hold x20  Ball roll fwd and lateral x10 10 sec each way   Bilateral ER yellow 2x10  Bilateral horizontal abduction 2x10 yellow  Bilateral flexion with abduction 2x10 all with yellow band  Row yellow 2x10 Shoulder extension yellow 2x10           6/17  Nu-step with cuing for gentle ROM 5 min L2   Manual: side lying trigger point release to upper right gluteal   Quad set: reviewed for HEP x15 bilateral with pillow  LTR x20   Seated hamstring stretch 3x20 sec hold   Seated hip abduction red 2x10  Seated march 3x10  Seated ball squeeze 3x10   All exercises performed with cuing for posture and abdominal breathing           07/09/22 Pt seen for aquatic therapy today.  Treatment took place in water 3.5-4.75 ft in depth at the [facility] pool. Temp of water was 91.  Pt entered/exited the pool via stairs in step-to pattern (using RLE) with bilat rail. * holding onto wall:  side stepping 10 ft R/L x 4 * holding onto barbell near wall: walking forward/ backward 10 ft, multiple reps * Holding wall:  heel/toe raises x 10;  hip abdct/ addct 3 x5 ;  marching x 10;  relaxed vertical squats x 5;  hip openers/ crosses x 5 each;  alternating single leg clams x 5 each * walking next to wall holding blue hand floats - to the bench in water. * seated on bench- cycling LEs,  hip abdct/ addct (long sitting); alternating LAQ;  STS with feet on blue step and LUE on wall x 3  Pt requires the buoyancy and hydrostatic pressure of water for support, and to offload joints by unweighting joint load by at least 50 % in navel deep water and by at least 75-80% in chest to neck deep water.  Viscosity of the water is needed for resistance of strengthening. Water current perturbations provides challenge to standing  balance requiring increased core activation.      PATIENT EDUCATION:  Education details: intro to aquatic therapy  Person educated: Patient Education method: Explanation, Demonstration, Tactile cues, Verbal cues, Education comprehension: verbalized understanding, returned demonstration, verbal cues required, tactile cues required, and needs further education  HOME EXERCISE PROGRAM: Access Code: [email or web address] URL: [email or web address] Date: 07/07/2022 Prepared by: Lorayne Bender  Exercises - Supine Lower Trunk Rotation  - 1 x daily - 7 x weekly - 3 sets - 10 reps - Seated Bilateral Shoulder Flexion Towel Slide at Table  Top  - 1 x daily - 7 x weekly - 3 sets - 10 reps - Theracane Over Shoulder  - 1 x daily - 7 x weekly - 3 sets - 10 reps  ASSESSMENT:  CLINICAL IMPRESSION: Therapy reviewed a series of UE core stability exercises. She was advised at home she can rotate between upper and lower back. She was also given her first series from a standing position. She had minor pain with her standing exercises. We updated and reviewed her HEP.   OBJECTIVE IMPAIRMENTS: Abnormal gait, decreased activity tolerance, decreased mobility, difficulty walking, decreased ROM, decreased strength, increased muscle spasms, improper body mechanics, and pain.   ACTIVITY LIMITATIONS: carrying, lifting, bending, standing, squatting, stairs, transfers, and locomotion level  PARTICIPATION LIMITATIONS: meal prep, cleaning, laundry, shopping, and community activity  PERSONAL FACTORS: 1-2 comorbidities: severe left knee OA, long standing multi level degeneration of the spine   are also affecting patient's functional outcome.   REHAB POTENTIAL: good   CLINICAL DECISION MAKING: Evolving/moderate complexity  EVALUATION COMPLEXITY: Moderate   GOALS: Goals reviewed with patient? Yes  SHORT TERM GOALS: Target date:   Patient will increase gross bilateral lower extremity strength by 5  pounds Baseline: Goal status: INITIAL  2.  Patient will patient will transfer sit to stand without increased use of hands Baseline:  Goal status: INITIAL  3.  Patient will initiate ambulation without having to stand for several seconds Baseline:  Goal status: INITIAL  4.  Patient will be independent with basic HEP Baseline:  Goal status: INITIAL  LONG TERM GOALS: Target date: 09/01/2022    Patient will go up and down 8 steps comfortably  Baseline:  Goal status: INITIAL  2.  Patient will stand greater than 20 minutes without increased low back pain in order to perform ADLs Baseline:  Goal status: INITIAL  3.  Patient will ambulate in the grocery store without increased low back pain Baseline:  Goal status: INITIAL  PLAN:  PT FREQUENCY: 2x/week  PT DURATION: 8 weeks  PLANNED INTERVENTIONS: Therapeutic exercises, Therapeutic activity, Neuromuscular re-education, Balance training, Gait training, Patient/Family education, Self Care, Joint mobilization, DME instructions, Aquatic Therapy, Dry Needling, Cryotherapy, Moist heat, Taping, and Manual therapy.  PLAN FOR NEXT SESSION: Aquatic: Begin with basic hip strengthening, core stability, and building standing endurance.  Be mindful of severe left knee OA.  Her gait from her OA is likely exacerbating her low back pain.  Land: Consider soft tissue mobilization to right paraspinals and right upper gluteal.  Consider seated postural series either upper extremity or lower extremity but be aware of left knee OA.  Progressed to standing series as tolerated.  Modalities as needed   PHYSICAL THERAPY DISCHARGE SUMMARY  Visits from Start of Care: 4  Current functional level related to goals / functional outcomes: Still having pain/ Self discharge    Remaining deficits: Self discharge unknown    Education / Equipment: HEP   Patient agrees to discharge. Patient goals were not met. Patient is being discharged due to meeting the  stated rehab goals.   Lorayne Bender PT DPT 07/21/22 4:15 PM [facility] [address] [place], [place], [address] Phone: [phone]   Fax:  [phone]

## 2022-07-30 ENCOUNTER — Ambulatory Visit (HOSPITAL_BASED_OUTPATIENT_CLINIC_OR_DEPARTMENT_OTHER): Payer: Medicare HMO | Attending: Family Medicine | Admitting: Physical Therapy

## 2022-07-30 DIAGNOSIS — M5459 Other low back pain: Secondary | ICD-10-CM | POA: Insufficient documentation

## 2022-07-30 DIAGNOSIS — R2689 Other abnormalities of gait and mobility: Secondary | ICD-10-CM | POA: Insufficient documentation

## 2022-07-30 DIAGNOSIS — M62838 Other muscle spasm: Secondary | ICD-10-CM | POA: Insufficient documentation

## 2022-07-30 DIAGNOSIS — M48061 Spinal stenosis, lumbar region without neurogenic claudication: Secondary | ICD-10-CM | POA: Insufficient documentation

## 2022-08-07 ENCOUNTER — Encounter: Payer: Self-pay | Admitting: Internal Medicine

## 2022-08-07 ENCOUNTER — Ambulatory Visit (AMBULATORY_SURGERY_CENTER): Payer: Medicare HMO | Admitting: Internal Medicine

## 2022-08-07 VITALS — BP 140/96 | HR 69 | Temp 97.8°F | Resp 16 | Ht 64.0 in | Wt 249.0 lb

## 2022-08-07 DIAGNOSIS — Z86010 Personal history of colonic polyps: Secondary | ICD-10-CM

## 2022-08-07 DIAGNOSIS — Z800 Family history of malignant neoplasm of digestive organs: Secondary | ICD-10-CM | POA: Diagnosis not present

## 2022-08-07 DIAGNOSIS — I10 Essential (primary) hypertension: Secondary | ICD-10-CM | POA: Diagnosis not present

## 2022-08-07 DIAGNOSIS — D124 Benign neoplasm of descending colon: Secondary | ICD-10-CM

## 2022-08-07 DIAGNOSIS — D122 Benign neoplasm of ascending colon: Secondary | ICD-10-CM

## 2022-08-07 DIAGNOSIS — D123 Benign neoplasm of transverse colon: Secondary | ICD-10-CM | POA: Diagnosis not present

## 2022-08-07 DIAGNOSIS — Z09 Encounter for follow-up examination after completed treatment for conditions other than malignant neoplasm: Secondary | ICD-10-CM | POA: Diagnosis not present

## 2022-08-07 DIAGNOSIS — R194 Change in bowel habit: Secondary | ICD-10-CM | POA: Diagnosis not present

## 2022-08-07 MED ORDER — SODIUM CHLORIDE 0.9 % IV SOLN
500.0000 mL | Freq: Once | INTRAVENOUS | Status: DC
Start: 2022-08-07 — End: 2022-08-07

## 2022-08-07 NOTE — Op Note (Addendum)
[facility] Patient Name: [name] Procedure Date: 08/07/2022 7:29 AM MRN: [id number] Endoscopist: Particia Lather , , [id number] Age: 77 Referring MD:  Date of Birth: 18-Oct-1945 Gender: Female Account #: [id number] Procedure:                Colonoscopy Indications:              Family history of colon cancer in multiple                            first-degree relatives Medicines:                Monitored Anesthesia Care Procedure:                Pre-Anesthesia Assessment:                           - Prior to the procedure, a History and Physical                            was performed, and patient medications and                            allergies were reviewed. The patient's tolerance of                            previous anesthesia was also reviewed. The risks                            and benefits of the procedure and the sedation                            options and risks were discussed with the patient.                            All questions were answered, and informed consent                            was obtained. Prior Anticoagulants: The patient has                            taken no anticoagulant or antiplatelet agents                            except for aspirin. ASA Grade Assessment: II - A                            patient with mild systemic disease. After reviewing                            the risks and benefits, the patient was deemed in                            satisfactory condition to undergo the procedure.  After obtaining informed consent, the colonoscope                            was passed under direct vision. Throughout the                            procedure, the patient's blood pressure, pulse, and                            oxygen saturations were monitored continuously. The                            CF [id number] #[id number] was introduced through the anus                            and advanced to the  the cecum, identified by                            appendiceal orifice and ileocecal valve. The                            colonoscopy was performed without difficulty. The                            patient tolerated the procedure well. The quality                            of the bowel preparation was good. The ileocecal                            valve, appendiceal orifice, and rectum were                            photographed. Scope In: 8:21:42 AM Scope Out: 8:48:23 AM Scope Withdrawal Time: 0 hours 20 minutes 17 seconds  Total Procedure Duration: 0 hours 26 minutes 41 seconds  Findings:                 Multiple diverticula were found in the sigmoid                            colon, descending colon and ascending colon.                           Five sessile polyps were found in the transverse                            colon and ascending colon. The polyps were 3 to 5                            mm in size. These polyps were removed with a cold                            snare. Resection and retrieval were complete.  A 4 mm polyp was found in the descending colon. The                            polyp was sessile. The polyp was removed with a                            cold snare. Resection and retrieval were complete.                           Non-bleeding internal hemorrhoids were found during                            retroflexion. Complications:            No immediate complications. Estimated Blood Loss:     Estimated blood loss was minimal. Impression:               - Diverticulosis in the sigmoid colon, in the                            descending colon and in the ascending colon.                           - Five 3 to 5 mm polyps in the transverse colon and                            in the ascending colon, removed with a cold snare.                            Resected and retrieved.                           - One 4 mm polyp in the descending  colon, removed                            with a cold snare. Resected and retrieved.                           - Non-bleeding internal hemorrhoids. Recommendation:           - Discharge patient to home (with escort).                           - Await pathology results.                           - Recommend following up with OB/GYN regarding                            right adnexal complex cystic lesion.                           - The findings and recommendations were discussed  with the patient. Dr Particia Lather "Alan Ripper" Leonides Schanz,  08/07/2022 8:54:33 AM

## 2022-08-07 NOTE — Patient Instructions (Signed)
6 polyps removed and sent to pathology. Resume previous medications.   Await results for final recommendations.  Handouts on findings given to patient.  (Polyps, diverticulosis and hemorrhoids)  YOU HAD AN ENDOSCOPIC PROCEDURE TODAY AT THE [facility]:   Refer to the procedure report that was given to you for any specific questions about what was found during the examination.  If the procedure report does not answer your questions, please call your gastroenterologist to clarify.  If you requested that your care partner not be given the details of your procedure findings, then the procedure report has been included in a sealed envelope for you to review at your convenience later.  YOU SHOULD EXPECT: Some feelings of bloating in the abdomen. Passage of more gas than usual.  Walking can help get rid of the air that was put into your GI tract during the procedure and reduce the bloating. If you had a lower endoscopy (such as a colonoscopy or flexible sigmoidoscopy) you may notice spotting of blood in your stool or on the toilet paper. If you underwent a bowel prep for your procedure, you may not have a normal bowel movement for a few days.  Please Note:  You might notice some irritation and congestion in your nose or some drainage.  This is from the oxygen used during your procedure.  There is no need for concern and it should clear up in a day or so.  SYMPTOMS TO REPORT IMMEDIATELY:  Following lower endoscopy (colonoscopy or flexible sigmoidoscopy):  Excessive amounts of blood in the stool  Significant tenderness or worsening of abdominal pains  Swelling of the abdomen that is new, acute  Fever of 100F or higher  For urgent or emergent issues, a gastroenterologist can be reached at any hour by calling [phone]. Do not use MyChart messaging for urgent concerns.    DIET:  We do recommend a small meal at first, but then you may proceed to your regular diet.  Drink plenty of fluids  but you should avoid alcoholic beverages for 24 hours.  ACTIVITY:  You should plan to take it easy for the rest of today and you should NOT DRIVE or use heavy machinery until tomorrow (because of the sedation medicines used during the test).    FOLLOW UP: Our staff will call the number listed on your records the next business day following your procedure.  We will call around 7:15- 8:00 am to check on you and address any questions or concerns that you may have regarding the information given to you following your procedure. If we do not reach you, we will leave a message.     If any biopsies were taken you will be contacted by phone or by letter within the next 1-3 weeks.  Please call us at [phone] if you have not heard about the biopsies in 3 weeks.    SIGNATURES/CONFIDENTIALITY: You and/or your care partner have signed paperwork which will be entered into your electronic medical record.  These signatures attest to the fact that that the information above on your After Visit Summary has been reviewed and is understood.  Full responsibility of the confidentiality of this discharge information lies with you and/or your care-partner.

## 2022-08-07 NOTE — Progress Notes (Signed)
Called to room to assist during endoscopic procedure.  Patient ID and intended procedure confirmed with present staff. Received instructions for my participation in the procedure from the performing physician.  

## 2022-08-07 NOTE — Progress Notes (Signed)
VS by DT  Pt's states no medical or surgical changes since previsit or office visit.  

## 2022-08-07 NOTE — Progress Notes (Signed)
GASTROENTEROLOGY PROCEDURE H&P NOTE   Primary Care Physician: Donita Brooks, MD    Reason for Procedure:   History of colon polyps, family history of colon cancer, change in bowel habits  Plan:    Colonoscopy  Patient is appropriate for endoscopic procedure(s) in the ambulatory ([facility]) setting.  The nature of the procedure, as well as the risks, benefits, and alternatives were carefully and thoroughly reviewed with the patient. Ample time for discussion and questions allowed. The patient understood, was satisfied, and agreed to proceed.     HPI: [name] is a 77 y.o. female who presents for colonoscopy for evaluation of history of colon polyps, family history of colon cancer, and change in bowel habits.  Patient was most recently seen in the Gastroenterology Clinic on 05/07/22.  No interval change in medical history since that appointment. Please refer to that note for full details regarding GI history and clinical presentation.   Past Medical History:  Diagnosis Date   Allergy    Arthritis    arthritis -knees   Colon polyps    benign- last colonoscopy 09-2008   Depression    GERD (gastroesophageal reflux disease)    hx GERD   H/O seasonal allergies    no problems now   Hyperlipidemia    Hypertension    Murmur, heart    Spinal stenosis of lumbar region at multiple levels     Past Surgical History:  Procedure Laterality Date   ABDOMINAL HYSTERECTOMY     APPENDECTOMY     with gallbladder surgery   BUNIONECTOMY Bilateral    CHOLECYSTECTOMY     open-gallstones   COLONOSCOPY     EXCISION OF ADNEXAL MASS Left August 03, 2013   at [facility]/right adnexal nodule,excision;benigh lymph node   OOPHORECTOMY Bilateral 2008   ovarian cyst   POLYPECTOMY      Prior to Admission medications   Medication Sig Start Date End Date Taking? Authorizing Provider  acetaminophen (TYLENOL) 650 MG CR tablet Take by mouth as needed for pain.   Yes [provider]   albuterol (VENTOLIN HFA) 108 (90 Base) MCG/ACT inhaler INHALE 2 PUFFS BY MOUTH EVERY 4 HOURS AS NEEDED FOR WHEEZING FOR SHORTNESS OF BREATH 01/08/22  Yes Donita Brooks, MD  diltiazem (CARDIZEM CD) 360 MG 24 hr capsule TAKE 1 CAPSULE BY MOUTH ONCE DAILY 06/02/22  Yes Donita Brooks, MD  doxazosin (CARDURA) 2 MG tablet Take 1 tablet by mouth once daily 07/15/22  Yes Donita Brooks, MD  empagliflozin (JARDIANCE) 10 MG TABS tablet Take 1 tablet (10 mg total) by mouth daily before breakfast. 04/16/22  Yes Chandrasekhar, Mahesh A, MD  furosemide (LASIX) 40 MG tablet Take 1.5 tablets (60 mg total) by mouth 2 (two) times daily. 09/25/21 12/25/22 Yes Chandrasekhar, Mahesh A, MD  lisinopril (ZESTRIL) 40 MG tablet Take 1 tablet (40 mg total) by mouth daily. 01/08/22  Yes Chandrasekhar, Mahesh A, MD  loratadine (CLARITIN) 10 MG tablet Take 1 tablet by mouth daily.   Yes [provider]  Multiple Vitamins-Minerals (CENTRUM MINIS WOMEN 50+) TABS Take by mouth daily at 6 (six) AM.   Yes [provider]  omeprazole (PRILOSEC) 40 MG capsule Take 1 capsule by mouth once daily Patient taking differently: as needed (heartburn). 03/25/21  Yes Donita Brooks, MD  Polyethylene Glycol 3350 (MIRALAX PO) Take by mouth as needed.   Yes [provider]  rosuvastatin (CRESTOR) 10 MG tablet Take 1 tablet by mouth once  daily 12/30/21  Yes Chandrasekhar, Mahesh A, MD  fluticasone (FLONASE) 50 MCG/ACT nasal spray Place 2 sprays into both nostrils daily. Patient taking differently: Place 2 sprays into both nostrils as needed for allergies. 08/10/20   Valentino Nose, NP  potassium chloride SA (KLOR-CON M) 20 MEQ tablet Take 1 tablet (20 mEq total) by mouth daily. 05/30/22   Donita Brooks, MD  spironolactone (ALDACTONE) 25 MG tablet Take 0.5 tablets (12.5 mg total) by mouth daily. Patient not taking: Reported on 07/17/2022 01/02/22   Donita Brooks, MD  traMADol (ULTRAM) 50 MG tablet Take 50  mg by mouth every 8 (eight) hours as needed. 07/06/22   [provider]    Current Outpatient Medications  Medication Sig Dispense Refill   acetaminophen (TYLENOL) 650 MG CR tablet Take by mouth as needed for pain.     albuterol (VENTOLIN HFA) 108 (90 Base) MCG/ACT inhaler INHALE 2 PUFFS BY MOUTH EVERY 4 HOURS AS NEEDED FOR WHEEZING FOR SHORTNESS OF BREATH 18 g 0   diltiazem (CARDIZEM CD) 360 MG 24 hr capsule TAKE 1 CAPSULE BY MOUTH ONCE DAILY 90 capsule 1   doxazosin (CARDURA) 2 MG tablet Take 1 tablet by mouth once daily 90 tablet 0   empagliflozin (JARDIANCE) 10 MG TABS tablet Take 1 tablet (10 mg total) by mouth daily before breakfast. 90 tablet 2   furosemide (LASIX) 40 MG tablet Take 1.5 tablets (60 mg total) by mouth 2 (two) times daily. 270 tablet 3   lisinopril (ZESTRIL) 40 MG tablet Take 1 tablet (40 mg total) by mouth daily. 90 tablet 3   loratadine (CLARITIN) 10 MG tablet Take 1 tablet by mouth daily.     Multiple Vitamins-Minerals (CENTRUM MINIS WOMEN 50+) TABS Take by mouth daily at 6 (six) AM.     omeprazole (PRILOSEC) 40 MG capsule Take 1 capsule by mouth once daily (Patient taking differently: as needed (heartburn).) 90 capsule 3   Polyethylene Glycol 3350 (MIRALAX PO) Take by mouth as needed.     rosuvastatin (CRESTOR) 10 MG tablet Take 1 tablet by mouth once daily 90 tablet 3   fluticasone (FLONASE) 50 MCG/ACT nasal spray Place 2 sprays into both nostrils daily. (Patient taking differently: Place 2 sprays into both nostrils as needed for allergies.) 16 g 6   potassium chloride SA (KLOR-CON M) 20 MEQ tablet Take 1 tablet (20 mEq total) by mouth daily. 30 tablet 3   spironolactone (ALDACTONE) 25 MG tablet Take 0.5 tablets (12.5 mg total) by mouth daily. (Patient not taking: Reported on 07/17/2022) 30 tablet 1   traMADol (ULTRAM) 50 MG tablet Take 50 mg by mouth every 8 (eight) hours as needed.     Current Facility-Administered Medications  Medication Dose Route Frequency  Provider Last Rate Last Admin   0.9 %  sodium chloride infusion  500 mL Intravenous Once Imogene Burn, MD        Allergies as of 08/07/2022 - Review Complete 08/07/2022  Allergen Reaction Noted   Aldactone [spironolactone] Rash 03/23/2020    Family History  Problem Relation Age of Onset   Colon cancer Mother 55   Heart attack Father    Breast cancer Paternal Aunt    Colon cancer Brother 15   Colon polyps Brother    Esophageal cancer Neg Hx    Pancreatic cancer Neg Hx    Rectal cancer Neg Hx    Stomach cancer Neg Hx     Social History   Socioeconomic History  Marital status: Married    Spouse name: Not on file   Number of children: Not on file   Years of education: Not on file   Highest education level: Not on file  Occupational History   Not on file  Tobacco Use   Smoking status: Never   Smokeless tobacco: Never  Vaping Use   Vaping status: Never Used  Substance and Sexual Activity   Alcohol use: No   Drug use: No   Sexual activity: Yes  Other Topics Concern   Not on file  Social History Narrative   Not on file   Social Determinants of Health   Financial Resource Strain: Low Risk  (06/19/2022)   Overall Financial Resource Strain (CARDIA)    Difficulty of Paying Living Expenses: Not hard at all  Food Insecurity: No Food Insecurity (06/19/2022)   Hunger Vital Sign    Worried About Running Out of Food in the Last Year: Never true    Ran Out of Food in the Last Year: Never true  Transportation Needs: No Transportation Needs (06/19/2022)   PRAPARE - Administrator, Civil Service (Medical): No    Lack of Transportation (Non-Medical): No  Physical Activity: Inactive (06/19/2022)   Exercise Vital Sign    Days of Exercise per Week: 0 days    Minutes of Exercise per Session: 0 min  Stress: No Stress Concern Present (06/19/2022)   [employer] of Occupational Health - Occupational Stress Questionnaire    Feeling of Stress : Only a little  Social  Connections: Socially Integrated (06/19/2022)   Social Connection and Isolation Panel [NHANES]    Frequency of Communication with Friends and Family: More than three times a week    Frequency of Social Gatherings with Friends and Family: Three times a week    Attends Religious Services: More than 4 times per year    Active Member of Clubs or Organizations: Yes    Attends Banker Meetings: More than 4 times per year    Marital Status: Married  Catering manager Violence: Not At Risk (06/19/2022)   Humiliation, Afraid, Rape, and Kick questionnaire    Fear of Current or Ex-Partner: No    Emotionally Abused: No    Physically Abused: No    Sexually Abused: No    Physical Exam: Vital signs in last 24 hours: BP (!) 145/85   Pulse 63   Temp 97.8 F (36.6 C)   Resp 15   Ht 5\' 4"  (1.626 m)   Wt 249 lb (112.9 kg)   SpO2 99%   BMI 42.74 kg/m  GEN: NAD EYE: Sclerae anicteric ENT: MMM CV: Non-tachycardic Pulm: No increased WOB GI: Soft NEURO:  Alert & Oriented   Eulah Pont, MD [facility]   08/07/2022 8:19 AM

## 2022-08-08 ENCOUNTER — Telehealth: Payer: Self-pay

## 2022-08-08 NOTE — Telephone Encounter (Signed)
Left message on follow up call. 

## 2022-08-11 ENCOUNTER — Encounter: Payer: Self-pay | Admitting: Internal Medicine

## 2022-08-20 ENCOUNTER — Other Ambulatory Visit (HOSPITAL_BASED_OUTPATIENT_CLINIC_OR_DEPARTMENT_OTHER): Payer: Self-pay

## 2022-09-02 ENCOUNTER — Telehealth: Payer: Self-pay | Admitting: Internal Medicine

## 2022-09-02 NOTE — Telephone Encounter (Signed)
Scanned pt's patient assistance application to [employer], [employer]. FYI

## 2022-09-02 NOTE — Telephone Encounter (Signed)
Ok leaving pt's patient assistance paperwork at the front desk at [employer]. FYI

## 2022-09-02 NOTE — Telephone Encounter (Signed)
Patient called to inform us that she will be in office today to pick up the paperwork.

## 2022-09-02 NOTE — Telephone Encounter (Signed)
Paper Work Dropped Off: MED APPLICATION  Date:09/02/2022  Location of paper: [facility] IN FRONT OFFICE

## 2022-09-02 NOTE — Telephone Encounter (Signed)
**Note De-Identified [name] Obfuscation** The pt left the completed page 1 of her [employer] application for Jardiance assistance at the office and I have added it with the rest of her application that she left earlier today.  I completed the providers page of her application with our DOD, Dr Gershon Crane information and I have e-mailed all to his  nurse so she can obtain his signature in Dr Debby Bud absence, date it and to then fax all to [facility] Cares at the fax number written on the cover letter included.

## 2022-09-02 NOTE — Telephone Encounter (Signed)
**Note De-Identified Mattilynn Forrer Obfuscation** I received the pts [employer] application for Jardiance assistance but the 1 st page of the application was not included. I s/w Margaret Pyle, CMA who verified that only 3 pages were left at the office.  I called the pt but got no answer, so I left a message on her VM (Ok per [facility]) advising her that we are missing page 1 of her application for Jardiance assistance and that if she has it to please bring it to the office to drop off  and that if not, we are leaving the direction page and page 1 of a [employer] C[employer] application in the front office so she can come by to fill it out.  I did advise her in the VM message that if she has any questions to call Larita Fife back at Dr Debby Bud office at [phone].

## 2022-09-02 NOTE — Telephone Encounter (Signed)
Signed by MD, faxed, confirmation received

## 2022-09-04 NOTE — Telephone Encounter (Signed)
**Note De-Identified Mikael Skoda Obfuscation** Letter received [name] fax from [facility] stating that they have approved the pt for Jardiance assistance until 01/27/2023. The letter states that they have also notified the pt of this approval.

## 2022-09-05 ENCOUNTER — Telehealth: Payer: Self-pay | Admitting: Family Medicine

## 2022-09-05 ENCOUNTER — Other Ambulatory Visit (HOSPITAL_COMMUNITY): Payer: Self-pay

## 2022-09-05 ENCOUNTER — Other Ambulatory Visit: Payer: Self-pay

## 2022-09-05 DIAGNOSIS — I10 Essential (primary) hypertension: Secondary | ICD-10-CM

## 2022-09-05 DIAGNOSIS — I5032 Chronic diastolic (congestive) heart failure: Secondary | ICD-10-CM

## 2022-09-05 MED ORDER — DILTIAZEM HCL ER COATED BEADS 360 MG PO CP24
360.0000 mg | ORAL_CAPSULE | Freq: Every day | ORAL | 1 refills | Status: DC
Start: 2022-09-05 — End: 2023-06-02
  Filled 2022-09-05: qty 90, 90d supply, fill #0
  Filled 2022-11-29: qty 90, 90d supply, fill #1

## 2022-09-05 NOTE — Telephone Encounter (Signed)
Prescription Request  09/05/2022  LOV: 05/30/2022  What is the name of the medication or equipment?   diltiazem (CARDIZEM CD) 360 MG 24 hr capsule  *[facility] out of stock; patient requesting for script to be called in to [facility] outpatient [facility] on [address] **Patient out of meds for 2 days**  Have you contacted your pharmacy to request a refill? Yes   Which pharmacy would you like this sent to?    - [facility] [address] [place] [address] Phone: [phone] Fax: [phone]  Patient notified that their request is being sent to the clinical staff for review and that they should receive a response within 2 business days.   Please advise patient at [phone].

## 2022-09-10 DIAGNOSIS — M5416 Radiculopathy, lumbar region: Secondary | ICD-10-CM | POA: Diagnosis not present

## 2022-10-13 ENCOUNTER — Telehealth: Payer: Self-pay

## 2022-10-13 ENCOUNTER — Other Ambulatory Visit: Payer: Self-pay | Admitting: Family Medicine

## 2022-10-13 ENCOUNTER — Encounter (HOSPITAL_BASED_OUTPATIENT_CLINIC_OR_DEPARTMENT_OTHER): Payer: Self-pay | Admitting: Physical Therapy

## 2022-10-13 NOTE — Telephone Encounter (Signed)
Pt had f/u appointment with GYN last week and was advised that "there is nothing they can do for her" related to cyst in R adnexa. Pt states she is still having issues with discomfort. Appt made for Thursday to follow up. Thanks.

## 2022-10-14 NOTE — Telephone Encounter (Signed)
Requested medication (s) are due for refill today: expired medication - prilosec  Requested medication (s) are on the active medication list: yes   Last refill:  cardura - 07/15/22 #90 0 refills, prilosec- 03/25/21 #90 3 refills  Future visit scheduled: no   Notes to clinic:  no refills remain. Prilosec - expired medication . Do you want to refill Rxs?     Requested Prescriptions  Pending Prescriptions Disp Refills   doxazosin (CARDURA) 2 MG tablet [Pharmacy Med Name: Doxazosin Mesylate 2 MG Oral Tablet] 90 tablet 0    Sig: Take 1 tablet by mouth once daily     Cardiovascular:  Alpha Blockers Failed - 10/14/2022 12:50 PM      Failed - Last BP in normal range    BP Readings from Last 1 Encounters:  08/07/22 (!) 140/96         Failed - Valid encounter within last 6 months    Recent Outpatient Visits           1 year ago Change in bowel habits   [facility] Tanya Nones, Priscille Heidelberg, MD   1 year ago Ingrown thumb nail, right   [facility] Pickard, Priscille Heidelberg, MD   2 years ago General medical exam   [facility] Donita Brooks, MD   2 years ago Sinus pressure   [facility] Valentino Nose, NP   2 years ago Spinal stenosis of lumbar region at multiple levels   [facility] Pickard, Priscille Heidelberg, MD               omeprazole (PRILOSEC) 40 MG capsule [Pharmacy Med Name: OMEPRAZOLE DR 40MG   CAP] 90 capsule 0    Sig: Take 1 capsule by mouth once daily     Gastroenterology: Proton Pump Inhibitors Failed - 10/14/2022 12:50 PM      Failed - Valid encounter within last 12 months    Recent Outpatient Visits           1 year ago Change in bowel habits   [facility] Pickard, Priscille Heidelberg, MD   1 year ago Ingrown thumb nail, right   [facility] Pickard, Priscille Heidelberg, MD   2 years ago General medical exam   [facility] Donita Brooks, MD   2 years ago Sinus  pressure   [facility] Valentino Nose, NP   2 years ago Spinal stenosis of lumbar region at multiple levels   [facility] Pickard, Priscille Heidelberg, MD

## 2022-10-16 ENCOUNTER — Ambulatory Visit: Payer: Medicare HMO | Admitting: Family Medicine

## 2022-10-16 VITALS — BP 140/86 | HR 78 | Temp 98.5°F | Ht 64.0 in | Wt 244.0 lb

## 2022-10-16 DIAGNOSIS — R1031 Right lower quadrant pain: Secondary | ICD-10-CM

## 2022-10-16 NOTE — Progress Notes (Signed)
Subjective:    Patient ID: [name], female    DOB: 10-Nov-1945, 77 y.o.   MRN: [id number]  HPI   In 2022, the patient had a CT scan of the abdomen and pelvis due to right lower quadrant pain.  At that time, a 2.1 cm benign-appearing cyst like structure was seen in the right adnexa thought to be a benign cyst like mass.  However gynecology was concerned because she has had a hysterectomy and bilateral salpingo-oophorectomy.  Repeat CT in 4/24 showed: IMPRESSION: Complex right adnexal cystic lesion again identified which has been slowly increasing in size since 2015. With this level of slow interval growth, benign lesion is likely. No free fluid. No follow-up imaging recommended. Note: This recommendation does not apply to premenarchal patients and to those with increased risk (genetic, family history, elevated tumor markers or other high-risk factors) of ovarian cancer. Reference: JACR 2020 Feb; 17(2):248-254   Colonic diverticula.   Bilateral benign renal cysts.   Previous cholecystectomy with a dilated biliary tree unchanged from previous examinations, likely related to post cholecystectomy state  Due to the pain, she saw GI earlier this year and had a colonoscopy 7/24.  5 polyps were removed but no cause for pain was identified.   10/16/22 Patient continues to report right lower quadrant abdominal pain.  The pain can be severe at times.  Other times it is manageable but is present on a daily basis.  The pain does not radiate.  Dull constant intense ache.  She denies any fevers or chills.  She denies any melena or hematochezia.  Laying flat on her back sometimes makes the pain better.  She denies any constipation.  She denies any nausea or vomiting.  She denies any dysuria or hematuria  Past Medical History:  Diagnosis Date   Allergy    Arthritis    arthritis -knees   Colon polyps    benign- last colonoscopy 09-2008   Depression    GERD (gastroesophageal reflux disease)     hx GERD   H/O seasonal allergies    no problems now   Hyperlipidemia    Hypertension    Murmur, heart    Spinal stenosis of lumbar region at multiple levels    Past Surgical History:  Procedure Laterality Date   ABDOMINAL HYSTERECTOMY     APPENDECTOMY     with gallbladder surgery   BUNIONECTOMY Bilateral    CHOLECYSTECTOMY     open-gallstones   COLONOSCOPY     EXCISION OF ADNEXAL MASS Left August 03, 2013   at [facility]/right adnexal nodule,excision;benigh lymph node   OOPHORECTOMY Bilateral 2008   ovarian cyst   POLYPECTOMY     Current Outpatient Medications on File Prior to Visit  Medication Sig Dispense Refill   acetaminophen (TYLENOL) 650 MG CR tablet Take by mouth as needed for pain.     albuterol (VENTOLIN HFA) 108 (90 Base) MCG/ACT inhaler INHALE 2 PUFFS BY MOUTH EVERY 4 HOURS AS NEEDED FOR WHEEZING FOR SHORTNESS OF BREATH 18 g 0   diltiazem (CARDIZEM CD) 360 MG 24 hr capsule Take 1 capsule (360 mg total) by mouth daily. 90 capsule 1   doxazosin (CARDURA) 2 MG tablet Take 1 tablet by mouth once daily 90 tablet 0   empagliflozin (JARDIANCE) 10 MG TABS tablet Take 1 tablet (10 mg total) by mouth daily before breakfast. 90 tablet 2   fluticasone (FLONASE) 50 MCG/ACT nasal spray Place 2 sprays into both nostrils daily. (Patient taking differently:  Place 2 sprays into both nostrils as needed for allergies.) 16 g 6   furosemide (LASIX) 40 MG tablet Take 1.5 tablets (60 mg total) by mouth 2 (two) times daily. 270 tablet 3   lisinopril (ZESTRIL) 40 MG tablet Take 1 tablet (40 mg total) by mouth daily. 90 tablet 3   loratadine (CLARITIN) 10 MG tablet Take 1 tablet by mouth daily.     Multiple Vitamins-Minerals (CENTRUM MINIS WOMEN 50+) TABS Take by mouth daily at 6 (six) AM.     omeprazole (PRILOSEC) 40 MG capsule Take 1 capsule by mouth once daily (Patient taking differently: as needed (heartburn).) 90 capsule 3   Polyethylene Glycol 3350 (MIRALAX PO) Take by mouth as needed.      potassium chloride SA (KLOR-CON M) 20 MEQ tablet Take 1 tablet (20 mEq total) by mouth daily. 30 tablet 3   rosuvastatin (CRESTOR) 10 MG tablet Take 1 tablet by mouth once daily 90 tablet 3   spironolactone (ALDACTONE) 25 MG tablet Take 0.5 tablets (12.5 mg total) by mouth daily. (Patient not taking: Reported on 07/17/2022) 30 tablet 1   traMADol (ULTRAM) 50 MG tablet Take 50 mg by mouth every 8 (eight) hours as needed.     No current facility-administered medications on file prior to visit.   Allergies  Allergen Reactions   Aldactone [Spironolactone] Rash   Social History   Socioeconomic History   Marital status: Married    Spouse name: Not on file   Number of children: Not on file   Years of education: Not on file   Highest education level: Not on file  Occupational History   Not on file  Tobacco Use   Smoking status: Never   Smokeless tobacco: Never  Vaping Use   Vaping status: Never Used  Substance and Sexual Activity   Alcohol use: No   Drug use: No   Sexual activity: Yes  Other Topics Concern   Not on file  Social History Narrative   Not on file   Social Determinants of Health   Financial Resource Strain: Low Risk  (06/19/2022)   Overall Financial Resource Strain (CARDIA)    Difficulty of Paying Living Expenses: Not hard at all  Food Insecurity: No Food Insecurity (06/19/2022)   Hunger Vital Sign    Worried About Running Out of Food in the Last Year: Never true    Ran Out of Food in the Last Year: Never true  Transportation Needs: No Transportation Needs (06/19/2022)   PRAPARE - Administrator, Civil Service (Medical): No    Lack of Transportation (Non-Medical): No  Physical Activity: Inactive (06/19/2022)   Exercise Vital Sign    Days of Exercise per Week: 0 days    Minutes of Exercise per Session: 0 min  Stress: No Stress Concern Present (06/19/2022)   [employer] of Occupational Health - Occupational Stress Questionnaire    Feeling of Stress :  Only a little  Social Connections: Socially Integrated (06/19/2022)   Social Connection and Isolation Panel [NHANES]    Frequency of Communication with Friends and Family: More than three times a week    Frequency of Social Gatherings with Friends and Family: Three times a week    Attends Religious Services: More than 4 times per year    Active Member of Clubs or Organizations: Yes    Attends Banker Meetings: More than 4 times per year    Marital Status: Married  Catering manager Violence: Not At Risk (  06/19/2022)   Humiliation, Afraid, Rape, and Kick questionnaire    Fear of Current or Ex-Partner: No    Emotionally Abused: No    Physically Abused: No    Sexually Abused: No      Review of Systems  All other systems reviewed and are negative.      Objective:   Physical Exam Vitals reviewed.  Constitutional:      Appearance: Normal appearance.  Cardiovascular:     Rate and Rhythm: Normal rate and regular rhythm.     Pulses: Normal pulses.     Heart sounds: Murmur heard.  Pulmonary:     Effort: Pulmonary effort is normal. No respiratory distress.     Breath sounds: Normal breath sounds. No stridor. No wheezing, rhonchi or rales.  Chest:     Chest wall: No tenderness.  Abdominal:     General: Abdomen is flat. Bowel sounds are normal. There is no distension.     Palpations: Abdomen is soft.     Tenderness: There is no abdominal tenderness. There is no guarding or rebound.  Musculoskeletal:     Right lower leg: No edema.     Left lower leg: No edema.  Neurological:     Mental Status: She is alert.         Assessment & Plan:  Right lower quadrant abdominal pain Patient has chronic right lower quadrant abdominal pain.  CT scan has shown a benign cystlike mass in the right axilla area.  This is slowly growing for years.  I do not think that this would be sufficient to cause pain.  The other possibility is adhesions from previous surgeries.  She has had a CAT  scan as well as a GI consultation with no other causes identified.  GYN does not feel comfortable removing the cyst like mass.  Therefore I will consult general surgery for possible resection of the cystlike mass and also lysis of adhesions

## 2022-10-17 ENCOUNTER — Other Ambulatory Visit: Payer: Self-pay

## 2022-10-17 DIAGNOSIS — I10 Essential (primary) hypertension: Secondary | ICD-10-CM

## 2022-10-17 MED ORDER — DOXAZOSIN MESYLATE 2 MG PO TABS
2.0000 mg | ORAL_TABLET | Freq: Every day | ORAL | 1 refills | Status: DC
Start: 2022-10-17 — End: 2023-04-09

## 2022-10-17 NOTE — Telephone Encounter (Signed)
Patient called to follow up on refills requested for the following:  doxazosin (CARDURA) 2 MG tablet [[id number]]   omeprazole (PRILOSEC) 40 MG capsule [[id number]]   LOV 10/16/22, CPE 05/30/22  Pharmacy confirmed as:  [id number] - [place] ([place]), [place] - [address] [place] ([place]) [place] [address] Phone: [phone]  Fax: [phone]   Please advise patient at [phone]

## 2022-10-28 ENCOUNTER — Other Ambulatory Visit: Payer: Self-pay

## 2022-10-28 ENCOUNTER — Telehealth: Payer: Self-pay

## 2022-10-28 DIAGNOSIS — K219 Gastro-esophageal reflux disease without esophagitis: Secondary | ICD-10-CM

## 2022-10-28 MED ORDER — OMEPRAZOLE 40 MG PO CPDR
40.0000 mg | DELAYED_RELEASE_CAPSULE | Freq: Every day | ORAL | 3 refills | Status: AC
Start: 2022-10-28 — End: ?

## 2022-10-28 NOTE — Telephone Encounter (Signed)
Prescription Request  10/28/2022  LOV: 10/16/22  What is the name of the medication or equipment? omeprazole (PRILOSEC) 40 MG capsule [[id number]]   Have you contacted your pharmacy to request a refill? Yes   Which pharmacy would you like this sent to?  [id number] - [place] ([place]), [place] - [address] [place] ([place]) [place] [address] Phone: [phone] Fax: [phone]    Patient notified that their request is being sent to the clinical staff for review and that they should receive a response within 2 business days.   Please advise at [facility] [phone]

## 2022-10-29 ENCOUNTER — Ambulatory Visit: Payer: Medicare HMO

## 2022-10-29 DIAGNOSIS — Z23 Encounter for immunization: Secondary | ICD-10-CM

## 2022-11-07 ENCOUNTER — Other Ambulatory Visit: Payer: Self-pay | Admitting: Internal Medicine

## 2022-11-12 ENCOUNTER — Other Ambulatory Visit: Payer: Self-pay | Admitting: Internal Medicine

## 2022-11-12 DIAGNOSIS — H2513 Age-related nuclear cataract, bilateral: Secondary | ICD-10-CM | POA: Diagnosis not present

## 2022-11-12 DIAGNOSIS — H04123 Dry eye syndrome of bilateral lacrimal glands: Secondary | ICD-10-CM | POA: Diagnosis not present

## 2022-11-12 DIAGNOSIS — Z9889 Other specified postprocedural states: Secondary | ICD-10-CM | POA: Diagnosis not present

## 2022-11-20 ENCOUNTER — Ambulatory Visit: Payer: Medicare HMO | Admitting: General Surgery

## 2022-11-20 ENCOUNTER — Encounter: Payer: Medicare HMO | Admitting: Pharmacist

## 2022-11-20 ENCOUNTER — Encounter: Payer: Self-pay | Admitting: General Surgery

## 2022-11-20 VITALS — BP 115/74 | HR 74 | Temp 98.0°F | Resp 16 | Ht 64.0 in | Wt 250.0 lb

## 2022-11-20 DIAGNOSIS — R1031 Right lower quadrant pain: Secondary | ICD-10-CM

## 2022-11-20 DIAGNOSIS — G8929 Other chronic pain: Secondary | ICD-10-CM

## 2022-11-20 NOTE — Progress Notes (Signed)
[facility] History and Physical  Reason for Referral: Abdominal pain right lower abdomen, adnexal cyst on CT Referring Physician: Donita Brooks, MD   Chief Complaint   New Patient (Initial Visit)     [name] is a 77 y.o. female.  HPI: [name] is a 77 yo who has a complicated surgical history and a long standing history of pelvic lower abdominal pain dating back to the 2000s. I have reviewed her old notes and Dr. Forrestine Him notes in 2015 to get most of this information.  She initially had a vaginal hysterectomy in 1980s for sterilization, then right salpingectomy in 1990s for tubal cyst. She then had pelvic pain in the 2000s. She had a laparoscopic to open exploration in 2004 with lysis of adhesions, left salpingo-oophorectomy, right ovarian cystectomy. She then did fair but started to have pelvic pain again and underwent her last laparotomy in 2015 and had a laparotomy with extensive adhesions, excision of a left adnexal mass and a right adnexal nodule with both pathologies being benign. She had issues with pSBO after that surgery and was in the hospital for several days.   She says she has this random right lower abdominal pain that is on the right lower abdomen about 8cm from her umbilicus but well above her pelvis. She says this feels superficial, last only about <1 hour and only occurs once or twice a month. She has not tried anything for it outside of getting in the shower which does seem to help.  She had a CT scan in April 2024 and there was concern for an adnexal cyst. This has been essentially stable since 2022 scans and if it has increased in size it is only by 2-74mm max.   Her GYN now is Dr. Cherly Hensen she reports. She says Dr. Cherly Hensen reported that there was no operation needed. The patient was referred by her PCP for further workup for this chronic pain.   Past Medical History:  Diagnosis Date   Allergy    Arthritis    arthritis -knees   Colon polyps     benign- last colonoscopy 09-2008   Depression    GERD (gastroesophageal reflux disease)    hx GERD   H/O seasonal allergies    no problems now   Hyperlipidemia    Hypertension    Murmur, heart    Spinal stenosis of lumbar region at multiple levels     Past Surgical History:  Procedure Laterality Date   ABDOMINAL HYSTERECTOMY     APPENDECTOMY     with gallbladder surgery   BUNIONECTOMY Bilateral    CHOLECYSTECTOMY     open-gallstones   COLONOSCOPY     EXCISION OF ADNEXAL MASS Left August 03, 2013   at [facility]/right adnexal nodule,excision;benigh lymph node   OOPHORECTOMY Bilateral 2008   ovarian cyst   POLYPECTOMY      Family History  Problem Relation Age of Onset   Colon cancer Mother 18   Heart attack Father    Breast cancer Paternal Aunt    Colon cancer Brother 100   Colon polyps Brother    Esophageal cancer Neg Hx    Pancreatic cancer Neg Hx    Rectal cancer Neg Hx    Stomach cancer Neg Hx     Social History   Tobacco Use   Smoking status: Never   Smokeless tobacco: Never  Vaping Use   Vaping status: Never Used  Substance Use Topics   Alcohol use: No  Drug use: No    Medications: I have reviewed the patient's current medications. Allergies as of 11/20/2022       Reactions   Aldactone [spironolactone] Rash        Medication List        Accurate as of November 20, 2022  3:18 PM. If you have any questions, ask your nurse or doctor.          acetaminophen 650 MG CR tablet Commonly known as: TYLENOL Take by mouth as needed for pain.   albuterol 108 (90 Base) MCG/ACT inhaler Commonly known as: VENTOLIN HFA INHALE 2 PUFFS BY MOUTH EVERY 4 HOURS AS NEEDED FOR WHEEZING FOR SHORTNESS OF BREATH   Centrum Minis Women 50+ Tabs Take by mouth daily at 6 (six) AM.   diltiazem 360 MG 24 hr capsule Commonly known as: CARDIZEM CD Take 1 capsule (360 mg total) by mouth daily.   doxazosin 2 MG tablet Commonly known as: CARDURA Take 1 tablet (2  mg total) by mouth daily.   empagliflozin 10 MG Tabs tablet Commonly known as: Jardiance Take 1 tablet (10 mg total) by mouth daily before breakfast.   fluticasone 50 MCG/ACT nasal spray Commonly known as: FLONASE Place 2 sprays into both nostrils daily. What changed:  when to take this reasons to take this   furosemide 40 MG tablet Commonly known as: LASIX TAKE 1 & 1/2 (ONE & ONE-HALF) TABLETS BY MOUTH TWICE DAILY   lisinopril 40 MG tablet Commonly known as: ZESTRIL Take 1 tablet (40 mg total) by mouth daily.   loratadine 10 MG tablet Commonly known as: CLARITIN Take 1 tablet by mouth daily.   MIRALAX PO Take by mouth as needed.   omeprazole 40 MG capsule Commonly known as: PRILOSEC Take 1 capsule (40 mg total) by mouth daily.   potassium chloride SA 20 MEQ tablet Commonly known as: KLOR-CON M Take 1 tablet (20 mEq total) by mouth daily.   rosuvastatin 10 MG tablet Commonly known as: CRESTOR Take 1 tablet by mouth once daily   spironolactone 25 MG tablet Commonly known as: ALDACTONE Take 0.5 tablets (12.5 mg total) by mouth daily.   traMADol 50 MG tablet Commonly known as: ULTRAM Take 50 mg by mouth every 8 (eight) hours as needed.         ROS:  A comprehensive review of systems was negative except for: Respiratory: positive for wheezing Gastrointestinal: positive for abdominal pain and reflux symptoms Musculoskeletal: positive for back pain, neck pain, and joint pain  Blood pressure 115/74, pulse 74, temperature 98 F (36.7 C), temperature source Oral, resp. rate 16, height 5\' 4"  (1.626 m), weight 250 lb (113.4 kg), SpO2 91%. Physical Exam Vitals reviewed.  Constitutional:      Appearance: She is obese.  HENT:     Head: Normocephalic.     Nose: Nose normal.  Eyes:     Extraocular Movements: Extraocular movements intact.  Cardiovascular:     Rate and Rhythm: Normal rate and regular rhythm.  Pulmonary:     Effort: Pulmonary effort is normal.      Breath sounds: Normal breath sounds.  Abdominal:     General: There is no distension.     Palpations: Abdomen is soft.     Tenderness: There is abdominal tenderness.       Comments: Right lower abdomen tenderness in pannus about 8cm from the umbilicus down lateral toward hip, not really in the region of the pelvic cyst, minimally tender  Musculoskeletal:        General: Normal range of motion.     Cervical back: Normal range of motion.  Skin:    General: Skin is warm.  Neurological:     General: No focal deficit present.     Mental Status: She is alert and oriented to person, place, and time.  Psychiatric:        Mood and Affect: Mood normal.        Behavior: Behavior normal.        Thought Content: Thought content normal.        Judgment: Judgment normal.     Results: Personally reviewed CT and old Cts; showed patient images and Dr. Despina Hidden also reviewed imaging at my request- there is a cystic lesion in the "region" of the right adnexa but she has had surgery with oophorectomy, this is likely a peritoneal inclusion cyst where simple fluid was trapped in some scar tissue adhesions, it has grown minimally 2-52mm at most in the last few years   CLINICAL DATA:  Adnexal cysts.  Prior bilateral oophorectomy.   EXAM: CT ABDOMEN AND PELVIS WITH CONTRAST   TECHNIQUE: Multidetector CT imaging of the abdomen and pelvis was performed  using the standard protocol following bolus administration of intravenous contrast.   RADIATION DOSE REDUCTION: This exam was performed according to the departmental dose-optimization program which includes automated exposure control, adjustment of the mA and/or kV according to patient size and/or use of iterative reconstruction technique.   CONTRAST:  OMNIPAQUE IOHEXOL 300 MG/ML  SOLN   COMPARISON:  CT 01/15/2021 and older   FINDINGS: Lower chest: Trace pleural effusion bilaterally. Tiny pericardial effusion. There is some linear opacity at the  bases otherwise likely scar or atelectasis. The top of the diaphragm in the liver are not included in the imaging field.   Hepatobiliary: Dilated biliary tree with a tapering of the duct towards the pancreatic head this is unchanged from previous. The patient is status post cholecystectomy. No specific space-occupying liver lesion in the visualized portions of the liver. Again the top of the liver is clipped off the edge of the film, field-of-view.   Pancreas: Global moderate atrophy of the pancreas without mass.   Spleen: Normal in size without focal abnormality.   Adrenals/Urinary Tract: The adrenal glands are preserved. Left-sided renal cysts identified with a thin septa. Diameter of up to 6 cm with Hounsfield unit overall of close to 1. No aggressive features. Bosniak 2 lesion. There are some smaller foci of the right kidney which also appears simple, Bosniak 1 lesions. No specific imaging follow-up of these lesions. There is a 3 mm upper pole non obstructing left-sided renal stone. Punctate right-sided renal stone. The ureters have normal course and caliber down to the bladder. The bladder has a preserved contour.   Stomach/Bowel: Large bowel has a normal course and caliber with some mild colonic stool. Few left-sided colonic diverticula. Stomach is nondilated. Small bowel is nondilated.   Vascular/Lymphatic: Normal caliber aorta and IVC with scattered vascular calcifications. No specific abnormal lymph node enlargement identified   Reproductive: Uterus is absent. There is a cystic lesion in the right adnexa. Today on series 2 image 70 this would have measured 2.7 x 2.1 cm and on the prior study going back on measuring in the same fashion as today's exam the lesion would have measured 2.4 by 1.9 cm, slightly increased in size, slow interval growth. The lesion is slightly heterogeneous appearance. Going back to older  study of 2015 there is a lesion present in this location measuring 16 mm.    Other: No free air or free fluid.   Musculoskeletal: Moderate degenerative changes of the spine with multilevel stenosis with disc bulging and osteophytes. Grade 1 anterolisthesis as well of L4 on L5. Scattered degenerative changes of the pelvis as well.   IMPRESSION: Complex right adnexal cystic lesion again identified which has been slowly increasing in size since 2015. With this level of slow interval growth, benign lesion is likely. No free fluid. No follow-up imaging recommended. Note: This recommendation does not apply to premenarchal patients and to those with increased risk (genetic, family history, elevated tumor markers or other high-risk factors) of ovarian cancer. Reference: JACR 2020 Feb; 17(2):248-254   Colonic diverticula.   Bilateral benign renal cysts.   Previous cholecystectomy with a dilated biliary tree unchanged from previous examinations, likely related to post cholecystectomy state     Electronically Signed   By: Karen Kays M.D.   On: 05/22/2022 17:29   Assessment & Plan:  BRITTAY CELIS is a 77 y.o. female with a lesion in hte right pelvic region that is 2.63mm max in size. This has grown minimally. I showed the patient the imaging and explained how large this was. I do not think this is causing this vague pain and do not think any exploration is warranted as this looks and is acting benign, changing slowly and she has had multiple surgeries in this region and reasons for simple pockets of fluid to get trapped. With her laparotomy history she is high risk of adhesions, intestinal injury and hernia. Discussed that the risk out weigh any benefits and I agree with Dr. Wonda Olds that another surgery is not indicated.   I have asked Dr. Despina Hidden to look at the imaging too and he agrees. Her history has been reviewed.   If something changes or the area enlarges then I think we could consider other options and could even see if IR could biopsy or Aspirate the area before doing a  major laparotomy.  Right now I think the risk of doing anything out weighs the benefits and that there is real risk any making this minimal pain worse or having a much worse complication.    All questions were answered to the satisfaction of the patient. Can try lidocaine patches if the area is bothering you for a long period of time.   PRN follow up   Lucretia Roers 11/20/2022, 3:18 PM

## 2022-11-20 NOTE — Patient Instructions (Signed)
Can try lidocaine patches if the area is bothering you for a long period of time.

## 2022-12-12 ENCOUNTER — Other Ambulatory Visit: Payer: Self-pay | Admitting: Nurse Practitioner

## 2022-12-12 DIAGNOSIS — H938X1 Other specified disorders of right ear: Secondary | ICD-10-CM

## 2022-12-12 NOTE — Telephone Encounter (Signed)
Requested medication (s) are due for refill today - expired Rx  Requested medication (s) are on the active medication list -yes  Future visit scheduled -no  Last refill: 08/10/20 16g 6RF  Notes to clinic: Rx written by former provider- no longer at practice  Requested Prescriptions  Pending Prescriptions Disp Refills   fluticasone (FLONASE) 50 MCG/ACT nasal spray [Pharmacy Med Name: Fluticasone Propionate 50 MCG/ACT Nasal Suspension] 16 g 0    Sig: Use 2 spray(s) in each nostril once daily     Ear, Nose, and Throat: Nasal Preparations - Corticosteroids Failed - 12/12/2022  9:23 AM      Failed - Valid encounter within last 12 months    Recent Outpatient Visits           1 year ago Change in bowel habits   [facility] Pickard, Priscille Heidelberg, MD   1 year ago Ingrown thumb nail, right   [facility] Pickard, Priscille Heidelberg, MD   2 years ago General medical exam   [facility] Donita Brooks, MD   2 years ago Sinus pressure   [facility] Valentino Nose, NP   3 years ago Spinal stenosis of lumbar region at multiple levels   [facility] Pickard, Priscille Heidelberg, MD       Future Appointments             In 2 weeks Sharlene Dory, PA-C [facility] [employer], [facility]               Requested Prescriptions  Pending Prescriptions Disp Refills   fluticasone (FLONASE) 50 MCG/ACT nasal spray [Pharmacy Med Name: Fluticasone Propionate 50 MCG/ACT Nasal Suspension] 16 g 0    Sig: Use 2 spray(s) in each nostril once daily     Ear, Nose, and Throat: Nasal Preparations - Corticosteroids Failed - 12/12/2022  9:23 AM      Failed - Valid encounter within last 12 months    Recent Outpatient Visits           1 year ago Change in bowel habits   [facility] Donita Brooks, MD   1 year ago Ingrown thumb nail, right   [facility] Pickard, Priscille Heidelberg, MD   2  years ago General medical exam   [facility] Donita Brooks, MD   2 years ago Sinus pressure   [facility] Valentino Nose, NP   3 years ago Spinal stenosis of lumbar region at multiple levels   [facility] Pickard, Priscille Heidelberg, MD       Future Appointments             In 2 weeks Sharlene Dory, PA-C [facility], [facility]

## 2022-12-18 DIAGNOSIS — M5416 Radiculopathy, lumbar region: Secondary | ICD-10-CM | POA: Diagnosis not present

## 2022-12-22 ENCOUNTER — Other Ambulatory Visit: Payer: Self-pay

## 2022-12-22 DIAGNOSIS — H938X1 Other specified disorders of right ear: Secondary | ICD-10-CM

## 2022-12-22 NOTE — Telephone Encounter (Signed)
Prescription Request  12/22/2022  LOV: 10/16/22  What is the name of the medication or equipment? fluticasone (FLONASE) 50 MCG/ACT nasal spray [[id number]]  Have you contacted your pharmacy to request a refill? Yes   Which pharmacy would you like this sent to?  [facility] - [place] ([place]), [place] - [address] [place] ([place]) [place] [address] Phone: [phone] Fax: [phone]    Patient notified that their request is being sent to the clinical staff for review and that they should receive a response within 2 business days.   Please advise at [facility] [phone]

## 2022-12-23 NOTE — Telephone Encounter (Signed)
Requested medication (s) are due for refill today: yes  Requested medication (s) are on the active medication list:yes  Last refill:  08/10/20 16 grams 6 RF  Future visit scheduled: no  Notes to clinic:  last prescribed by Phil Dopp NP   Requested Prescriptions  Pending Prescriptions Disp Refills   fluticasone (FLONASE) 50 MCG/ACT nasal spray 16 g 6    Sig: Place 2 sprays into both nostrils daily.     Ear, Nose, and Throat: Nasal Preparations - Corticosteroids Failed - 12/22/2022  1:15 PM      Failed - Valid encounter within last 12 months    Recent Outpatient Visits           1 year ago Change in bowel habits   [facility] Medicine Pickard, Priscille Heidelberg, MD   1 year ago Ingrown thumb nail, right   [facility] Pickard, Priscille Heidelberg, MD   2 years ago General medical exam   [facility] Medicine Donita Brooks, MD   2 years ago Sinus pressure   [facility] Medicine Valentino Nose, NP   3 years ago Spinal stenosis of lumbar region at multiple levels   [facility] Medicine Pickard, Priscille Heidelberg, MD       Future Appointments             In 6 days Sharlene Dory, PA-C [facility] at [facility]

## 2022-12-28 NOTE — Progress Notes (Addendum)
Cardiology Office Note:  .   Date:  01/09/2023  ID:  [name], DOB 1946-01-04, MRN [id number] PCP: Donita Brooks, MD  [facility] Providers Cardiologist:  Christell Constant, MD {  History of Present Illness: .   [name] is a 77 y.o. female with past medical history of morbid obesity, hypertension, hyperlipidemia who originally presented for evaluation 12/27/2019.  She is here for follow-up appointment.  In 2022 she had an abnormal CMR (no HCM).  Stopped Aldactone because of rash (see 03/23/2020 note).  Lasix was increased.  Patient was hesitant to start rosuvastatin 10 mg given her husband had problems with atorvastatin in the past.  In 2023 she was started on a SGLT2 inhibitor, her Lasix had been dropped to 1 a day with worsening leg edema.  Started [place] but cost was notable.  Had AKI and did not further increase diuretics.  Echocardiogram stable   Patient noted she was doing fine at last follow-up 12/27/2021.  She noted an improvement in her legs and breathing.  No interval hospital/ED visits.  No chest pain or pressure.  No shortness of breath or DOE.  No PND/orthopnea.  Weight was about the same with some fluctuations.  Lower extremity edema had improved.  Rarely uses inhaler for COPD.  No palpitations or syncope.  Today, she presents for a routine follow-up. She reports no issues with her heart since her last visit a year ago. The patient's EKG shows a few PVCs, but she reports no symptoms associated with this. She has been told about PVCs before and understands that they are not dangerous unless they occur very frequently. The patient also reports occasional swelling in the legs, which lasts for a week or two and then stops. She is on Lasix for this issue and can take an extra half pill as needed for the swelling. The patient also reports some issues with her neck, which is likely musculoskeletal. She uses Biofreeze for relief and reports that it helps. She can  also use heat or ice whichever feels good.  Discussed the use of AI scribe software for clinical note transcription with the patient, who gave verbal consent to proceed.   Reports no shortness of breath nor dyspnea on exertion. Reports no chest pain, pressure, or tightness. No edema, orthopnea, PND. Reports no palpitations.    ROS: Pertinent ROS in HPI  Studies Reviewed: Marland Kitchen   EKG Interpretation Date/Time:  Monday December 29 2022 08:46:06 EST Ventricular Rate:  67 PR Interval:  158 QRS Duration:  96 QT Interval:  414 QTC Calculation: 437 R Axis:   -26  Text Interpretation: Sinus rhythm with occasional Premature ventricular complexes Moderate voltage criteria for LVH, may be normal variant ( R in aVL , Cornell product ) Septal infarct , age undetermined When compared with ECG of 14-Jul-2013 11:40, Septal infarct is now Present Confirmed by Jari Favre [phone]) on 01/09/2023 1:08:08 PM   Echo 10/10/21 IMPRESSIONS     1. Mild concentric LVH with severe hypertrophy of basal septum.   2. Left ventricular ejection fraction, by estimation, is 60 to 65%. The  left ventricle has normal function. The left ventricle has no regional  wall motion abnormalities. There is severe left ventricular hypertrophy of  the basal-septal segment. Left  ventricular diastolic parameters are consistent with Grade I diastolic  dysfunction (impaired relaxation). The average left ventricular global  longitudinal strain is -25.5 %. The global longitudinal strain is normal.   3. Right ventricular  systolic function is normal. The right ventricular  size is normal.   4. The mitral valve is normal in structure. Trivial mitral valve  regurgitation. No evidence of mitral stenosis.   5. The aortic valve is tricuspid. Aortic valve regurgitation is trivial.  Aortic valve sclerosis is present, with no evidence of aortic valve  stenosis.   6. The inferior vena cava is normal in size with greater than 50%  respiratory  variability, suggesting right atrial pressure of 3 mmHg.   FINDINGS   Left Ventricle: Left ventricular ejection fraction, by estimation, is 60  to 65%. The left ventricle has normal function. The left ventricle has no  regional wall motion abnormalities. The average left ventricular global  longitudinal strain is -25.5 %.  The global longitudinal strain is normal. The left ventricular internal  cavity size was normal in size. There is severe left ventricular  hypertrophy of the basal-septal segment. Left ventricular diastolic  parameters are consistent with Grade I diastolic  dysfunction (impaired relaxation). Indeterminate filling pressures.   Right Ventricle: The right ventricular size is normal. Right ventricular  systolic function is normal.   Left Atrium: Left atrial size was normal in size.   Right Atrium: Right atrial size was normal in size.   Pericardium: Trivial pericardial effusion is present.   Mitral Valve: The mitral valve is normal in structure. Trivial mitral  valve regurgitation. No evidence of mitral valve stenosis.   Tricuspid Valve: The tricuspid valve is normal in structure. Tricuspid  valve regurgitation is trivial. No evidence of tricuspid stenosis.   Aortic Valve: The aortic valve is tricuspid. Aortic valve regurgitation is  trivial. Aortic regurgitation PHT measures 572 msec. Aortic valve  sclerosis is present, with no evidence of aortic valve stenosis.   Pulmonic Valve: The pulmonic valve was normal in structure. Pulmonic valve  regurgitation is not visualized. No evidence of pulmonic stenosis.   Aorta: The aortic root is normal in size and structure.   Venous: The inferior vena cava is normal in size with greater than 50%  respiratory variability, suggesting right atrial pressure of 3 mmHg.   IAS/Shunts: No atrial level shunt detected by color flow Doppler.   Additional Comments: Mild concentric LVH with severe hypertrophy of basal  septum.        Physical Exam:   VS:  There were no vitals taken for this visit.   Wt Readings from Last 3 Encounters:  11/20/22 250 lb (113.4 kg)  10/16/22 244 lb (110.7 kg)  08/07/22 249 lb (112.9 kg)    GEN: Well nourished, well developed in no acute distress NECK: No JVD; No carotid bruits CARDIAC: RRR with occasional PVC, no murmurs, rubs, gallops RESPIRATORY:  Clear to auscultation without rales, wheezing or rhonchi  ABDOMEN: Soft, non-tender, non-distended EXTREMITIES:  No edema; No deformity   ASSESSMENT AND PLAN: .    Premature Ventricular Complexes (PVCs) Asymptomatic with only two PVCs noted on EKG. Discussed the benign nature of infrequent PVCs and advised to report any new symptoms such as fatigue or palpitations. - No change in management.  Heart Murmur Patient reports a known history of a heart murmur. Echocardiogram from September of the previous year showed mild left ventricular hypertrophy, likely the cause of the murmur. - Continue blood pressure control to manage left ventricular hypertrophy.  Hypertension Blood pressure slightly elevated at the visit, likely due to missed morning medications. Patient reports occasional stomach issues in the morning affecting medication adherence. - Continue Lisinopril and Cardizem as prescribed. -  Monitor blood pressure at home, specifically an hour after taking medications. - Report if blood pressure readings consistently in the 140s/150s even after medication.  Peripheral Edema Patient reports intermittent swelling in the legs lasting for a week or two at a time. Currently taking Lasix 40mg  daily, less than prescribed dose. - Continue Lasix 40mg  daily. - Add an extra half pill as needed during periods of increased swelling. - No need for repeat labs at this time as kidney function was stable at last check and patient is taking less diuretics than prescribed.  Neck Pain Patient reports occasional neck pain, likely musculoskeletal in  nature. - Continue use of Biofreeze as needed. - Consider over-the-counter transdermal pain patches and heat/ice application for additional relief.  Hyperlipidemia LDL 53, continue current medication regimen -will in be due in the spring for updated labs with PCP       Dispo: She can follow-up in a year with Dr. Izora Ribas  Signed, Sharlene Dory, PA-C

## 2022-12-29 ENCOUNTER — Ambulatory Visit: Payer: Medicare HMO | Attending: Physician Assistant | Admitting: Physician Assistant

## 2022-12-29 ENCOUNTER — Encounter: Payer: Self-pay | Admitting: Physician Assistant

## 2022-12-29 ENCOUNTER — Ambulatory Visit
Admission: RE | Admit: 2022-12-29 | Discharge: 2022-12-29 | Disposition: A | Discharge status: Home / Self Care | Payer: Medicare HMO | Source: Ambulatory Visit | Attending: Family Medicine | Admitting: Family Medicine

## 2022-12-29 DIAGNOSIS — I5030 Unspecified diastolic (congestive) heart failure: Secondary | ICD-10-CM | POA: Diagnosis not present

## 2022-12-29 DIAGNOSIS — E7800 Pure hypercholesterolemia, unspecified: Secondary | ICD-10-CM | POA: Diagnosis not present

## 2022-12-29 DIAGNOSIS — Z780 Asymptomatic menopausal state: Secondary | ICD-10-CM

## 2022-12-29 DIAGNOSIS — E2839 Other primary ovarian failure: Secondary | ICD-10-CM | POA: Diagnosis not present

## 2022-12-29 DIAGNOSIS — N958 Other specified menopausal and perimenopausal disorders: Secondary | ICD-10-CM | POA: Diagnosis not present

## 2022-12-29 DIAGNOSIS — E782 Mixed hyperlipidemia: Secondary | ICD-10-CM | POA: Diagnosis not present

## 2022-12-29 DIAGNOSIS — Z90722 Acquired absence of ovaries, bilateral: Secondary | ICD-10-CM | POA: Diagnosis not present

## 2022-12-29 MED ORDER — FUROSEMIDE 40 MG PO TABS: ORAL_TABLET | ORAL | 3 refills | Status: DC

## 2022-12-29 MED ORDER — ROSUVASTATIN CALCIUM 10 MG PO TABS: 10.0000 mg | ORAL_TABLET | Freq: Every day | ORAL | 3 refills | Status: DC

## 2022-12-29 MED ORDER — LISINOPRIL 40 MG PO TABS: 40.0000 mg | ORAL_TABLET | Freq: Every day | ORAL | 3 refills | Status: DC

## 2022-12-29 NOTE — Patient Instructions (Signed)
Medication Instructions:  CHANGE Furosemide to 40mg  daily and additional 1/2 tablet as needed for swelling *If you need a refill on your cardiac medications before your next appointment, please call your pharmacy*  Follow-Up: At [facility], you and your health needs are our priority.  As part of our continuing mission to provide you with exceptional heart care, we have created designated Provider Care Teams.  These Care Teams include your primary Cardiologist (physician) and Advanced Practice Providers (APPs -  Physician Assistants and Nurse Practitioners) who all work together to provide you with the care you need, when you need it.  Your next appointment:   1 year(s)  Provider:   Christell Constant, MD     Other Instructions **Please check blood pressure at home 2-3 times per week and send readings to office in a few weeks**

## 2023-01-06 DIAGNOSIS — E1122 Type 2 diabetes mellitus with diabetic chronic kidney disease: Secondary | ICD-10-CM | POA: Diagnosis not present

## 2023-01-06 DIAGNOSIS — E785 Hyperlipidemia, unspecified: Secondary | ICD-10-CM | POA: Diagnosis not present

## 2023-01-06 DIAGNOSIS — K219 Gastro-esophageal reflux disease without esophagitis: Secondary | ICD-10-CM | POA: Diagnosis not present

## 2023-01-06 DIAGNOSIS — N1832 Chronic kidney disease, stage 3b: Secondary | ICD-10-CM | POA: Diagnosis not present

## 2023-01-06 DIAGNOSIS — M1990 Unspecified osteoarthritis, unspecified site: Secondary | ICD-10-CM | POA: Diagnosis not present

## 2023-01-06 DIAGNOSIS — J45909 Unspecified asthma, uncomplicated: Secondary | ICD-10-CM | POA: Diagnosis not present

## 2023-01-06 DIAGNOSIS — I509 Heart failure, unspecified: Secondary | ICD-10-CM | POA: Diagnosis not present

## 2023-01-06 DIAGNOSIS — R32 Unspecified urinary incontinence: Secondary | ICD-10-CM | POA: Diagnosis not present

## 2023-01-06 DIAGNOSIS — M545 Low back pain, unspecified: Secondary | ICD-10-CM | POA: Diagnosis not present

## 2023-01-06 DIAGNOSIS — M4800 Spinal stenosis, site unspecified: Secondary | ICD-10-CM | POA: Diagnosis not present

## 2023-01-06 DIAGNOSIS — R269 Unspecified abnormalities of gait and mobility: Secondary | ICD-10-CM | POA: Diagnosis not present

## 2023-01-06 DIAGNOSIS — K5900 Constipation, unspecified: Secondary | ICD-10-CM | POA: Diagnosis not present

## 2023-01-08 ENCOUNTER — Other Ambulatory Visit: Payer: Self-pay

## 2023-01-08 DIAGNOSIS — H938X1 Other specified disorders of right ear: Secondary | ICD-10-CM

## 2023-01-08 MED ORDER — FLUTICASONE PROPIONATE 50 MCG/ACT NA SUSP: 2.0000 | Freq: Every day | NASAL | 2 refills | Status: AC

## 2023-01-08 NOTE — Telephone Encounter (Signed)
Requested Prescriptions  Pending Prescriptions Disp Refills   fluticasone (FLONASE) 50 MCG/ACT nasal spray 16 g 6    Sig: Place 2 sprays into both nostrils daily.     Ear, Nose, and Throat: Nasal Preparations - Corticosteroids Failed - 01/08/2023 11:40 AM      Failed - Valid encounter within last 12 months    Recent Outpatient Visits           1 year ago Change in bowel habits   [facility] Medicine Pickard, Priscille Heidelberg, MD   1 year ago Ingrown thumb nail, right   [facility] Medicine Pickard, Priscille Heidelberg, MD   2 years ago General medical exam   [facility] Family Medicine Donita Brooks, MD   2 years ago Sinus pressure   [facility] Medicine Valentino Nose, NP   3 years ago Spinal stenosis of lumbar region at multiple levels   [facility] Medicine Pickard, Priscille Heidelberg, MD

## 2023-01-08 NOTE — Telephone Encounter (Signed)
Prescription Request  01/08/2023  LOV: 10/16/22  What is the name of the medication or equipment? fluticasone (FLONASE) 50 MCG/ACT nasal spray [[id number]]   Have you contacted your pharmacy to request a refill? Yes   Which pharmacy would you like this sent to?  [facility] - [place] ([place]), [place] - [address] [place] ([place]) [place] [address] Phone: [phone] Fax: [phone]    Patient notified that their request is being sent to the clinical staff for review and that they should receive a response within 2 business days.   Please advise at [facility] [phone]

## 2023-01-26 ENCOUNTER — Telehealth: Payer: Self-pay | Admitting: Internal Medicine

## 2023-01-26 DIAGNOSIS — M5416 Radiculopathy, lumbar region: Secondary | ICD-10-CM | POA: Diagnosis not present

## 2023-01-26 NOTE — Telephone Encounter (Signed)
Paper Work Dropped Off: pt assistance paperwork  Date:01/26/2023  Location of paper:  dr.Chandrasekhar mailbox

## 2023-02-04 ENCOUNTER — Other Ambulatory Visit: Payer: Self-pay | Admitting: Family Medicine

## 2023-02-04 DIAGNOSIS — J209 Acute bronchitis, unspecified: Secondary | ICD-10-CM

## 2023-02-05 NOTE — Telephone Encounter (Signed)
 Called pt advised MD is not in the office this week but I will have him sign paperwork on Monday.  Pt would like me to fax paperwork.

## 2023-02-09 NOTE — Telephone Encounter (Signed)
 Obtained MD signature Jardiance  pt assistance application faxed to [phone].  Confirmation attached here:     -------Fax Transmission Report-------  To:               Recipient at [phone] Subject:          Pt assistance Result:           The transmission was successful. Explanation:      All Pages Ok Pages Sent:       6 Connect Time:     3 minutes, 45 seconds Transmit Time:    02/09/2023 08:33

## 2023-02-16 ENCOUNTER — Telehealth: Payer: Self-pay

## 2023-02-16 NOTE — Telephone Encounter (Signed)
Received, thank you

## 2023-02-16 NOTE — Telephone Encounter (Addendum)
PAP: Patient assistance application Jardiance for has been approved by PAP Companies: [employer] from 02/14/23 to 01/27/24. Medication should be delivered to PAP Delivery: Home For further shipping updates, please contact [employer] at [phone] Pt ID is: [id number]

## 2023-03-23 DIAGNOSIS — M5416 Radiculopathy, lumbar region: Secondary | ICD-10-CM | POA: Diagnosis not present

## 2023-04-09 ENCOUNTER — Other Ambulatory Visit: Payer: Self-pay | Admitting: Family Medicine

## 2023-04-09 DIAGNOSIS — I10 Essential (primary) hypertension: Secondary | ICD-10-CM

## 2023-04-09 NOTE — Telephone Encounter (Signed)
 Requested Prescriptions  Pending Prescriptions Disp Refills   doxazosin (CARDURA) 2 MG tablet [Pharmacy Med Name: Doxazosin Mesylate 2 MG Oral Tablet] 30 tablet 0    Sig: Take 1 tablet by mouth once daily     Cardiovascular:  Alpha Blockers Failed - 04/09/2023  3:52 PM      Failed - Valid encounter within last 6 months    Recent Outpatient Visits           1 year ago Change in bowel habits   [facility] Medicine Donita Brooks, MD   2 years ago Ingrown thumb nail, right   [facility] Medicine Pickard, Priscille Heidelberg, MD   2 years ago General medical exam   [facility] Medicine Donita Brooks, MD   2 years ago Sinus pressure   [facility] Medicine Valentino Nose, NP   3 years ago Spinal stenosis of lumbar region at multiple levels   [facility] Medicine Pickard, Priscille Heidelberg, MD              Passed - Last BP in normal range    BP Readings from Last 1 Encounters:  11/20/22 115/74          Courtesy refill.

## 2023-04-13 ENCOUNTER — Other Ambulatory Visit: Payer: Self-pay | Admitting: Family Medicine

## 2023-04-13 DIAGNOSIS — J209 Acute bronchitis, unspecified: Secondary | ICD-10-CM

## 2023-04-15 NOTE — Telephone Encounter (Signed)
 Requested medication (s) are due for refill today: yes  Requested medication (s) are on the active medication list: yes  Last refill:  02/04/23  Future visit scheduled: no  Notes to clinic:  Unable to refill per protocol, courtesy refill already given, routing for provider approval.      Requested Prescriptions  Pending Prescriptions Disp Refills   albuterol (VENTOLIN HFA) 108 (90 Base) MCG/ACT inhaler [Pharmacy Med Name: Albuterol Sulfate HFA 108 (90 Base) MCG/ACT Inhalation Aerosol Solution] 9 g 0    Sig: INHALE 2 PUFFS BY MOUTH EVERY 4 HOURS AS NEEDED FOR WHEEZING FOR SHORTNESS OF BREATH     Pulmonology:  Beta Agonists 2 Failed - 04/15/2023  8:46 AM      Failed - Valid encounter within last 12 months    Recent Outpatient Visits           1 year ago Change in bowel habits   [facility] Medicine Pickard, Priscille Heidelberg, MD   2 years ago Ingrown thumb nail, right   [facility] Medicine Pickard, Priscille Heidelberg, MD   2 years ago General medical exam   [facility] Family Medicine Donita Brooks, MD   2 years ago Sinus pressure   [facility] Medicine Valentino Nose, NP   3 years ago Spinal stenosis of lumbar region at multiple levels   [facility] Medicine Pickard, Priscille Heidelberg, MD              Passed - Last BP in normal range    BP Readings from Last 1 Encounters:  11/20/22 115/74         Passed - Last Heart Rate in normal range    Pulse Readings from Last 1 Encounters:  11/20/22 74

## 2023-04-16 ENCOUNTER — Other Ambulatory Visit: Payer: Self-pay | Admitting: Family Medicine

## 2023-04-16 DIAGNOSIS — Z1231 Encounter for screening mammogram for malignant neoplasm of breast: Secondary | ICD-10-CM

## 2023-04-17 ENCOUNTER — Other Ambulatory Visit: Payer: Self-pay | Admitting: Family Medicine

## 2023-04-17 DIAGNOSIS — J209 Acute bronchitis, unspecified: Secondary | ICD-10-CM

## 2023-04-17 NOTE — Telephone Encounter (Signed)
 Copied from CRM [id number]. Topic: Clinical - Medication Refill >> Apr 17, 2023  3:44 PM Clayton Bibles wrote: Most Recent Primary Care Visit:  Provider: Venia Carbon K  Department[facility]  Visit Type: NURSE VISIT  Date: 10/29/2022  Medication: albuterol (VENTOLIN HFA) 108 (90 Base) MCG/ACT inhaler   Has the patient contacted their pharmacy? Yes (Agent: If no, request that the patient contact the pharmacy for the refill. If patient does not wish to contact the pharmacy document the reason why and proceed with request.) (Agent: If yes, when and what did the pharmacy advise?)  Pharmacy said Dr. Tanya Nones denied refill. I do not show any notes in chart or no end date. Please send an order to refill or call Maiyah if there is an issue.    Is this the correct pharmacy for this prescription? Yes If no, delete pharmacy and type the correct one.  This is the patient's preferred pharmacy:  [facility] - [place] ([place]), [place] - [address] [place] ([place]) [place] [address] Phone: [phone] Fax: [phone]   Has the prescription been filled recently? No  Is the patient out of the medication? No - She will run out of medication by tomorrow.   Has the patient been seen for an appointment in the last year OR does the patient have an upcoming appointment? Yes  Can we respond through MyChart? Yes  Agent: Please be advised that Rx refills may take up to 3 business days. We ask that you follow-up with your pharmacy.

## 2023-04-18 ENCOUNTER — Other Ambulatory Visit: Payer: Self-pay | Admitting: Family Medicine

## 2023-04-18 DIAGNOSIS — J209 Acute bronchitis, unspecified: Secondary | ICD-10-CM

## 2023-04-20 ENCOUNTER — Telehealth: Payer: Self-pay | Admitting: Family Medicine

## 2023-04-20 ENCOUNTER — Other Ambulatory Visit: Payer: Self-pay

## 2023-04-20 DIAGNOSIS — J209 Acute bronchitis, unspecified: Secondary | ICD-10-CM

## 2023-04-20 MED ORDER — ALBUTEROL SULFATE HFA 108 (90 BASE) MCG/ACT IN AERS: 2.0000 | INHALATION_SPRAY | RESPIRATORY_TRACT | 0 refills | Status: DC | PRN

## 2023-04-20 NOTE — Telephone Encounter (Signed)
 Requested medications are due for refill today.  yes  Requested medications are on the active medications list.  yes  Last refill. 02/04/2023 9 g 0 rf  Future visit scheduled.   no  Notes to clinic.  Pt needs an appt.     Requested Prescriptions  Pending Prescriptions Disp Refills   albuterol (VENTOLIN HFA) 108 (90 Base) MCG/ACT inhaler 9 g 0     Pulmonology:  Beta Agonists 2 Failed - 04/20/2023 11:48 AM      Failed - Valid encounter within last 12 months    Recent Outpatient Visits           1 year ago Change in bowel habits   [facility] Medicine Pickard, Priscille Heidelberg, MD   2 years ago Ingrown thumb nail, right   [facility] Medicine Pickard, Priscille Heidelberg, MD   2 years ago General medical exam   [facility] Family Medicine Donita Brooks, MD   2 years ago Sinus pressure   [facility] Medicine Valentino Nose, NP   3 years ago Spinal stenosis of lumbar region at multiple levels   [facility] Medicine Pickard, Priscille Heidelberg, MD              Passed - Last BP in normal range    BP Readings from Last 1 Encounters:  11/20/22 115/74         Passed - Last Heart Rate in normal range    Pulse Readings from Last 1 Encounters:  11/20/22 74

## 2023-04-20 NOTE — Telephone Encounter (Signed)
 Prescription Request  04/20/2023  LOV: 10/16/2022  What is the name of the medication or equipment?   albuterol (VENTOLIN HFA) 108 (90 Base) MCG/ACT inhaler   Have you contacted your pharmacy to request a refill? Yes   Which pharmacy would you like this sent to?  [facility] - [place] ([place]), [place] - [address] [place] ([place]) [place] [address] Phone: [phone] Fax: [phone]    Patient notified that their request is being sent to the clinical staff for review and that they should receive a response within 2 business days.   Please advise pharmacist.

## 2023-04-20 NOTE — Telephone Encounter (Signed)
 Requested Prescriptions  Refused Prescriptions Disp Refills   albuterol (VENTOLIN HFA) 108 (90 Base) MCG/ACT inhaler [Pharmacy Med Name: Albuterol Sulfate HFA 108 (90 Base) MCG/ACT Inhalation Aerosol Solution] 9 g 0    Sig: INHALE 2 PUFFS BY MOUTH EVERY 4 HOURS AS NEEDED FOR WHEEZING FOR SHORTNESS OF BREATH     Pulmonology:  Beta Agonists 2 Failed - 04/20/2023  3:55 PM      Failed - Valid encounter within last 12 months    Recent Outpatient Visits           1 year ago Change in bowel habits   [facility] Medicine Pickard, Priscille Heidelberg, MD   2 years ago Ingrown thumb nail, right   [facility] Medicine Pickard, Priscille Heidelberg, MD   2 years ago General medical exam   [facility] Donita Brooks, MD   2 years ago Sinus pressure   [facility] Medicine Valentino Nose, NP   3 years ago Spinal stenosis of lumbar region at multiple levels   [facility] Medicine Pickard, Priscille Heidelberg, MD              Passed - Last BP in normal range    BP Readings from Last 1 Encounters:  11/20/22 115/74         Passed - Last Heart Rate in normal range    Pulse Readings from Last 1 Encounters:  11/20/22 74

## 2023-05-04 DIAGNOSIS — M5416 Radiculopathy, lumbar region: Secondary | ICD-10-CM | POA: Diagnosis not present

## 2023-05-14 ENCOUNTER — Ambulatory Visit
Admission: RE | Admit: 2023-05-14 | Discharge: 2023-05-14 | Disposition: A | Discharge status: Home / Self Care | Source: Ambulatory Visit | Attending: Family Medicine | Admitting: Family Medicine

## 2023-05-14 DIAGNOSIS — Z1231 Encounter for screening mammogram for malignant neoplasm of breast: Secondary | ICD-10-CM

## 2023-05-19 ENCOUNTER — Ambulatory Visit: Admitting: Family Medicine

## 2023-05-21 ENCOUNTER — Encounter: Payer: Self-pay | Admitting: Family Medicine

## 2023-05-21 ENCOUNTER — Ambulatory Visit: Admitting: Family Medicine

## 2023-05-21 VITALS — BP 120/72 | HR 64 | Temp 98.4°F | Ht 64.0 in | Wt 251.6 lb

## 2023-05-21 DIAGNOSIS — M1990 Unspecified osteoarthritis, unspecified site: Secondary | ICD-10-CM | POA: Diagnosis not present

## 2023-05-21 DIAGNOSIS — M25562 Pain in left knee: Secondary | ICD-10-CM

## 2023-05-21 DIAGNOSIS — I5030 Unspecified diastolic (congestive) heart failure: Secondary | ICD-10-CM | POA: Diagnosis not present

## 2023-05-21 DIAGNOSIS — G8929 Other chronic pain: Secondary | ICD-10-CM

## 2023-05-21 DIAGNOSIS — I5189 Other ill-defined heart diseases: Secondary | ICD-10-CM

## 2023-05-21 DIAGNOSIS — M25561 Pain in right knee: Secondary | ICD-10-CM

## 2023-05-21 MED ORDER — TRAMADOL HCL 50 MG PO TABS: 50.0000 mg | ORAL_TABLET | Freq: Three times a day (TID) | ORAL | 0 refills | Status: DC | PRN

## 2023-05-21 NOTE — Progress Notes (Signed)
 Subjective:    Patient ID: [name], female    DOB: [birth year], 78 y.o.   MRN: [id number]  HPI 07/26/21 Patient is here today with swelling in her legs.  She has positive edema distal to the knees.  Her feet are swollen and puffy.  She denies any chest pain or shortness of breath.  She does have a history of congestive heart failure with preserved ejection fraction.  Cardiology recently started her on Jardiance  to help address this.  She denies any orthopnea or paroxysmal nocturnal dyspnea.  At that time, my plan was: Patient appears fluid overloaded.  Begin Lasix  40 mg twice daily.  Add K dur 20 mill equivalents once daily, continue Jardiance , recheck swelling in 1 week or sooner if worsening.  12/24/21 Patient has CHF with preserved EF appropriately treated with Jardiance .  Diuretics have been maintained although Cr recently rose to 1.44 in September.  Due to recheck today.  Currently on lasix  60 bid and potassium 40 meq poqday but not spironolactone .    05/21/23 Patient presents today follow-up.  She is currently on Jardiance . she cannot tolerate spironolactone  due to a rash.  She is overdue to check renal function and potassium.  She denies any chest pain or shortness of breath however she does have significant edema in her extremities.  She has +1 pitting edema in both ankles left slightly greater than right.  The edema progressed to the left knees.  She denies any orthopnea.  She denies any paroxysmal nocturnal dyspnea. Past Medical History:  Diagnosis Date   Allergy    Arthritis    arthritis -knees   Colon polyps    benign- last colonoscopy 09-2008   Depression    GERD (gastroesophageal reflux disease)    hx GERD   H/O seasonal allergies    no problems now   Hyperlipidemia    Hypertension    Murmur, heart    Spinal stenosis of lumbar region at multiple levels    Past Surgical History:  Procedure Laterality Date   ABDOMINAL HYSTERECTOMY     APPENDECTOMY     with  gallbladder surgery   BUNIONECTOMY Bilateral    CHOLECYSTECTOMY     open-gallstones   COLONOSCOPY     EXCISION OF ADNEXAL MASS Left August 03, 2013   at [facility]/right adnexal nodule,excision;benigh lymph node   OOPHORECTOMY Bilateral 2008   ovarian cyst   POLYPECTOMY     Current Outpatient Medications on File Prior to Visit  Medication Sig Dispense Refill   acetaminophen  (TYLENOL ) 650 MG CR tablet Take by mouth as needed for pain.     albuterol  (VENTOLIN  HFA) 108 (90 Base) MCG/ACT inhaler Inhale 2 puffs into the lungs every 4 (four) hours as needed for wheezing or shortness of breath. 9 g 0   diltiazem  (CARDIZEM  CD) 360 MG 24 hr capsule Take 1 capsule (360 mg total) by mouth daily. 90 capsule 1   doxazosin  (CARDURA ) 2 MG tablet Take 1 tablet by mouth once daily 30 tablet 0   empagliflozin  (JARDIANCE ) 10 MG TABS tablet Take 1 tablet (10 mg total) by mouth daily before breakfast. 90 tablet 2   fluticasone  (FLONASE ) 50 MCG/ACT nasal spray Place 2 sprays into both nostrils daily. 16 g 2   furosemide  (LASIX ) 40 MG tablet Take 1 tablet by mouth daily. May take additional half tablet (20mg ) as needed for swelling 135 tablet 3   lisinopril  (ZESTRIL ) 40 MG tablet Take 1 tablet (40 mg total) by mouth  daily. 90 tablet 3   loratadine (CLARITIN) 10 MG tablet Take 1 tablet by mouth daily.     Multiple Vitamins-Minerals (CENTRUM MINIS WOMEN 50+) TABS Take by mouth daily at 6 (six) AM.     omeprazole  (PRILOSEC) 40 MG capsule Take 1 capsule (40 mg total) by mouth daily. 90 capsule 3   Polyethylene Glycol 3350  (MIRALAX PO) Take by mouth as needed.     potassium chloride  SA (KLOR-CON  M) 20 MEQ tablet Take 1 tablet (20 mEq total) by mouth daily. 30 tablet 3   Probiotic Product (PROBIOTIC DAILY PO) Take 1 capsule by mouth daily at 6 (six) AM. EARTH'S PEARL SUPPLEMENT     rosuvastatin  (CRESTOR ) 10 MG tablet Take 1 tablet (10 mg total) by mouth daily. 90 tablet 3   No current facility-administered medications  on file prior to visit.   Allergies  Allergen Reactions   Aldactone  [Spironolactone ] Rash   Social History   Socioeconomic History   Marital status: Married    Spouse name: Not on file   Number of children: Not on file   Years of education: Not on file   Highest education level: Not on file  Occupational History   Not on file  Tobacco Use   Smoking status: Never   Smokeless tobacco: Never  Vaping Use   Vaping status: Never Used  Substance and Sexual Activity   Alcohol use: No   Drug use: No   Sexual activity: Yes  Other Topics Concern   Not on file  Social History Narrative   Not on file   Social Drivers of Health   Financial Resource Strain: Low Risk  (06/19/2022)   Overall Financial Resource Strain (CARDIA)    Difficulty of Paying Living Expenses: Not hard at all  Food Insecurity: No Food Insecurity (06/19/2022)   Hunger Vital Sign    Worried About Running Out of Food in the Last Year: Never true    Ran Out of Food in the Last Year: Never true  Transportation Needs: No Transportation Needs (06/19/2022)   PRAPARE - Administrator, Civil Service (Medical): No    Lack of Transportation (Non-Medical): No  Physical Activity: Inactive (06/19/2022)   Exercise Vital Sign    Days of Exercise per Week: 0 days    Minutes of Exercise per Session: 0 min  Stress: No Stress Concern Present (06/19/2022)   [employer] of Occupational Health - Occupational Stress Questionnaire    Feeling of Stress : Only a little  Social Connections: Socially Integrated (06/19/2022)   Social Connection and Isolation Panel [NHANES]    Frequency of Communication with Friends and Family: More than three times a week    Frequency of Social Gatherings with Friends and Family: Three times a week    Attends Religious Services: More than 4 times per year    Active Member of Clubs or Organizations: Yes    Attends Banker Meetings: More than 4 times per year    Marital Status:  Married  Catering manager Violence: Not At Risk (06/19/2022)   Humiliation, Afraid, Rape, and Kick questionnaire    Fear of Current or Ex-Partner: No    Emotionally Abused: No    Physically Abused: No    Sexually Abused: No      Review of Systems  All other systems reviewed and are negative.      Objective:   Physical Exam Vitals reviewed.  Constitutional:      Appearance: Normal appearance.  Cardiovascular:     Rate and Rhythm: Normal rate and regular rhythm.     Pulses: Normal pulses.     Heart sounds: Murmur heard.  Pulmonary:     Effort: Pulmonary effort is normal. No respiratory distress.     Breath sounds: Normal breath sounds. No stridor. No wheezing, rhonchi or rales.  Chest:     Chest wall: No tenderness.  Musculoskeletal:     Right lower leg: Edema present.     Left lower leg: Edema present.  Neurological:     Mental Status: She is alert.         Assessment & Plan:  Arthritis - Plan: traMADol  (ULTRAM ) 50 MG tablet  Chronic pain of both knees - Plan: traMADol  (ULTRAM ) 50 MG tablet  Diastolic dysfunction - Plan: CBC with Differential/Platelet, COMPLETE METABOLIC PANEL WITHOUT GFR, Brain natriuretic peptide Patient appears fluid overloaded today.  Increase Lasix  to 60 mg a day.  Check CBC CMP and BNP.  Patient is unable to tolerate spironolactone  and is already on Jardiance .  She denies any chest pain orthopnea or syncope.  She does have frequent PVCs auscultated today on exam

## 2023-05-22 DIAGNOSIS — M25562 Pain in left knee: Secondary | ICD-10-CM | POA: Diagnosis not present

## 2023-05-22 DIAGNOSIS — M170 Bilateral primary osteoarthritis of knee: Secondary | ICD-10-CM | POA: Diagnosis not present

## 2023-05-22 DIAGNOSIS — M25561 Pain in right knee: Secondary | ICD-10-CM | POA: Diagnosis not present

## 2023-05-22 LAB — CBC WITH DIFFERENTIAL/PLATELET
Absolute Lymphocytes: 1555 {cells}/uL (ref 850–3900)
Absolute Monocytes: 606 {cells}/uL (ref 200–950)
Basophils Absolute: 51 {cells}/uL (ref 0–200)
Basophils Relative: 0.7 %
Eosinophils Absolute: 285 {cells}/uL (ref 15–500)
Eosinophils Relative: 3.9 %
HCT: 40.8 % (ref 35.0–45.0)
Hemoglobin: 13.3 g/dL (ref 11.7–15.5)
MCH: 28.4 pg (ref 27.0–33.0)
MCHC: 32.6 g/dL (ref 32.0–36.0)
MCV: 87.2 fL (ref 80.0–100.0)
MPV: 12.4 fL (ref 7.5–12.5)
Monocytes Relative: 8.3 %
Neutro Abs: 4803 {cells}/uL (ref 1500–7800)
Neutrophils Relative %: 65.8 %
Platelets: 231 10*3/uL (ref 140–400)
RBC: 4.68 10*6/uL (ref 3.80–5.10)
RDW: 12.7 % (ref 11.0–15.0)
Total Lymphocyte: 21.3 %
WBC: 7.3 10*3/uL (ref 3.8–10.8)

## 2023-05-22 LAB — COMPLETE METABOLIC PANEL WITHOUT GFR
AG Ratio: 1.8 (calc) (ref 1.0–2.5)
ALT: 6 U/L (ref 6–29)
AST: 15 U/L (ref 10–35)
Albumin: 4.1 g/dL (ref 3.6–5.1)
Alkaline phosphatase (APISO): 41 U/L (ref 37–153)
BUN: 13 mg/dL (ref 7–25)
CO2: 24 mmol/L (ref 20–32)
Calcium: 9.5 mg/dL (ref 8.6–10.4)
Chloride: 101 mmol/L (ref 98–110)
Creat: 0.98 mg/dL (ref 0.60–1.00)
Globulin: 2.3 g/dL (ref 1.9–3.7)
Glucose, Bld: 122 mg/dL — ABNORMAL HIGH (ref 65–99)
Potassium: 3.6 mmol/L (ref 3.5–5.3)
Sodium: 143 mmol/L (ref 135–146)
Total Bilirubin: 0.3 mg/dL (ref 0.2–1.2)
Total Protein: 6.4 g/dL (ref 6.1–8.1)

## 2023-05-22 LAB — BRAIN NATRIURETIC PEPTIDE: Brain Natriuretic Peptide: 40 pg/mL (ref <100)

## 2023-05-25 ENCOUNTER — Other Ambulatory Visit: Payer: Self-pay | Admitting: Family Medicine

## 2023-05-25 DIAGNOSIS — G8929 Other chronic pain: Secondary | ICD-10-CM

## 2023-05-25 DIAGNOSIS — M1990 Unspecified osteoarthritis, unspecified site: Secondary | ICD-10-CM

## 2023-05-25 DIAGNOSIS — I10 Essential (primary) hypertension: Secondary | ICD-10-CM

## 2023-05-27 ENCOUNTER — Other Ambulatory Visit: Payer: Self-pay | Admitting: Family Medicine

## 2023-05-27 DIAGNOSIS — I10 Essential (primary) hypertension: Secondary | ICD-10-CM

## 2023-05-30 ENCOUNTER — Other Ambulatory Visit: Payer: Self-pay | Admitting: Family Medicine

## 2023-05-30 DIAGNOSIS — I5032 Chronic diastolic (congestive) heart failure: Secondary | ICD-10-CM

## 2023-05-30 DIAGNOSIS — I10 Essential (primary) hypertension: Secondary | ICD-10-CM

## 2023-06-04 DIAGNOSIS — I10 Essential (primary) hypertension: Secondary | ICD-10-CM | POA: Diagnosis not present

## 2023-06-04 DIAGNOSIS — Z9079 Acquired absence of other genital organ(s): Secondary | ICD-10-CM | POA: Diagnosis not present

## 2023-06-04 DIAGNOSIS — Z01419 Encounter for gynecological examination (general) (routine) without abnormal findings: Secondary | ICD-10-CM | POA: Diagnosis not present

## 2023-06-04 DIAGNOSIS — Z90710 Acquired absence of both cervix and uterus: Secondary | ICD-10-CM | POA: Diagnosis not present

## 2023-06-11 ENCOUNTER — Encounter: Payer: Self-pay | Admitting: Family Medicine

## 2023-06-11 ENCOUNTER — Ambulatory Visit: Admitting: Family Medicine

## 2023-06-11 ENCOUNTER — Ambulatory Visit: Payer: Self-pay

## 2023-06-11 VITALS — BP 120/64 | HR 73 | Temp 98.1°F | Ht 64.0 in | Wt 250.0 lb

## 2023-06-11 DIAGNOSIS — L209 Atopic dermatitis, unspecified: Secondary | ICD-10-CM | POA: Diagnosis not present

## 2023-06-11 DIAGNOSIS — E785 Hyperlipidemia, unspecified: Secondary | ICD-10-CM | POA: Diagnosis not present

## 2023-06-11 DIAGNOSIS — R32 Unspecified urinary incontinence: Secondary | ICD-10-CM | POA: Diagnosis not present

## 2023-06-11 DIAGNOSIS — J4489 Other specified chronic obstructive pulmonary disease: Secondary | ICD-10-CM | POA: Diagnosis not present

## 2023-06-11 DIAGNOSIS — Z8249 Family history of ischemic heart disease and other diseases of the circulatory system: Secondary | ICD-10-CM | POA: Diagnosis not present

## 2023-06-11 DIAGNOSIS — K219 Gastro-esophageal reflux disease without esophagitis: Secondary | ICD-10-CM | POA: Diagnosis not present

## 2023-06-11 DIAGNOSIS — Z008 Encounter for other general examination: Secondary | ICD-10-CM | POA: Diagnosis not present

## 2023-06-11 DIAGNOSIS — E1136 Type 2 diabetes mellitus with diabetic cataract: Secondary | ICD-10-CM | POA: Diagnosis not present

## 2023-06-11 DIAGNOSIS — J309 Allergic rhinitis, unspecified: Secondary | ICD-10-CM | POA: Diagnosis not present

## 2023-06-11 DIAGNOSIS — I110 Hypertensive heart disease with heart failure: Secondary | ICD-10-CM | POA: Diagnosis not present

## 2023-06-11 DIAGNOSIS — Z809 Family history of malignant neoplasm, unspecified: Secondary | ICD-10-CM | POA: Diagnosis not present

## 2023-06-11 DIAGNOSIS — I509 Heart failure, unspecified: Secondary | ICD-10-CM | POA: Diagnosis not present

## 2023-06-11 DIAGNOSIS — Z7984 Long term (current) use of oral hypoglycemic drugs: Secondary | ICD-10-CM | POA: Diagnosis not present

## 2023-06-11 LAB — MICROALBUMIN / CREATININE URINE RATIO: Microalb Creat Ratio: 43

## 2023-06-11 MED ORDER — MOMETASONE FUROATE 0.1 % EX CREA: TOPICAL_CREAM | Freq: Two times a day (BID) | CUTANEOUS | 1 refills | Status: AC

## 2023-06-11 NOTE — Progress Notes (Signed)
 Subjective:    Patient ID: [name], female    DOB: 1945-03-05, 78 y.o.   MRN: [id number]  HPI Patient has a rash behind both knees.  The rash is a circular patch of erythematous papules coalescing into a large plaque.  Left knee is worse than the right knee.  It appears to be a papulosquamous eruption similar to eczema.  It is limited to the popliteal fossa of both legs.  There is no evidence of psoriasis.  There is no silvery scale.  There is no scale. Past Medical History:  Diagnosis Date   Allergy    Arthritis    arthritis -knees   Colon polyps    benign- last colonoscopy 09-2008   Depression    GERD (gastroesophageal reflux disease)    hx GERD   H/O seasonal allergies    no problems now   Hyperlipidemia    Hypertension    Murmur, heart    Spinal stenosis of lumbar region at multiple levels    Past Surgical History:  Procedure Laterality Date   ABDOMINAL HYSTERECTOMY     APPENDECTOMY     with gallbladder surgery   BUNIONECTOMY Bilateral    CHOLECYSTECTOMY     open-gallstones   COLONOSCOPY     EXCISION OF ADNEXAL MASS Left August 03, 2013   at [facility]/right adnexal nodule,excision;benigh lymph node   OOPHORECTOMY Bilateral 2008   ovarian cyst   POLYPECTOMY     Current Outpatient Medications on File Prior to Visit  Medication Sig Dispense Refill   acetaminophen  (TYLENOL ) 650 MG CR tablet Take by mouth as needed for pain.     albuterol  (VENTOLIN  HFA) 108 (90 Base) MCG/ACT inhaler Inhale 2 puffs into the lungs every 4 (four) hours as needed for wheezing or shortness of breath. 9 g 0   diltiazem  (CARDIZEM  CD) 360 MG 24 hr capsule Take 1 capsule by mouth once daily 90 capsule 0   doxazosin  (CARDURA ) 2 MG tablet TAKE 1 TABLET BY MOUTH ONCE DAILY . APPOINTMENT REQUIRED FOR FUTURE REFILLS 30 tablet 0   empagliflozin  (JARDIANCE ) 10 MG TABS tablet Take 1 tablet (10 mg total) by mouth daily before breakfast. 90 tablet 2   fluticasone  (FLONASE ) 50 MCG/ACT nasal spray Place  2 sprays into both nostrils daily. 16 g 2   furosemide  (LASIX ) 40 MG tablet Take 1 tablet by mouth daily. May take additional half tablet (20mg ) as needed for swelling 135 tablet 3   lisinopril  (ZESTRIL ) 40 MG tablet Take 1 tablet (40 mg total) by mouth daily. 90 tablet 3   loratadine (CLARITIN) 10 MG tablet Take 1 tablet by mouth daily.     Multiple Vitamins-Minerals (CENTRUM MINIS WOMEN 50+) TABS Take by mouth daily at 6 (six) AM.     omeprazole  (PRILOSEC) 40 MG capsule Take 1 capsule (40 mg total) by mouth daily. 90 capsule 3   Polyethylene Glycol 3350  (MIRALAX PO) Take by mouth as needed.     potassium chloride  SA (KLOR-CON  M) 20 MEQ tablet Take 1 tablet (20 mEq total) by mouth daily. 30 tablet 3   Probiotic Product (PROBIOTIC DAILY PO) Take 1 capsule by mouth daily at 6 (six) AM. EARTH'S PEARL SUPPLEMENT     rosuvastatin  (CRESTOR ) 10 MG tablet Take 1 tablet (10 mg total) by mouth daily. 90 tablet 3   traMADol  (ULTRAM ) 50 MG tablet Take 1 tablet (50 mg total) by mouth every 8 (eight) hours as needed. 30 tablet 0   No current  facility-administered medications on file prior to visit.   Allergies  Allergen Reactions   Aldactone  [Spironolactone ] Rash   Social History   Socioeconomic History   Marital status: Married    Spouse name: Not on file   Number of children: Not on file   Years of education: Not on file   Highest education level: Not on file  Occupational History   Not on file  Tobacco Use   Smoking status: Never   Smokeless tobacco: Never  Vaping Use   Vaping status: Never Used  Substance and Sexual Activity   Alcohol use: No   Drug use: No   Sexual activity: Yes  Other Topics Concern   Not on file  Social History Narrative   Not on file   Social Drivers of Health   Financial Resource Strain: Low Risk  (06/19/2022)   Overall Financial Resource Strain (CARDIA)    Difficulty of Paying Living Expenses: Not hard at all  Food Insecurity: No Food Insecurity (06/19/2022)    Hunger Vital Sign    Worried About Running Out of Food in the Last Year: Never true    Ran Out of Food in the Last Year: Never true  Transportation Needs: No Transportation Needs (06/19/2022)   PRAPARE - Administrator, Civil Service (Medical): No    Lack of Transportation (Non-Medical): No  Physical Activity: Inactive (06/19/2022)   Exercise Vital Sign    Days of Exercise per Week: 0 days    Minutes of Exercise per Session: 0 min  Stress: No Stress Concern Present (06/19/2022)   [employer] of Occupational Health - Occupational Stress Questionnaire    Feeling of Stress : Only a little  Social Connections: Socially Integrated (06/19/2022)   Social Connection and Isolation Panel [NHANES]    Frequency of Communication with Friends and Family: More than three times a week    Frequency of Social Gatherings with Friends and Family: Three times a week    Attends Religious Services: More than 4 times per year    Active Member of Clubs or Organizations: Yes    Attends Banker Meetings: More than 4 times per year    Marital Status: Married  Catering manager Violence: Not At Risk (06/19/2022)   Humiliation, Afraid, Rape, and Kick questionnaire    Fear of Current or Ex-Partner: No    Emotionally Abused: No    Physically Abused: No    Sexually Abused: No      Review of Systems  All other systems reviewed and are negative.      Objective:   Physical Exam Vitals reviewed.  Constitutional:      Appearance: Normal appearance.  Cardiovascular:     Rate and Rhythm: Normal rate and regular rhythm.     Pulses: Normal pulses.     Heart sounds: Murmur heard.  Pulmonary:     Effort: Pulmonary effort is normal. No respiratory distress.     Breath sounds: Normal breath sounds. No stridor. No wheezing, rhonchi or rales.  Chest:     Chest wall: No tenderness.  Musculoskeletal:     Right lower leg: Edema present.     Left lower leg: Edema present.        Legs:  Skin:    Findings: Erythema and rash present.  Neurological:     Mental Status: She is alert.         Assessment & Plan:  Atopic dermatitis, unspecified type I believe the patient has eczema.  Begin Elocon cream twice daily for 1 week and see if this improves the situation.

## 2023-06-11 NOTE — Telephone Encounter (Signed)
  Chief Complaint: Rash Symptoms: red raised skin Frequency: constant since Sunday Pertinent Negatives: Patient denies fever, drainage, oral swelling, difficulty breathing Disposition: [] ED /[] Urgent Care (no appt availability in office) / [x] Appointment(In office/virtual)/ []  [facility]/ [] Home Care/ [] Refused Recommended Disposition /[] [facility]/ []  Follow-up with PCP Additional Notes:  Requesting appointment. Saturday her legs felt like they were burning and stinging. Sunday red bumpy rash appeared and began itching intensely. Rash is on bilateral upper legs and spreading down her legs. Yesterday took po Benedryl with effect, also using Benadryl cream, this is effective and decreasing the itching sensation reducing itching from severe to mild. She has not had any new medications, foods, or hygiene products recently. Acute evaluation advised. Scheduled with pcp today. Educated on care advice as documented in protocol, patient verbalized understanding. Discussed reasons to call back.    Copied from CRM [id number]. Topic: Clinical - Red Word Triage >> Jun 11, 2023 10:08 AM Ivette P wrote: [facility] that prompted transfer to Nurse Triage: Pt called in with a red rash, says its itchy. Put benadryl. Travelling down both legs. Reason for Disposition  Mild widespread rash  (Exception: Heat rash lasting 3 days or less.)  Protocols used: Rash or Redness - [facility]

## 2023-06-23 DIAGNOSIS — M5416 Radiculopathy, lumbar region: Secondary | ICD-10-CM | POA: Diagnosis not present

## 2023-06-24 ENCOUNTER — Other Ambulatory Visit: Payer: Self-pay | Admitting: Family Medicine

## 2023-06-24 DIAGNOSIS — I10 Essential (primary) hypertension: Secondary | ICD-10-CM

## 2023-06-25 ENCOUNTER — Ambulatory Visit: Payer: Medicare HMO | Admitting: *Deleted

## 2023-06-25 DIAGNOSIS — Z0000 Encounter for general adult medical examination without abnormal findings: Secondary | ICD-10-CM

## 2023-06-25 NOTE — Patient Instructions (Signed)
 [name] , Thank you for taking time to come for your Medicare Wellness Visit. I appreciate your ongoing commitment to your health goals. Please review the following plan we discussed and let me know if I can assist you in the future.   Screening recommendations/referrals: Colonoscopy: no longer required Mammogram: up to date Bone Density: up to date Recommended yearly ophthalmology/optometry visit for glaucoma screening and checkup Recommended yearly dental visit for hygiene and checkup  Vaccinations: Influenza vaccine: up to date Pneumococcal vaccine: up to date       Preventive Care 65 Years and Older, Female Preventive care refers to lifestyle choices and visits with your health care provider that can promote health and wellness. What does preventive care include? A yearly physical exam. This is also called an annual well check. Dental exams once or twice a year. Routine eye exams. Ask your health care provider how often you should have your eyes checked. Personal lifestyle choices, including: Daily care of your teeth and gums. Regular physical activity. Eating a healthy diet. Avoiding tobacco and drug use. Limiting alcohol use. Practicing safe sex. Taking low-dose aspirin every day. Taking vitamin and mineral supplements as recommended by your health care provider. What happens during an annual well check? The services and screenings done by your health care provider during your annual well check will depend on your age, overall health, lifestyle risk factors, and family history of disease. Counseling  Your health care provider may ask you questions about your: Alcohol use. Tobacco use. Drug use. Emotional well-being. Home and relationship well-being. Sexual activity. Eating habits. History of falls. Memory and ability to understand (cognition). Work and work Astronomer. Reproductive health. Screening  You may have the following tests or measurements: Height,  weight, and BMI. Blood pressure. Lipid and cholesterol levels. These may be checked every 5 years, or more frequently if you are over 42 years old. Skin check. Lung cancer screening. You may have this screening every year starting at age 15 if you have a 30-pack-year history of smoking and currently smoke or have quit within the past 15 years. Fecal occult blood test (FOBT) of the stool. You may have this test every year starting at age 18. Flexible sigmoidoscopy or colonoscopy. You may have a sigmoidoscopy every 5 years or a colonoscopy every 10 years starting at age 40. Hepatitis C blood test. Hepatitis B blood test. Sexually transmitted disease (STD) testing. Diabetes screening. This is done by checking your blood sugar (glucose) after you have not eaten for a while (fasting). You may have this done every 1-3 years. Bone density scan. This is done to screen for osteoporosis. You may have this done starting at age 39. Mammogram. This may be done every 1-2 years. Talk to your health care provider about how often you should have regular mammograms. Talk with your health care provider about your test results, treatment options, and if necessary, the need for more tests. Vaccines  Your health care provider may recommend certain vaccines, such as: Influenza vaccine. This is recommended every year. Tetanus, diphtheria, and acellular pertussis (Tdap, Td) vaccine. You may need a Td booster every 10 years. Zoster vaccine. You may need this after age 43. Pneumococcal 13-valent conjugate (PCV13) vaccine. One dose is recommended after age 17. Pneumococcal polysaccharide (PPSV23) vaccine. One dose is recommended after age 49. Talk to your health care provider about which screenings and vaccines you need and how often you need them. This information is not intended to replace advice given to  you by your health care provider. Make sure you discuss any questions you have with your health care  provider. Document Released: 02/09/2015 Document Revised: 10/03/2015 Document Reviewed: 11/14/2014 Elsevier Interactive Patient Education  2017 [employer].  Fall Prevention in the Home Falls can cause injuries. They can happen to people of all ages. There are many things you can do to make your home safe and to help prevent falls. What can I do on the outside of my home? Regularly fix the edges of walkways and driveways and fix any cracks. Remove anything that might make you trip as you walk through a door, such as a raised step or threshold. Trim any bushes or trees on the path to your home. Use bright outdoor lighting. Clear any walking paths of anything that might make someone trip, such as rocks or tools. Regularly check to see if handrails are loose or broken. Make sure that both sides of any steps have handrails. Any raised decks and porches should have guardrails on the edges. Have any leaves, snow, or ice cleared regularly. Use sand or salt on walking paths during winter. Clean up any spills in your garage right away. This includes oil or grease spills. What can I do in the bathroom? Use night lights. Install grab bars by the toilet and in the tub and shower. Do not use towel bars as grab bars. Use non-skid mats or decals in the tub or shower. If you need to sit down in the shower, use a plastic, non-slip stool. Keep the floor dry. Clean up any water that spills on the floor as soon as it happens. Remove soap buildup in the tub or shower regularly. Attach bath mats securely with double-sided non-slip rug tape. Do not have throw rugs and other things on the floor that can make you trip. What can I do in the bedroom? Use night lights. Make sure that you have a light by your bed that is easy to reach. Do not use any sheets or blankets that are too big for your bed. They should not hang down onto the floor. Have a firm chair that has side arms. You can use this for support while  you get dressed. Do not have throw rugs and other things on the floor that can make you trip. What can I do in the kitchen? Clean up any spills right away. Avoid walking on wet floors. Keep items that you use a lot in easy-to-reach places. If you need to reach something above you, use a strong step stool that has a grab bar. Keep electrical cords out of the way. Do not use floor polish or wax that makes floors slippery. If you must use wax, use non-skid floor wax. Do not have throw rugs and other things on the floor that can make you trip. What can I do with my stairs? Do not leave any items on the stairs. Make sure that there are handrails on both sides of the stairs and use them. Fix handrails that are broken or loose. Make sure that handrails are as long as the stairways. Check any carpeting to make sure that it is firmly attached to the stairs. Fix any carpet that is loose or worn. Avoid having throw rugs at the top or bottom of the stairs. If you do have throw rugs, attach them to the floor with carpet tape. Make sure that you have a light switch at the top of the stairs and the bottom of the stairs.  If you do not have them, ask someone to add them for you. What else can I do to help prevent falls? Wear shoes that: Do not have high heels. Have rubber bottoms. Are comfortable and fit you well. Are closed at the toe. Do not wear sandals. If you use a stepladder: Make sure that it is fully opened. Do not climb a closed stepladder. Make sure that both sides of the stepladder are locked into place. Ask someone to hold it for you, if possible. Clearly mark and make sure that you can see: Any grab bars or handrails. First and last steps. Where the edge of each step is. Use tools that help you move around (mobility aids) if they are needed. These include: Canes. Walkers. Scooters. Crutches. Turn on the lights when you go into a dark area. Replace any light bulbs as soon as they burn  out. Set up your furniture so you have a clear path. Avoid moving your furniture around. If any of your floors are uneven, fix them. If there are any pets around you, be aware of where they are. Review your medicines with your doctor. Some medicines can make you feel dizzy. This can increase your chance of falling. Ask your doctor what other things that you can do to help prevent falls. This information is not intended to replace advice given to you by your health care provider. Make sure you discuss any questions you have with your health care provider. Document Released: 11/09/2008 Document Revised: 06/21/2015 Document Reviewed: 02/17/2014 Elsevier Interactive Patient Education  2017 [employer].

## 2023-06-25 NOTE — Progress Notes (Signed)
 Subjective:   [name] is a 78 y.o. female who presents for Medicare Annual (Subsequent) preventive examination.  Visit Complete: Virtual I connected with  CLEONE HULICK on 06/25/23 by a audio enabled telemedicine application and verified that I am speaking with the correct person using two identifiers.  Patient Location: Home  Provider Location: Home Office  I discussed the limitations of evaluation and management by telemedicine. The patient expressed understanding and agreed to proceed.  Vital Signs: Because this visit was a virtual/telehealth visit, some criteria may be missing or patient reported. Any vitals not documented were not able to be obtained and vitals that have been documented are patient reported. Cardiac Risk Factors include: advanced age (>67men, >2 women);hypertension     Objective:     There were no vitals filed for this visit. There is no height or weight on file to calculate BMI.     06/25/2023    8:28 AM 07/02/2022    8:58 AM 06/19/2022    9:16 AM 06/13/2021    8:23 AM 09/04/2020   10:39 AM 05/31/2020    8:32 AM 12/05/2013   11:05 AM  Advanced Directives  Does Patient Have a Medical Advance Directive? No No No No No No No  Would patient like information on creating a medical advance directive? No - Patient declined No - Patient declined Yes (MAU/Ambulatory/Procedural Areas - Information given) No - Patient declined No - Patient declined      Current Medications (verified) Outpatient Encounter Medications as of 06/25/2023  Medication Sig   acetaminophen  (TYLENOL ) 650 MG CR tablet Take by mouth as needed for pain.   albuterol  (VENTOLIN  HFA) 108 (90 Base) MCG/ACT inhaler Inhale 2 puffs into the lungs every 4 (four) hours as needed for wheezing or shortness of breath.   diltiazem  (CARDIZEM  CD) 360 MG 24 hr capsule Take 1 capsule by mouth once daily   doxazosin  (CARDURA ) 2 MG tablet TAKE 1 TABLET BY MOUTH ONCE DAILY . APPOINTMENT REQUIRED FOR FUTURE REFILLS    empagliflozin  (JARDIANCE ) 10 MG TABS tablet Take 1 tablet (10 mg total) by mouth daily before breakfast.   fluticasone  (FLONASE ) 50 MCG/ACT nasal spray Place 2 sprays into both nostrils daily.   furosemide  (LASIX ) 40 MG tablet Take 1 tablet by mouth daily. May take additional half tablet (20mg ) as needed for swelling   lisinopril  (ZESTRIL ) 40 MG tablet Take 1 tablet (40 mg total) by mouth daily.   loratadine (CLARITIN) 10 MG tablet Take 1 tablet by mouth daily.   Multiple Vitamins-Minerals (CENTRUM MINIS WOMEN 50+) TABS Take by mouth daily at 6 (six) AM.   omeprazole  (PRILOSEC) 40 MG capsule Take 1 capsule (40 mg total) by mouth daily.   Polyethylene Glycol 3350  (MIRALAX PO) Take by mouth as needed.   Probiotic Product (PROBIOTIC DAILY PO) Take 1 capsule by mouth daily at 6 (six) AM. EARTH'S PEARL SUPPLEMENT   rosuvastatin  (CRESTOR ) 10 MG tablet Take 1 tablet (10 mg total) by mouth daily.   traMADol  (ULTRAM ) 50 MG tablet Take 1 tablet (50 mg total) by mouth every 8 (eight) hours as needed.   mometasone  (ELOCON ) 0.1 % cream Apply topically 2 (two) times daily. (Patient not taking: Reported on 06/25/2023)   potassium chloride  SA (KLOR-CON  M) 20 MEQ tablet Take 1 tablet (20 mEq total) by mouth daily. (Patient not taking: Reported on 06/25/2023)   No facility-administered encounter medications on file as of 06/25/2023.    Allergies (verified) Aldactone  [spironolactone ]   History:  Past Medical History:  Diagnosis Date   Allergy    Arthritis    arthritis -knees   Colon polyps    benign- last colonoscopy 09-2008   Depression    GERD (gastroesophageal reflux disease)    hx GERD   H/O seasonal allergies    no problems now   Hyperlipidemia    Hypertension    Murmur, heart    Spinal stenosis of lumbar region at multiple levels    Past Surgical History:  Procedure Laterality Date   ABDOMINAL HYSTERECTOMY     APPENDECTOMY     with gallbladder surgery   BUNIONECTOMY Bilateral     CHOLECYSTECTOMY     open-gallstones   COLONOSCOPY     EXCISION OF ADNEXAL MASS Left August 03, 2013   at [facility]/right adnexal nodule,excision;benigh lymph node   OOPHORECTOMY Bilateral 2008   ovarian cyst   POLYPECTOMY     Family History  Problem Relation Age of Onset   Colon cancer Mother 107   Heart attack Father    Breast cancer Paternal Aunt    Colon cancer Brother 57   Colon polyps Brother    Esophageal cancer Neg Hx    Pancreatic cancer Neg Hx    Rectal cancer Neg Hx    Stomach cancer Neg Hx    Social History   Socioeconomic History   Marital status: Married    Spouse name: Not on file   Number of children: Not on file   Years of education: Not on file   Highest education level: Not on file  Occupational History   Not on file  Tobacco Use   Smoking status: Never   Smokeless tobacco: Never  Vaping Use   Vaping status: Never Used  Substance and Sexual Activity   Alcohol use: No   Drug use: No   Sexual activity: Yes  Other Topics Concern   Not on file  Social History Narrative   Not on file   Social Drivers of Health   Financial Resource Strain: Low Risk  (06/25/2023)   Overall Financial Resource Strain (CARDIA)    Difficulty of Paying Living Expenses: Not hard at all  Food Insecurity: No Food Insecurity (06/25/2023)   Hunger Vital Sign    Worried About Running Out of Food in the Last Year: Never true    Ran Out of Food in the Last Year: Never true  Transportation Needs: No Transportation Needs (06/25/2023)   PRAPARE - Administrator, Civil Service (Medical): No    Lack of Transportation (Non-Medical): No  Physical Activity: Inactive (06/25/2023)   Exercise Vital Sign    Days of Exercise per Week: 0 days    Minutes of Exercise per Session: 0 min  Stress: No Stress Concern Present (06/25/2023)   [employer] of Occupational Health - Occupational Stress Questionnaire    Feeling of Stress : Not at all  Social Connections: Moderately  Integrated (06/25/2023)   Social Connection and Isolation Panel [NHANES]    Frequency of Communication with Friends and Family: More than three times a week    Frequency of Social Gatherings with Friends and Family: Twice a week    Attends Religious Services: More than 4 times per year    Active Member of [employer] or Organizations: No    Attends Banker Meetings: Never    Marital Status: Married    Tobacco Counseling Counseling given: Not Answered   Clinical Intake:  Pre-visit preparation completed: Yes  Pain :  No/denies pain     Diabetes: No  How often do you need to have someone help you when you read instructions, pamphlets, or other written materials from your doctor or pharmacy?: 1 - Never  Interpreter Needed?: No  Information entered by :: Kieth Pelt LPN   Activities of Daily Living    06/25/2023    8:29 AM  In your present state of health, do you have any difficulty performing the following activities:  Hearing? 0  Vision? 0  Difficulty concentrating or making decisions? 0  Walking or climbing stairs? 1  Dressing or bathing? 0  Doing errands, shopping? 0  Preparing Food and eating ? N  In the past six months, have you accidently leaked urine? Y  Do you have problems with loss of bowel control? N  Managing your Medications? N  Managing your Finances? N  Housekeeping or managing your Housekeeping? Y    Patient Care Team: Austine Lefort, MD as PCP - General (Family Medicine) Jann Melody, MD as PCP - Cardiology (Cardiology) Myrle Aspen, [facility] (Inactive) as Pharmacist (Pharmacist) Ivery Marking, MD as Consulting Physician (Obstetrics and Gynecology) Annis Kinder, MD as Consulting Physician (Pain Medicine)  Indicate any recent Medical Services you may have received from other than [facility] providers in the past year (date may be approximate).     Assessment:    This is a routine wellness examination for  Shondell.  Hearing/Vision screen Hearing Screening - Comments:: No trouble hearing Vision Screening - Comments:: Up to date [facility]   Goals Addressed             This Visit's Progress    Exercise 3x per week (30 min per time)   Not on track    Have 3 meals a day   On track    Increase physical activity       Prevent falls   On track      Depression Screen    06/25/2023    8:33 AM 05/21/2023    4:00 PM 06/19/2022    9:16 AM 05/30/2022   10:35 AM 02/28/2022    2:32 PM 12/24/2021    9:09 AM 11/27/2021   11:40 AM  PHQ 2/9 Scores  PHQ - 2 Score 0 0 0 0 0 0 2  PHQ- 9 Score 2          Fall Risk    06/25/2023    8:24 AM 05/21/2023    4:00 PM 11/20/2022    3:02 PM 06/19/2022    9:15 AM 05/30/2022   10:31 AM  Fall Risk   Falls in the past year? 0 0 0 0 0  Number falls in past yr: 0 0 0 0 0  Injury with Fall? 0 0 1 0 0  Risk for fall due to :  No Fall Risks No Fall Risks No Fall Risks No Fall Risks  Follow up Falls evaluation completed;Education provided;Falls prevention discussed Falls prevention discussed;Falls evaluation completed Falls evaluation completed Falls prevention discussed;Education provided;Falls evaluation completed Falls prevention discussed    MEDICARE RISK AT HOME: Medicare Risk at Home Any stairs in or around the home?: No If so, are there any without handrails?: No Home free of loose throw rugs in walkways, pet beds, electrical cords, etc?: Yes Adequate lighting in your home to reduce risk of falls?: Yes Life alert?: No Use of a cane, walker or w/c?: Yes Grab bars in the bathroom?: Yes Shower chair or bench in shower?: Yes  Elevated toilet seat or a handicapped toilet?: Yes  TIMED UP AND GO:  Was the test performed?  No    Cognitive Function:        06/25/2023    8:29 AM 06/19/2022    9:16 AM 06/13/2021    8:28 AM 05/31/2020    8:46 AM  6CIT Screen  What Year? 0 points 0 points 0 points 0 points  What month? 0 points 0 points 0 points 0 points  What  time? 0 points 0 points 0 points 0 points  Count back from 20 0 points 0 points 0 points 0 points  Months in reverse 0 points 0 points 0 points 0 points  Repeat phrase 2 points 0 points 0 points 0 points  Total Score 2 points 0 points 0 points 0 points    Immunizations Immunization History  Administered Date(s) Administered   Fluad Quad(high Dose 65+) 10/06/2018, 11/07/2019, 11/14/2020, 11/04/2021   Fluad Trivalent(High Dose 65+) 10/29/2022   Influenza, High Dose Seasonal PF 12/03/2017   Influenza,inj,Quad PF,6+ Mos 11/06/2015, 11/03/2016   PFIZER(Purple Top)SARS-COV-2 Vaccination 02/15/2019, 03/07/2019, 12/05/2019   Pneumococcal Conjugate-13 10/20/2014   Pneumococcal Polysaccharide-23 06/17/2012    TDAP status: Up to date  Flu Vaccine status: Up to date  Pneumococcal vaccine status: Up to date  Covid-19 vaccine status: Information provided on how to obtain vaccines.   Qualifies for Shingles Vaccine? Yes   Zostavax completed No   Shingrix Completed?: No.    Education has been provided regarding the importance of this vaccine. Patient has been advised to call insurance company to determine out of pocket expense if they have not yet received this vaccine. Advised may also receive vaccine at local pharmacy or Health Dept. Verbalized acceptance and understanding.  Screening Tests Health Maintenance  Topic Date Due   Zoster Vaccines- Shingrix (1 of 2) Never done   COVID-19 Vaccine (4 - 2024-25 season) 07/11/2023 (Originally 09/28/2022)   INFLUENZA VACCINE  08/28/2023   MAMMOGRAM  05/13/2024   Medicare Annual Wellness (AWV)  06/24/2024   Colonoscopy  08/06/2025   Pneumonia Vaccine 2+ Years old  Completed   DEXA SCAN  Completed   Hepatitis C Screening  Completed   HPV VACCINES  Aged Out   Meningococcal B Vaccine  Aged Out   DTaP/Tdap/Td  Discontinued    Health Maintenance  Health Maintenance Due  Topic Date Due   Zoster Vaccines- Shingrix (1 of 2) Never done     Colorectal cancer screening: No longer required.   Mammogram status: Completed  . Repeat every year  Bone Density status: Completed 2024. Results reflect: Bone density results: NORMAL. Repeat every 0 years.  Lung Cancer Screening: (Low Dose CT Chest recommended if Age 17-80 years, 20 pack-year currently smoking OR have quit w/in 15years.) does not qualify.   Lung Cancer Screening Referral:   Additional Screening:  Hepatitis C Screening: does not qualify; Completed 2020  Vision Screening: Recommended annual ophthalmology exams for early detection of glaucoma and other disorders of the eye. Is the patient up to date with their annual eye exam?  Yes  Who is the provider or what is the name of the office in which the patient attends annual eye exams? [facility] If pt is not established with a provider, would they like to be referred to a provider to establish care? No .   Dental Screening: Recommended annual dental exams for proper oral hygiene    Community Resource Referral / Chronic Care Management: CRR required this visit?  No   CCM required this visit?  No     Plan:     I have personally reviewed and noted the following in the patient's chart:   Medical and social history Use of alcohol, tobacco or illicit drugs  Current medications and supplements including opioid prescriptions. Patient is not currently taking opioid prescriptions. Functional ability and status Nutritional status Physical activity Advanced directives List of other physicians Hospitalizations, surgeries, and ER visits in previous 12 months Vitals Screenings to include cognitive, depression, and falls Referrals and appointments  In addition, I have reviewed and discussed with patient certain preventive protocols, quality metrics, and best practice recommendations. A written personalized care plan for preventive services as well as general preventive health recommendations were provided to patient.      Kieth Pelt, LPN   0/[id number]/1191   After Visit Summary: (MyChart) Due to this being a telephonic visit, the after visit summary with patients personalized plan was offered to patient via MyChart   Nurse Notes:

## 2023-06-30 ENCOUNTER — Ambulatory Visit: Admitting: Family Medicine

## 2023-06-30 ENCOUNTER — Encounter: Payer: Self-pay | Admitting: Family Medicine

## 2023-06-30 VITALS — BP 144/80 | HR 61 | Temp 99.1°F | Ht 64.0 in | Wt 244.4 lb

## 2023-06-30 DIAGNOSIS — Z23 Encounter for immunization: Secondary | ICD-10-CM

## 2023-06-30 DIAGNOSIS — I5189 Other ill-defined heart diseases: Secondary | ICD-10-CM | POA: Diagnosis not present

## 2023-06-30 DIAGNOSIS — I5032 Chronic diastolic (congestive) heart failure: Secondary | ICD-10-CM

## 2023-06-30 DIAGNOSIS — Z0001 Encounter for general adult medical examination with abnormal findings: Secondary | ICD-10-CM

## 2023-06-30 DIAGNOSIS — K130 Diseases of lips: Secondary | ICD-10-CM

## 2023-06-30 DIAGNOSIS — E7800 Pure hypercholesterolemia, unspecified: Secondary | ICD-10-CM | POA: Diagnosis not present

## 2023-06-30 DIAGNOSIS — I10 Essential (primary) hypertension: Secondary | ICD-10-CM | POA: Diagnosis not present

## 2023-06-30 DIAGNOSIS — Z0000 Encounter for general adult medical examination without abnormal findings: Secondary | ICD-10-CM

## 2023-06-30 LAB — LIPID PANEL
Cholesterol: 128 mg/dL (ref <200)
HDL: 57 mg/dL (ref >=50)
LDL Cholesterol (Calc): 57 mg/dL
Non-HDL Cholesterol (Calc): 71 mg/dL (ref <130)
Total CHOL/HDL Ratio: 2.2 (calc) (ref <5.0)
Triglycerides: 64 mg/dL (ref <150)

## 2023-06-30 LAB — COMPREHENSIVE METABOLIC PANEL WITH GFR
AG Ratio: 1.8 (calc) (ref 1.0–2.5)
ALT: 17 U/L (ref 6–29)
AST: 14 U/L (ref 10–35)
Albumin: 4.3 g/dL (ref 3.6–5.1)
Alkaline phosphatase (APISO): 41 U/L (ref 37–153)
BUN: 22 mg/dL (ref 7–25)
CO2: 33 mmol/L — ABNORMAL HIGH (ref 20–32)
Calcium: 9.2 mg/dL (ref 8.6–10.4)
Chloride: 98 mmol/L (ref 98–110)
Creat: 0.88 mg/dL (ref 0.60–1.00)
Globulin: 2.4 g/dL (ref 1.9–3.7)
Glucose, Bld: 86 mg/dL (ref 65–99)
Potassium: 3.4 mmol/L — ABNORMAL LOW (ref 3.5–5.3)
Sodium: 141 mmol/L (ref 135–146)
Total Bilirubin: 0.5 mg/dL (ref 0.2–1.2)
Total Protein: 6.7 g/dL (ref 6.1–8.1)
eGFR: 68 mL/min/{1.73_m2} (ref >=60)

## 2023-06-30 NOTE — Progress Notes (Signed)
 Subjective:    Patient ID: [name], female    DOB: Sep 27, 1945, 78 y.o.   MRN: [id number]  HPI Patient is a very pleasant 78 year old African-American female who presents today for complete physical exam.  Past medical history is significant for CHF with preserved EF and left ventricular septal hypertrophy seen on an echocardiogram.  Patient's last colonoscopy was in 2024.  This showed a small tubular adenoma.  GI recommended another colonoscopy in 3 years which would be 2027.  Patient had her mammogram in April which was normal.  Due to her age she does not require Pap smear.  Reviewing her immunizations, her immunization is up-to-date except she is due for second [place] vaccine.  We also discussed RSV vaccination as well as an annual flu shot and COVID shot in the fall.  Patient had a CBC that was normal in April. Past Medical History:  Diagnosis Date   Allergy    Arthritis    arthritis -knees   Colon polyps    benign- last colonoscopy 09-2008   Depression    GERD (gastroesophageal reflux disease)    hx GERD   H/O seasonal allergies    no problems now   Hyperlipidemia    Hypertension    Murmur, heart    Spinal stenosis of lumbar region at multiple levels    Past Surgical History:  Procedure Laterality Date   ABDOMINAL HYSTERECTOMY     APPENDECTOMY     with gallbladder surgery   BUNIONECTOMY Bilateral    CHOLECYSTECTOMY     open-gallstones   COLONOSCOPY     EXCISION OF ADNEXAL MASS Left August 03, 2013   at [facility]/right adnexal nodule,excision;benigh lymph node   OOPHORECTOMY Bilateral 2008   ovarian cyst   POLYPECTOMY     Current Outpatient Medications on File Prior to Visit  Medication Sig Dispense Refill   acetaminophen  (TYLENOL ) 650 MG CR tablet Take by mouth as needed for pain.     albuterol  (VENTOLIN  HFA) 108 (90 Base) MCG/ACT inhaler Inhale 2 puffs into the lungs every 4 (four) hours as needed for wheezing or shortness of breath. 9 g 0   diltiazem  (CARDIZEM   CD) 360 MG 24 hr capsule Take 1 capsule by mouth once daily 90 capsule 0   doxazosin  (CARDURA ) 2 MG tablet TAKE 1 TABLET BY MOUTH ONCE DAILY . APPOINTMENT REQUIRED FOR FUTURE REFILLS 30 tablet 0   empagliflozin  (JARDIANCE ) 10 MG TABS tablet Take 1 tablet (10 mg total) by mouth daily before breakfast. 90 tablet 2   fluticasone  (FLONASE ) 50 MCG/ACT nasal spray Place 2 sprays into both nostrils daily. 16 g 2   furosemide  (LASIX ) 40 MG tablet Take 1 tablet by mouth daily. May take additional half tablet (20mg ) as needed for swelling 135 tablet 3   lisinopril  (ZESTRIL ) 40 MG tablet Take 1 tablet (40 mg total) by mouth daily. 90 tablet 3   loratadine (CLARITIN) 10 MG tablet Take 1 tablet by mouth daily.     mometasone  (ELOCON ) 0.1 % cream Apply topically 2 (two) times daily. 30 g 1   Multiple Vitamins-Minerals (CENTRUM MINIS WOMEN 50+) TABS Take by mouth daily at 6 (six) AM.     omeprazole  (PRILOSEC) 40 MG capsule Take 1 capsule (40 mg total) by mouth daily. 90 capsule 3   Polyethylene Glycol 3350  (MIRALAX PO) Take by mouth as needed.     Probiotic Product (PROBIOTIC DAILY PO) Take 1 capsule by mouth daily at 6 (six) AM. EARTH'S PEARL  SUPPLEMENT     rosuvastatin  (CRESTOR ) 10 MG tablet Take 1 tablet (10 mg total) by mouth daily. 90 tablet 3   traMADol  (ULTRAM ) 50 MG tablet Take 1 tablet (50 mg total) by mouth every 8 (eight) hours as needed. 30 tablet 0   potassium chloride  SA (KLOR-CON  M) 20 MEQ tablet Take 1 tablet (20 mEq total) by mouth daily. (Patient not taking: Reported on 06/30/2023) 30 tablet 3   No current facility-administered medications on file prior to visit.   Allergies  Allergen Reactions   Aldactone  [Spironolactone ] Rash   Social History   Socioeconomic History   Marital status: Married    Spouse name: Not on file   Number of children: Not on file   Years of education: Not on file   Highest education level: Not on file  Occupational History   Not on file  Tobacco Use    Smoking status: Never   Smokeless tobacco: Never  Vaping Use   Vaping status: Never Used  Substance and Sexual Activity   Alcohol use: No   Drug use: No   Sexual activity: Yes  Other Topics Concern   Not on file  Social History Narrative   Not on file   Social Drivers of Health   Financial Resource Strain: Low Risk  (06/25/2023)   Overall Financial Resource Strain (CARDIA)    Difficulty of Paying Living Expenses: Not hard at all  Food Insecurity: No Food Insecurity (06/25/2023)   Hunger Vital Sign    Worried About Running Out of Food in the Last Year: Never true    Ran Out of Food in the Last Year: Never true  Transportation Needs: No Transportation Needs (06/25/2023)   PRAPARE - Administrator, Civil Service (Medical): No    Lack of Transportation (Non-Medical): No  Physical Activity: Inactive (06/25/2023)   Exercise Vital Sign    Days of Exercise per Week: 0 days    Minutes of Exercise per Session: 0 min  Stress: No Stress Concern Present (06/25/2023)   [employer] of Occupational Health - Occupational Stress Questionnaire    Feeling of Stress : Not at all  Social Connections: Moderately Integrated (06/25/2023)   Social Connection and Isolation Panel [NHANES]    Frequency of Communication with Friends and Family: More than three times a week    Frequency of Social Gatherings with Friends and Family: Twice a week    Attends Religious Services: More than 4 times per year    Active Member of [employer] or Organizations: No    Attends Banker Meetings: Never    Marital Status: Married  Catering manager Violence: Not At Risk (06/25/2023)   Humiliation, Afraid, Rape, and Kick questionnaire    Fear of Current or Ex-Partner: No    Emotionally Abused: No    Physically Abused: No    Sexually Abused: No   Family History  Problem Relation Age of Onset   Colon cancer Mother 9   Heart attack Father    Breast cancer Paternal Aunt    Colon cancer Brother 72    Colon polyps Brother    Esophageal cancer Neg Hx    Pancreatic cancer Neg Hx    Rectal cancer Neg Hx    Stomach cancer Neg Hx       Review of Systems  All other systems reviewed and are negative.      Objective:   Physical Exam Vitals reviewed.  Constitutional:      General:  She is not in acute distress.    Appearance: Normal appearance. She is normal weight. She is not ill-appearing, toxic-appearing or diaphoretic.  HENT:     Head: Normocephalic and atraumatic.     Right Ear: Tympanic membrane, ear canal and external ear normal. There is no impacted cerumen.     Left Ear: Tympanic membrane, ear canal and external ear normal. There is no impacted cerumen.     Nose: Nose normal. No congestion or rhinorrhea.     Mouth/Throat:     Mouth: Mucous membranes are moist.     Pharynx: No oropharyngeal exudate or posterior oropharyngeal erythema.  Eyes:     General: No scleral icterus.       Right eye: No discharge.        Left eye: No discharge.     Extraocular Movements: Extraocular movements intact.     Conjunctiva/sclera: Conjunctivae normal.     Pupils: Pupils are equal, round, and reactive to light.  Neck:     Vascular: No carotid bruit.  Cardiovascular:     Rate and Rhythm: Normal rate and regular rhythm.     Pulses: Normal pulses.     Heart sounds: Murmur heard.     No friction rub. No gallop.  Pulmonary:     Effort: Pulmonary effort is normal. No respiratory distress.     Breath sounds: Normal breath sounds. No stridor. No wheezing, rhonchi or rales.  Chest:     Chest wall: No tenderness.  Abdominal:     General: Bowel sounds are normal. There is no distension.     Palpations: Abdomen is soft. There is no mass.     Tenderness: There is no abdominal tenderness. There is no right CVA tenderness, left CVA tenderness, guarding or rebound.     Hernia: No hernia is present.  Musculoskeletal:        General: No swelling, deformity or signs of injury.     Cervical back:  Normal range of motion and neck supple. No muscular tenderness.     Right lower leg: No edema.     Left lower leg: No edema.  Lymphadenopathy:     Cervical: No cervical adenopathy.  Skin:    General: Skin is warm.     Coloration: Skin is not jaundiced or pale.     Findings: No bruising, erythema, lesion or rash.  Neurological:     General: No focal deficit present.     Mental Status: She is alert and oriented to person, place, and time. Mental status is at baseline.     Cranial Nerves: No cranial nerve deficit.     Sensory: No sensory deficit.     Motor: No weakness.     Coordination: Coordination normal.     Gait: Gait normal.     Deep Tendon Reflexes: Reflexes normal.  Psychiatric:        Mood and Affect: Mood normal.        Behavior: Behavior normal.        Thought Content: Thought content normal.        Judgment: Judgment normal.           Assessment & Plan:  Lesion of lip - Plan: Ambulatory referral to Dermatology  Essential hypertension - Plan: Comprehensive metabolic panel with GFR, Lipid panel  Encounter for Medicare annual wellness exam  Diastolic dysfunction  Chronic heart failure with preserved ejection fraction (HCC)  Pure hypercholesterolemia Patient has a small 5 mm cystlike lesion on her  left lower lip.  I do not believe that this is malignant.  However the patient would like to have this removed.  I recommended a dermatology consult for excision of the cystlike mass.  Mammogram is up-to-date.  Colonoscopy is up-to-date.  Pap smear is not due.  Patient received her shingles vaccine today.  Pneumonia vaccines are up-to-date.  I did recommend an RSV vaccination.  I will check a CMP and a fasting lipid panel.  Ideally I would like to keep her LDL cholesterol less than 409.

## 2023-06-30 NOTE — Addendum Note (Signed)
 Addended by: Gillermo Lack K on: 06/30/2023 11:27 AM   Modules accepted: Orders

## 2023-07-02 ENCOUNTER — Ambulatory Visit: Payer: Self-pay | Admitting: Family Medicine

## 2023-07-03 ENCOUNTER — Other Ambulatory Visit: Payer: Self-pay

## 2023-07-03 DIAGNOSIS — I10 Essential (primary) hypertension: Secondary | ICD-10-CM

## 2023-07-03 DIAGNOSIS — E876 Hypokalemia: Secondary | ICD-10-CM

## 2023-07-03 DIAGNOSIS — I5032 Chronic diastolic (congestive) heart failure: Secondary | ICD-10-CM

## 2023-07-03 MED ORDER — POTASSIUM CHLORIDE CRYS ER 10 MEQ PO TBCR: 10.0000 meq | EXTENDED_RELEASE_TABLET | Freq: Two times a day (BID) | ORAL | 1 refills | Status: DC

## 2023-07-28 ENCOUNTER — Other Ambulatory Visit: Payer: Self-pay | Admitting: Family Medicine

## 2023-07-28 DIAGNOSIS — I10 Essential (primary) hypertension: Secondary | ICD-10-CM

## 2023-08-14 ENCOUNTER — Telehealth: Payer: Self-pay

## 2023-08-14 NOTE — Telephone Encounter (Signed)
 Copied from CRM [id number]. Topic: Clinical - Home Health Verbal Orders >> Aug 14, 2023 11:00 AM Delon DASEN wrote: Caller/Agency: Erminio with [employer] Callback Number: [phone] Service Requested: Home Health Frequency: n/a Any new concerns about the patient? Yes- had put a heart monitor on the patient and received abnormal results - faxing report

## 2023-08-17 NOTE — Telephone Encounter (Signed)
 First attempt to follow up. No contact made. Lvm for call back.

## 2023-08-19 ENCOUNTER — Ambulatory Visit: Payer: Self-pay | Admitting: Family Medicine

## 2023-08-20 ENCOUNTER — Ambulatory Visit (INDEPENDENT_AMBULATORY_CARE_PROVIDER_SITE_OTHER)

## 2023-08-20 ENCOUNTER — Encounter: Payer: Self-pay | Admitting: Podiatry

## 2023-08-20 ENCOUNTER — Ambulatory Visit: Admitting: Podiatry

## 2023-08-20 VITALS — Ht 64.0 in | Wt 244.4 lb

## 2023-08-20 DIAGNOSIS — M7752 Other enthesopathy of left foot: Secondary | ICD-10-CM

## 2023-08-20 DIAGNOSIS — M7751 Other enthesopathy of right foot: Secondary | ICD-10-CM

## 2023-08-20 DIAGNOSIS — R600 Localized edema: Secondary | ICD-10-CM | POA: Diagnosis not present

## 2023-08-20 MED ORDER — TRIAMCINOLONE ACETONIDE 10 MG/ML IJ SUSP
10.0000 mg | Freq: Once | INTRAMUSCULAR | Status: AC
  Administered 2023-08-20: 10 mg via INTRA_ARTICULAR

## 2023-08-20 NOTE — Progress Notes (Signed)
 Subjective:   Patient ID: [name], female   DOB: 78 y.o.   MRN: [id number]   HPI Patient presents with a lot of pain underneath her left foot and states that her right foot has been swelling and it does this off and on and it has been bothersome and she needs compression   ROS      Objective:  Physical Exam  Neurovascular status intact negative Toula' sign was noted edema of the right foot and ankle +1 pitting localized with inflammation fluid around the fifth MPJ of the left foot painful when pressed.  Patient does have good digital perfusion well-oriented     Assessment:  Appears to be chronic localized edema no indication currently of DVT right along with inflammatory capsulitis fifth MPJ left     Plan:  Neurovascular status discussed and H&P done.  At this point sterile prep injected around the capsule 3 mg Dexasone Kenalog  5 mg Xylocaine  discussed edema present right lower leg and placed in compression stocking with all instructions on usage and did discuss edema and if symptoms get worse we will have to consider other treatment  X-rays taken bilateral were negative for sign of fracture did not indicate excessive arthritis or other pathology

## 2023-08-25 ENCOUNTER — Other Ambulatory Visit: Payer: Self-pay | Admitting: Family Medicine

## 2023-08-25 DIAGNOSIS — I5032 Chronic diastolic (congestive) heart failure: Secondary | ICD-10-CM

## 2023-08-25 DIAGNOSIS — I10 Essential (primary) hypertension: Secondary | ICD-10-CM

## 2023-08-27 NOTE — Telephone Encounter (Signed)
 Second attempt at contact . lvm

## 2023-08-28 ENCOUNTER — Other Ambulatory Visit: Payer: Self-pay | Admitting: Family Medicine

## 2023-08-28 DIAGNOSIS — I10 Essential (primary) hypertension: Secondary | ICD-10-CM

## 2023-08-28 NOTE — Telephone Encounter (Signed)
 Requested Prescriptions  Pending Prescriptions Disp Refills   doxazosin  (CARDURA ) 2 MG tablet [Pharmacy Med Name: Doxazosin  Mesylate 2 MG Oral Tablet] 90 tablet 1    Sig: TAKE 1 TABLET BY MOUTH ONCE DAILY. APPOINTMENT REQUIRED FOR FUTURE REFILLS.     Cardiovascular:  Alpha Blockers Failed - 08/28/2023  3:47 PM      Failed - Last BP in normal range    BP Readings from Last 1 Encounters:  06/30/23 (!) 144/80         Passed - Valid encounter within last 6 months    Recent Outpatient Visits           1 month ago Lesion of lip   [place] [facility] Duanne Butler DASEN, MD   2 months ago Atopic dermatitis, unspecified type   [place] [facility] Duanne Butler DASEN, MD   3 months ago Arthritis   [place] [facility] Duanne Butler DASEN, MD   10 months ago Right lower quadrant abdominal pain   [place] [facility] Duanne Butler DASEN, MD   1 year ago Encounter for Medicare annual wellness exam   [place] [facility] Duanne Butler DASEN, MD       Future Appointments             In 1 month Alm Delon SAILOR, DO [facility]

## 2023-08-31 NOTE — Telephone Encounter (Signed)
 Third call back . No contact made. Lvm w/ request for Erminio to fax abnormal results back to office as we have never received them for review .

## 2023-09-02 NOTE — Telephone Encounter (Signed)
 Fourth attempt to contact w/ none made. Lvm w/ note that the encounter will be closed due to several attempts made w/ no contact back.

## 2023-09-08 ENCOUNTER — Other Ambulatory Visit: Payer: Self-pay | Admitting: Family Medicine

## 2023-09-08 DIAGNOSIS — M1990 Unspecified osteoarthritis, unspecified site: Secondary | ICD-10-CM

## 2023-09-08 DIAGNOSIS — G8929 Other chronic pain: Secondary | ICD-10-CM

## 2023-09-15 ENCOUNTER — Telehealth: Payer: Self-pay

## 2023-09-15 NOTE — Telephone Encounter (Signed)
 Copied from CRM [id number]. Topic: General - Other >> Sep 15, 2023  2:06 PM Amy B wrote: Reason for CRM: Erminio with [facility] called returning a call from [facility] regarding patient.  Call back number is [phone]

## 2023-09-19 IMAGING — MG MM DIGITAL SCREENING BILAT W/ TOMO AND CAD
8 series · 8 of 24 positions shown · non-contrast
Comparison: Previous exam(s).

CLINICAL DATA: Screening.

EXAM:
DIGITAL SCREENING BILATERAL MAMMOGRAM WITH TOMOSYNTHESIS AND CAD
TECHNIQUE: Bilateral screening digital craniocaudal and mediolateral oblique
mammograms were obtained. Bilateral screening digital breast
tomosynthesis was performed. The images were evaluated with
computer-aided detection. Best images possible per technologist
communication.

[R CC synth-2D]
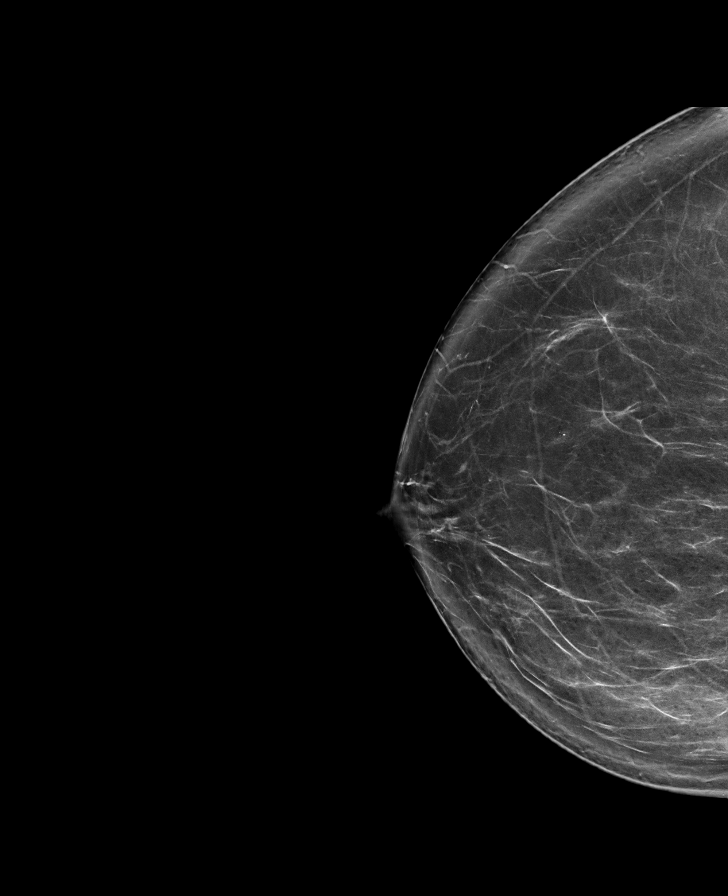

[L CC synth-2D]
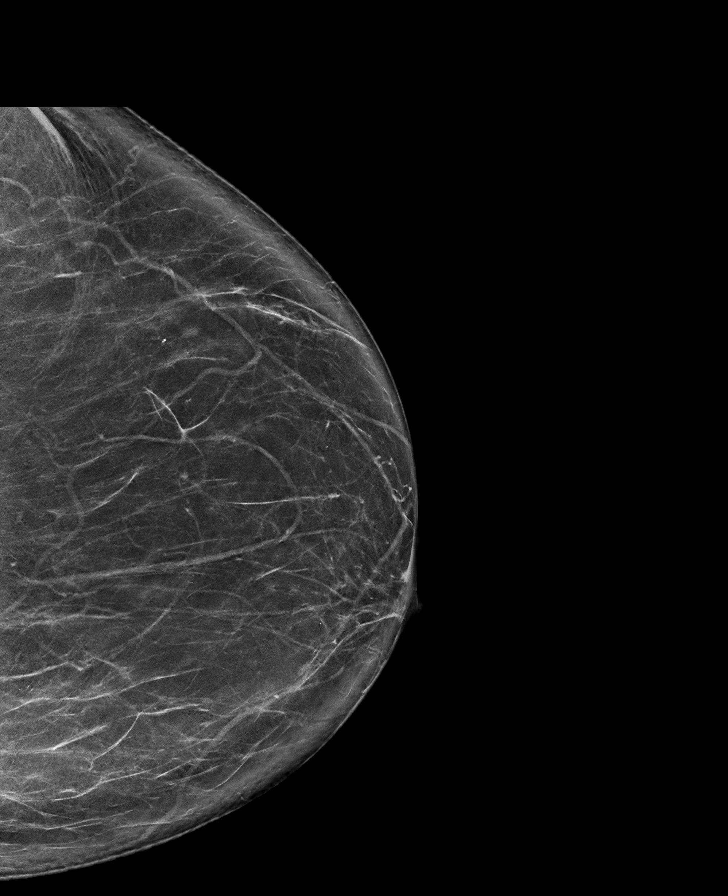

[R MLO synth-2D]
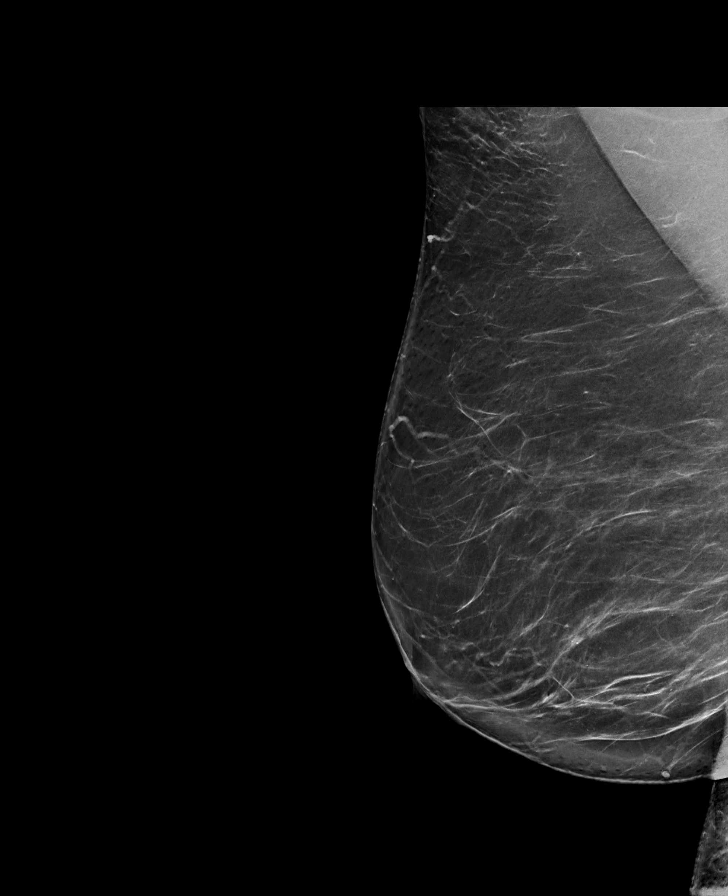

[L MLO synth-2D]
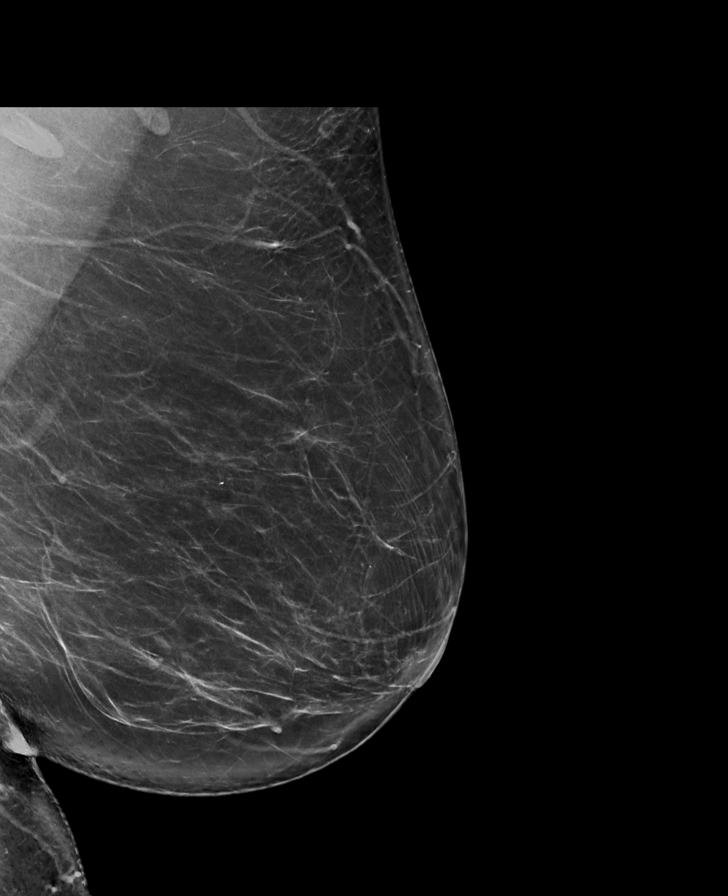

[L CC tomo · tomo slice 44/87.0]
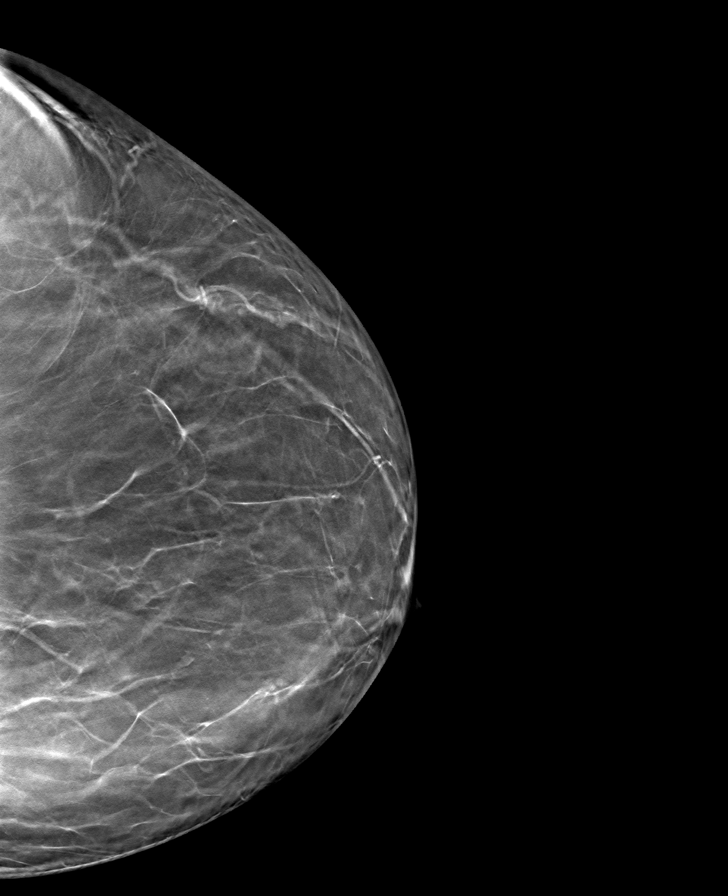

[R CC tomo · tomo slice 43/86.0]
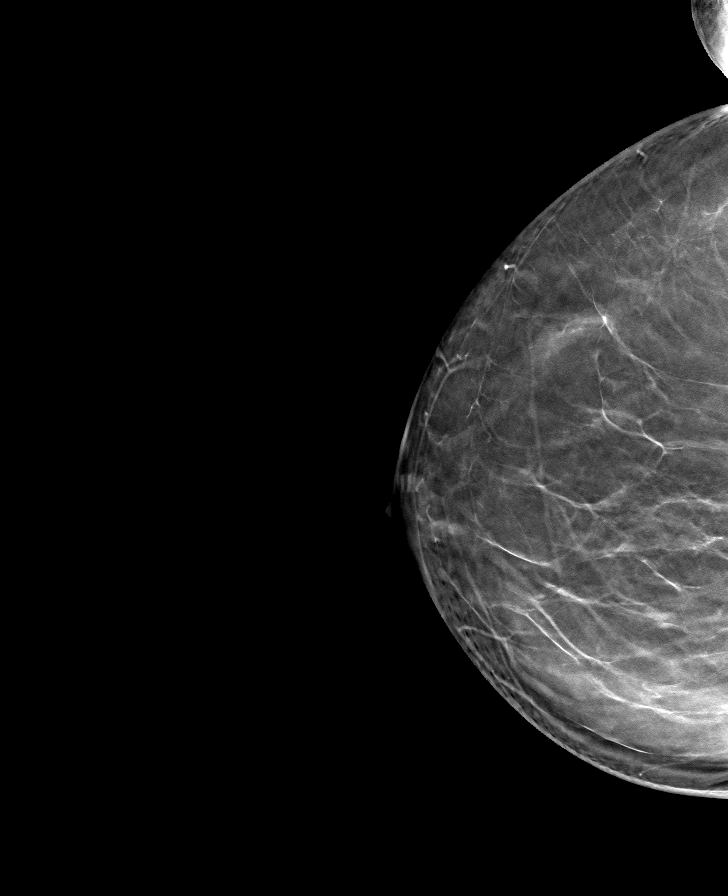

[L MLO tomo · tomo slice 49/97.0]
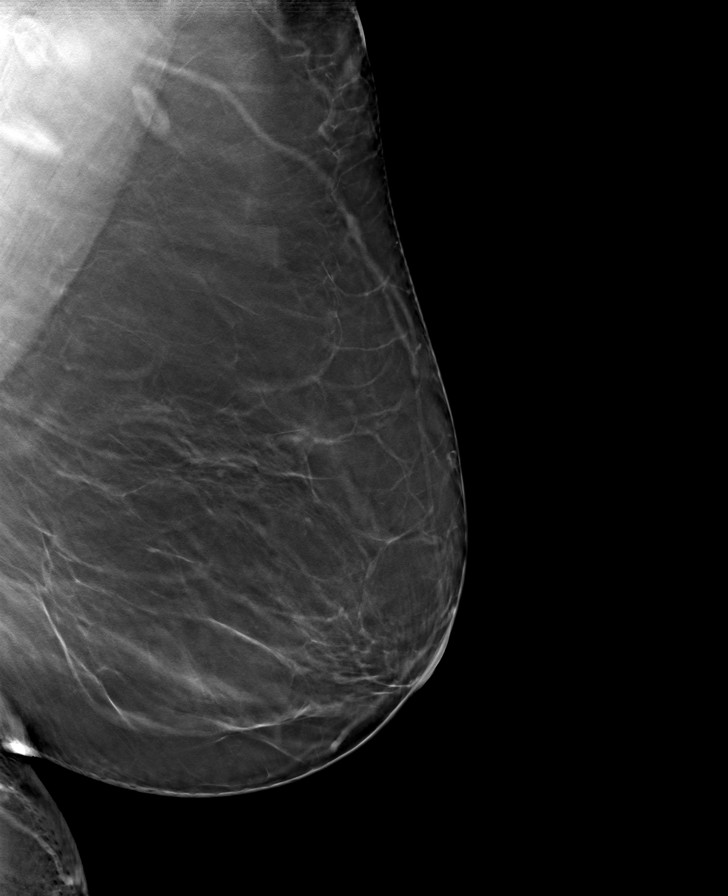

[R MLO tomo · tomo slice 47/94.0]
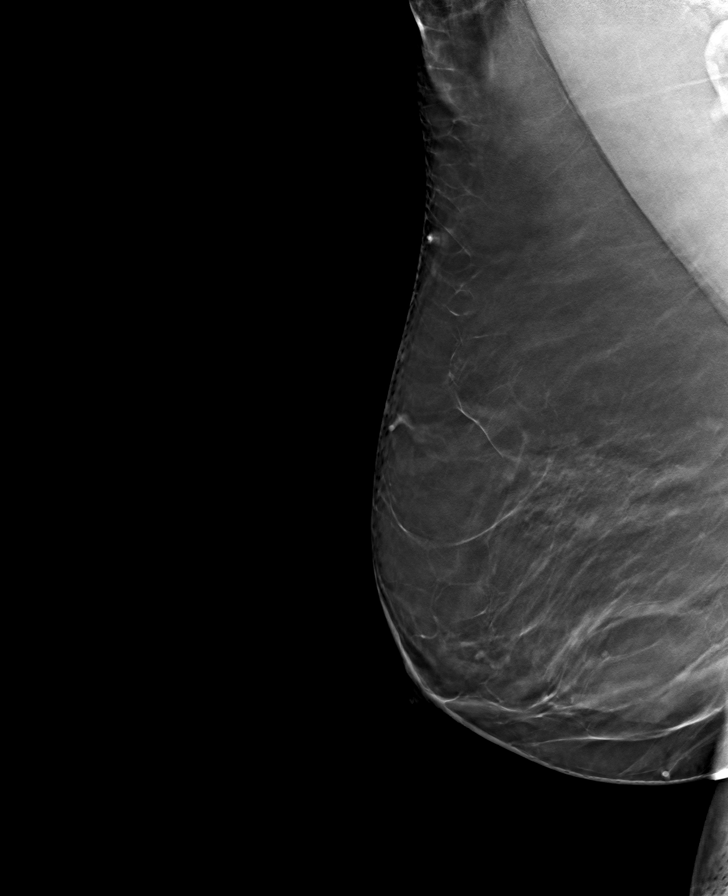

[8 of 24 positions shown; findings below may reference images not displayed]

ACR Breast Density Category b: There are scattered areas of
fibroglandular density.
FINDINGS: There are no findings suspicious for malignancy.
IMPRESSION: No mammographic evidence of malignancy. A result letter of this
screening mammogram will be mailed directly to the patient.

RECOMMENDATION:
Screening mammogram in one year. (Code:[id number]-U-VO0)

BI-RADS CATEGORY  1: Negative.

## 2023-09-24 DIAGNOSIS — M5416 Radiculopathy, lumbar region: Secondary | ICD-10-CM | POA: Diagnosis not present

## 2023-10-01 ENCOUNTER — Other Ambulatory Visit: Payer: Self-pay | Admitting: Internal Medicine

## 2023-10-20 ENCOUNTER — Ambulatory Visit: Admitting: Dermatology

## 2023-10-20 VITALS — BP 142/80

## 2023-10-20 DIAGNOSIS — L720 Epidermal cyst: Secondary | ICD-10-CM | POA: Diagnosis not present

## 2023-10-20 NOTE — Patient Instructions (Addendum)

## 2023-10-20 NOTE — Progress Notes (Unsigned)
   New Patient Visit   Subjective  [name] is a 78 y.o. female who presents for the following: 2 lesions of lower lip that has been there over a year and sometime itch.  Accompanied by husband today.  The following portions of the chart were reviewed this encounter and updated as appropriate: medications, allergies, medical history  Review of Systems:  No other skin or systemic complaints except as noted in HPI or Assessment and Plan.  Objective  Well appearing patient in no apparent distress; mood and affect are within normal limits.   A focused examination was performed of the following areas: Face   Relevant exam findings are noted in the Assessment and Plan.       Mid Lower Vermilion Lip White papule  Assessment & Plan      EPIDERMAL INCLUSION CYST Exam: Subcutaneous nodule at left oral commissure  Benign-appearing. Exam most consistent with an epidermal inclusion cyst. Discussed that a cyst is a benign growth that can grow over time and sometimes get irritated or inflamed. Recommend observation if it is not bothersome. Discussed option of surgical excision to remove it if it is growing, symptomatic, or other changes noted. Please call for new or changing lesions so they can be evaluated.  We will refer her to Dr Corey for excision  Milia of the lip Small, palpable lumps under the skin causing intermittent itching. Superficial cysts with a skin layer preventing self-extraction. - Excise smaller milia using sterile needle and alcohol for cleaning. - Apply pressure to ensure complete removal and prevent recurrence. - Provide Aquaphor for daily application to aid healing.  MILIA Mid Lower Vermilion Lip Acne/Milia surgery - Mid Lower Vermilion Lip Procedure risks and benefits were discussed with the patient and verbal consent was obtained. Following prep of the skin on the lower lip with an alcohol swab. Extraction of milia was performed with cotton tip  applicators following superficial incision made over their surfaces with an 18g needle. Advised patient to apply aquaphor daily while healing.The patient tolerated the procedure well.   Return for cyst excision with Dr Corey.  I, Roseline Hutchinson, CMA, am acting as scribe for [employer], DO .   Documentation: I have reviewed the above documentation for accuracy and completeness, and I agree with the above.  Delon Lenis, DO

## 2023-10-21 ENCOUNTER — Encounter: Payer: Self-pay | Admitting: Dermatology

## 2023-10-28 ENCOUNTER — Ambulatory Visit (INDEPENDENT_AMBULATORY_CARE_PROVIDER_SITE_OTHER)

## 2023-10-28 ENCOUNTER — Ambulatory Visit

## 2023-10-28 DIAGNOSIS — Z23 Encounter for immunization: Secondary | ICD-10-CM | POA: Diagnosis not present

## 2023-10-28 NOTE — Progress Notes (Signed)
 Patient is in office today for a nurse visit for Immunization. Patient Injection was given in the  Right deltoid. Patient tolerated injection well.

## 2023-11-12 ENCOUNTER — Ambulatory Visit (INDEPENDENT_AMBULATORY_CARE_PROVIDER_SITE_OTHER): Admitting: Family Medicine

## 2023-11-12 ENCOUNTER — Encounter: Payer: Self-pay | Admitting: Family Medicine

## 2023-11-12 VITALS — BP 132/80 | HR 73 | Temp 98.8°F | Ht 64.0 in | Wt 255.0 lb

## 2023-11-12 DIAGNOSIS — K668 Other specified disorders of peritoneum: Secondary | ICD-10-CM | POA: Diagnosis not present

## 2023-11-12 DIAGNOSIS — R10A1 Flank pain, right side: Secondary | ICD-10-CM | POA: Diagnosis not present

## 2023-11-12 NOTE — Progress Notes (Signed)
 Subjective:    Patient ID: [name], female    DOB: 02/13/1945, 78 y.o.   MRN: [id number]  Back Pain     In 2022, the patient had a CT scan of the abdomen and pelvis due to right lower quadrant pain.  At that time, a 2.1 cm benign-appearing cyst like structure was seen in the right adnexa thought to be a benign cyst like mass.  However gynecology was concerned because she has had a hysterectomy and bilateral salpingo-oophorectomy.  Repeat CT in 4/24 showed: IMPRESSION: Complex right adnexal cystic lesion again identified which has been slowly increasing in size since 2015. With this level of slow interval growth, benign lesion is likely. No free fluid. No follow-up imaging recommended. Note: This recommendation does not apply to premenarchal patients and to those with increased risk (genetic, family history, elevated tumor markers or other high-risk factors) of ovarian cancer. Reference: JACR 2020 Feb; 17(2):248-254   Colonic diverticula.   Bilateral benign renal cysts.   Previous cholecystectomy with a dilated biliary tree unchanged from previous examinations, likely related to post cholecystectomy state  Due to the pain, she saw GI earlier this year and had a colonoscopy 7/24.  5 polyps were removed but no cause for pain was identified.   10/16/22 Patient continues to report right lower quadrant abdominal pain.  The pain can be severe at times.  Other times it is manageable but is present on a daily basis.  The pain does not radiate.  Dull constant intense ache.  She denies any fevers or chills.  She denies any melena or hematochezia.  Laying flat on her back sometimes makes the pain better.  She denies any constipation.  She denies any nausea or vomiting.  She denies any dysuria or hematuria.  At that time, my plan was: Patient has chronic right lower quadrant abdominal pain.  CT scan has shown a benign cystlike mass in the right axilla area.  This is slowly growing for years.  I  do not think that this would be sufficient to cause pain.  The other possibility is adhesions from previous surgeries.  She has had a CAT scan as well as a GI consultation with no other causes identified.  GYN does not feel comfortable removing the cyst like mass.  Therefore I will consult general surgery for possible resection of the cystlike mass and also lysis of adhesions  11/12/23 General Surgery feels like there the lesion was most likely benign given the slow-growing.  I also consulted second oncologist for second opinion who agreed.  They also felt that surgery was too risky given the level of scar tissue that she has in the right lower quadrant.  Therefore they recommended no intervention.  Patient has been dealing with chronic right lower quadrant pain off and on for quite some time.  However earlier this week she developed pain in her right flank that radiates into her right lower quadrant.  She denies any nausea or vomiting.  She denies any hematuria.  She denies any dysuria.  She denies any fevers or chills.  She denies any melena or hematochezia.  She denies any constipation.  On the CT scan last year, the patient was found to have punctate right nephrolithiasis.  Question is whether this is trying to pass given the description and location of the pain.  Patient is concerned that the cystic lesion in the right lower quadrant is growing  Past Medical History:  Diagnosis Date   Allergy  Arthritis    arthritis -knees   Colon polyps    benign- last colonoscopy 09-2008   Depression    GERD (gastroesophageal reflux disease)    hx GERD   H/O seasonal allergies    no problems now   Hyperlipidemia    Hypertension    Murmur, heart    Spinal stenosis of lumbar region at multiple levels    Past Surgical History:  Procedure Laterality Date   ABDOMINAL HYSTERECTOMY     APPENDECTOMY     with gallbladder surgery   BUNIONECTOMY Bilateral    CHOLECYSTECTOMY     open-gallstones    COLONOSCOPY     EXCISION OF ADNEXAL MASS Left August 03, 2013   at [facility] [place]/right adnexal nodule,excision;benigh lymph node   OOPHORECTOMY Bilateral 2008   ovarian cyst   POLYPECTOMY     Current Outpatient Medications on File Prior to Visit  Medication Sig Dispense Refill   acetaminophen  (TYLENOL ) 650 MG CR tablet Take by mouth as needed for pain.     albuterol  (VENTOLIN  HFA) 108 (90 Base) MCG/ACT inhaler Inhale 2 puffs into the lungs every 4 (four) hours as needed for wheezing or shortness of breath. 9 g 0   diltiazem  (CARDIZEM  CD) 360 MG 24 hr capsule Take 1 capsule by mouth once daily 90 capsule 0   doxazosin  (CARDURA ) 2 MG tablet TAKE 1 TABLET BY MOUTH ONCE DAILY. APPOINTMENT REQUIRED FOR FUTURE REFILLS. 90 tablet 1   fluticasone  (FLONASE ) 50 MCG/ACT nasal spray Place 2 sprays into both nostrils daily. 16 g 2   furosemide  (LASIX ) 40 MG tablet Take 1 tablet by mouth daily. May take additional half tablet (20mg ) as needed for swelling 135 tablet 3   JARDIANCE  10 MG TABS tablet TAKE ONE TABLET BY MOUTH DAILY 90 tablet 0   lisinopril  (ZESTRIL ) 40 MG tablet Take 1 tablet (40 mg total) by mouth daily. 90 tablet 3   loratadine (CLARITIN) 10 MG tablet Take 1 tablet by mouth daily.     mometasone  (ELOCON ) 0.1 % cream Apply topically 2 (two) times daily. 30 g 1   Multiple Vitamins-Minerals (CENTRUM MINIS WOMEN 50+) TABS Take by mouth daily at 6 (six) AM.     omeprazole  (PRILOSEC) 40 MG capsule Take 1 capsule (40 mg total) by mouth daily. 90 capsule 3   Polyethylene Glycol 3350  (MIRALAX PO) Take by mouth as needed.     potassium chloride  (KLOR-CON  M) 10 MEQ tablet Take 1 tablet (10 mEq total) by mouth 2 (two) times daily. 90 tablet 1   Probiotic Product (PROBIOTIC DAILY PO) Take 1 capsule by mouth daily at 6 (six) AM. EARTH'S PEARL SUPPLEMENT     rosuvastatin  (CRESTOR ) 10 MG tablet Take 1 tablet (10 mg total) by mouth daily. 90 tablet 3   traMADol  (ULTRAM ) 50 MG tablet TAKE 1 TABLET BY MOUTH EVERY  8 HOURS AS NEEDED 30 tablet 0   No current facility-administered medications on file prior to visit.   Allergies  Allergen Reactions   Aldactone  [Spironolactone ] Rash   Social History   Socioeconomic History   Marital status: Married    Spouse name: Not on file   Number of children: Not on file   Years of education: Not on file   Highest education level: Not on file  Occupational History   Not on file  Tobacco Use   Smoking status: Never   Smokeless tobacco: Never  Vaping Use   Vaping status: Never Used  Substance and Sexual Activity  Alcohol use: No   Drug use: No   Sexual activity: Yes  Other Topics Concern   Not on file  Social History Narrative   Not on file   Social Drivers of Health   Financial Resource Strain: Low Risk  (06/25/2023)   Overall Financial Resource Strain (CARDIA)    Difficulty of Paying Living Expenses: Not hard at all  Food Insecurity: No Food Insecurity (06/25/2023)   Hunger Vital Sign    Worried About Running Out of Food in the Last Year: Never true    Ran Out of Food in the Last Year: Never true  Transportation Needs: No Transportation Needs (06/25/2023)   PRAPARE - Administrator, Civil Service (Medical): No    Lack of Transportation (Non-Medical): No  Physical Activity: Inactive (06/25/2023)   Exercise Vital Sign    Days of Exercise per Week: 0 days    Minutes of Exercise per Session: 0 min  Stress: No Stress Concern Present (06/25/2023)   [employer] of Occupational Health - Occupational Stress Questionnaire    Feeling of Stress : Not at all  Social Connections: Moderately Integrated (06/25/2023)   Social Connection and Isolation Panel    Frequency of Communication with Friends and Family: More than three times a week    Frequency of Social Gatherings with Friends and Family: Twice a week    Attends Religious Services: More than 4 times per year    Active Member of [employer] or Organizations: No    Attends Tax inspector Meetings: Never    Marital Status: Married  Catering manager Violence: Not At Risk (06/25/2023)   Humiliation, Afraid, Rape, and Kick questionnaire    Fear of Current or Ex-Partner: No    Emotionally Abused: No    Physically Abused: No    Sexually Abused: No      Review of Systems  Musculoskeletal:  Positive for back pain.  All other systems reviewed and are negative.      Objective:   Physical Exam Vitals reviewed.  Constitutional:      Appearance: Normal appearance.  Cardiovascular:     Rate and Rhythm: Normal rate and regular rhythm.     Pulses: Normal pulses.     Heart sounds: Murmur heard.  Pulmonary:     Effort: Pulmonary effort is normal. No respiratory distress.     Breath sounds: Normal breath sounds. No stridor. No wheezing, rhonchi or rales.    Chest:     Chest wall: No tenderness.  Abdominal:     General: Abdomen is flat. Bowel sounds are normal. There is no distension.     Palpations: Abdomen is soft.     Tenderness: There is no abdominal tenderness. There is no guarding or rebound.   Musculoskeletal:     Right lower leg: No edema.     Left lower leg: No edema.  Neurological:     Mental Status: She is alert.         Assessment & Plan:  Cystic lesion of abdominal viscera - Plan: CT ABDOMEN PELVIS W CONTRAST  Right flank pain I believe the patient is trying to pass a kidney stone.  There are no exacerbating or alleviating factors.  Her abdomen is soft nondistended and nontender with normal bowel sounds.  She has no guarding.  Palpation does not exacerbate the pain.  I will obtain a CT scan of the abdomen pelvis to reevaluate the right lower quadrant cystic mass.  Stable in size and  no other abnormalities were found, the patient may have passed a kidney stone.  If gradually enlarging in size, we may consider interventional radiology for a biopsy

## 2023-11-17 DIAGNOSIS — H2513 Age-related nuclear cataract, bilateral: Secondary | ICD-10-CM | POA: Diagnosis not present

## 2023-11-17 DIAGNOSIS — H04123 Dry eye syndrome of bilateral lacrimal glands: Secondary | ICD-10-CM | POA: Diagnosis not present

## 2023-11-19 ENCOUNTER — Telehealth: Payer: Self-pay

## 2023-11-19 NOTE — Telephone Encounter (Signed)
 Copied from CRM [id number]. Topic: General - Other >> Nov 19, 2023 10:20 AM Kevelyn M wrote: Reason for CRM: [facility] called to let provider know that the patient's appointment was cancelled because there authorization is still pending. They will call patient back once authorization goes through.

## 2023-11-20 ENCOUNTER — Other Ambulatory Visit

## 2023-11-20 ENCOUNTER — Other Ambulatory Visit: Payer: Self-pay | Admitting: Family Medicine

## 2023-11-20 DIAGNOSIS — I10 Essential (primary) hypertension: Secondary | ICD-10-CM

## 2023-11-20 DIAGNOSIS — I5032 Chronic diastolic (congestive) heart failure: Secondary | ICD-10-CM

## 2023-12-01 ENCOUNTER — Ambulatory Visit
Admission: RE | Admit: 2023-12-01 | Discharge: 2023-12-01 | Disposition: A | Discharge status: Home / Self Care | Source: Ambulatory Visit | Attending: Family Medicine | Admitting: Family Medicine

## 2023-12-01 DIAGNOSIS — K5730 Diverticulosis of large intestine without perforation or abscess without bleeding: Secondary | ICD-10-CM | POA: Diagnosis not present

## 2023-12-01 DIAGNOSIS — K668 Other specified disorders of peritoneum: Secondary | ICD-10-CM

## 2023-12-01 DIAGNOSIS — N281 Cyst of kidney, acquired: Secondary | ICD-10-CM | POA: Diagnosis not present

## 2023-12-01 MED ORDER — IOPAMIDOL (ISOVUE-300) INJECTION 61%
100.0000 mL | Freq: Once | INTRAVENOUS | Status: AC | PRN
  Administered 2023-12-01: 100 mL via INTRAVENOUS

## 2023-12-07 ENCOUNTER — Ambulatory Visit: Payer: Self-pay | Admitting: Family Medicine

## 2023-12-11 ENCOUNTER — Telehealth: Payer: Self-pay

## 2023-12-12 ENCOUNTER — Other Ambulatory Visit: Payer: Self-pay | Admitting: Physician Assistant

## 2023-12-18 ENCOUNTER — Other Ambulatory Visit: Payer: Self-pay | Admitting: Physician Assistant

## 2023-12-18 NOTE — Telephone Encounter (Signed)
 Pt is scheduled 01/05/2024 @ 12 pm

## 2023-12-21 ENCOUNTER — Other Ambulatory Visit: Payer: Self-pay | Admitting: Family Medicine

## 2023-12-21 DIAGNOSIS — M1990 Unspecified osteoarthritis, unspecified site: Secondary | ICD-10-CM

## 2023-12-21 DIAGNOSIS — G8929 Other chronic pain: Secondary | ICD-10-CM

## 2023-12-22 ENCOUNTER — Telehealth: Payer: Self-pay | Admitting: Internal Medicine

## 2023-12-22 ENCOUNTER — Other Ambulatory Visit: Payer: Self-pay

## 2023-12-22 DIAGNOSIS — Z79899 Other long term (current) drug therapy: Secondary | ICD-10-CM

## 2023-12-22 DIAGNOSIS — I10 Essential (primary) hypertension: Secondary | ICD-10-CM

## 2023-12-22 NOTE — Telephone Encounter (Signed)
 Pt c/o swelling in both lower legs and feet that started about 2 weeks ago, worsening in RLE. States she does take Furosemide  daily and has been taking the prn half dose every day. Swelling does improve some at night but not much improvement when elevating during the day. No dietary changes. Pt advised to continue elevating her feet and verbalized understanding. No further symptoms. No SOB. Pt has scheduled appointment 12/31. Will send this to MD and PA for any further recommendations.

## 2023-12-22 NOTE — Telephone Encounter (Signed)
 Pt c/o swelling/edema: STAT if pt has developed SOB within 24 hours  If swelling, where is the swelling located?   Both feet and ankles.  Tightness in right leg to knee  How much weight have you gained and in what time span?   No  Have you gained 2 pounds in a day or 5 pounds in a week?   No  Do you have a log of your daily weights (if so, list)?     Usually 247-252 pounds  Are you currently taking a fluid pill?   Yes  Are you currently SOB?   No  Have you traveled recently in a car or plane for an extended period of time?   No  Patient stated she's taking her fluid pill but she is not releasing a lot of fluid.

## 2023-12-22 NOTE — Telephone Encounter (Signed)
 Spoke with Pt. Recommendations given. Labs ordered and released. Pt stated understanding.

## 2023-12-22 NOTE — Telephone Encounter (Signed)
 Requested medications are due for refill today.  yes  Requested medications are on the active medications list.  yes  Last refill. 09/10/2023 #30 0 rf  Future visit scheduled.   yes  Notes to clinic.  Refill not delegated.    Requested Prescriptions  Pending Prescriptions Disp Refills   traMADol  (ULTRAM ) 50 MG tablet [Pharmacy Med Name: traMADol  HCl 50 MG Oral Tablet] 30 tablet 0    Sig: TAKE 1 TABLET BY MOUTH EVERY 8 HOURS AS NEEDED     Not Delegated - Analgesics:  Opioid Agonists Failed - 12/22/2023  3:43 PM      Failed - This refill cannot be delegated      Failed - Urine Drug Screen completed in last 360 days      Failed - Valid encounter within last 3 months    Recent Outpatient Visits           1 month ago Cystic lesion of abdominal viscera   [place] [facility] Duanne Butler DASEN, MD   5 months ago Lesion of lip   [place] [facility] Duanne Butler DASEN, MD   6 months ago Atopic dermatitis, unspecified type   [place] [facility] Duanne Butler DASEN, MD   7 months ago Arthritis   [place] [facility] Duanne Butler DASEN, MD   1 year ago Right lower quadrant abdominal pain   [place] [facility] [place], Butler DASEN, MD       Future Appointments             In 1 month Darryle Thom CROME, PA-C [facility] at [employer] [facility] [employer]. [facility] [employer], [employer]

## 2023-12-28 DIAGNOSIS — M5416 Radiculopathy, lumbar region: Secondary | ICD-10-CM | POA: Diagnosis not present

## 2024-01-01 ENCOUNTER — Other Ambulatory Visit: Payer: Self-pay

## 2024-01-04 MED ORDER — FUROSEMIDE 40 MG PO TABS: ORAL_TABLET | ORAL | 0 refills | Status: DC

## 2024-01-04 MED ORDER — LISINOPRIL 40 MG PO TABS: 40.0000 mg | ORAL_TABLET | Freq: Every day | ORAL | 0 refills | Status: DC

## 2024-01-04 MED ORDER — ROSUVASTATIN CALCIUM 10 MG PO TABS: 10.0000 mg | ORAL_TABLET | Freq: Every day | ORAL | 0 refills | Status: DC

## 2024-01-05 ENCOUNTER — Encounter: Payer: Self-pay | Admitting: Family Medicine

## 2024-01-05 ENCOUNTER — Ambulatory Visit: Admitting: Family Medicine

## 2024-01-05 VITALS — BP 162/100 | HR 76 | Temp 98.0°F | Ht 64.0 in | Wt 262.6 lb

## 2024-01-05 DIAGNOSIS — R1031 Right lower quadrant pain: Secondary | ICD-10-CM | POA: Diagnosis not present

## 2024-01-05 DIAGNOSIS — R10A1 Flank pain, right side: Secondary | ICD-10-CM | POA: Diagnosis not present

## 2024-01-05 DIAGNOSIS — K668 Other specified disorders of peritoneum: Secondary | ICD-10-CM | POA: Diagnosis not present

## 2024-01-05 MED ORDER — DOXYCYCLINE HYCLATE 100 MG PO TABS: 100.0000 mg | ORAL_TABLET | Freq: Two times a day (BID) | ORAL | 0 refills | Status: AC

## 2024-01-05 NOTE — Progress Notes (Signed)
 Subjective:    Patient ID: [name], female    DOB: May 08, 1945, 78 y.o.   MRN: [id number]  Back Pain    In 2022, the patient had a CT scan of the abdomen and pelvis due to right lower quadrant pain.  At that time, a 2.1 cm benign-appearing cyst like structure was seen in the right adnexa thought to be a benign cyst like mass.  However gynecology was concerned because she has had a hysterectomy and bilateral salpingo-oophorectomy.  Repeat CT in 4/24 showed: IMPRESSION: Complex right adnexal cystic lesion again identified which has been slowly increasing in size since 2015. With this level of slow interval growth, benign lesion is likely. No free fluid. No follow-up imaging recommended. Note: This recommendation does not apply to premenarchal patients and to those with increased risk (genetic, family history, elevated tumor markers or other high-risk factors) of ovarian cancer. Reference: JACR 2020 Feb; 17(2):248-254   Colonic diverticula.   Bilateral benign renal cysts.   Previous cholecystectomy with a dilated biliary tree unchanged from previous examinations, likely related to post cholecystectomy state  Due to the pain, she saw GI earlier this year and had a colonoscopy 7/24.  5 polyps were removed but no cause for pain was identified.   10/16/22 Patient continues to report right lower quadrant abdominal pain.  The pain can be severe at times.  Other times it is manageable but is present on a daily basis.  The pain does not radiate.  Dull constant intense ache.  She denies any fevers or chills.  She denies any melena or hematochezia.  Laying flat on her back sometimes makes the pain better.  She denies any constipation.  She denies any nausea or vomiting.  She denies any dysuria or hematuria.  At that time, my plan was: Patient has chronic right lower quadrant abdominal pain.  CT scan has shown a benign cystlike mass in the right axilla area.  This is slowly growing for years.  I  do not think that this would be sufficient to cause pain.  The other possibility is adhesions from previous surgeries.  She has had a CAT scan as well as a GI consultation with no other causes identified.  GYN does not feel comfortable removing the cyst like mass.  Therefore I will consult general surgery for possible resection of the cystlike mass and also lysis of adhesions  11/12/23 General Surgery feels like there the lesion was most likely benign given the slow-growing.  I also consulted second oncologist for second opinion who agreed.  They also felt that surgery was too risky given the level of scar tissue that she has in the right lower quadrant.  Therefore they recommended no intervention.  Patient has been dealing with chronic right lower quadrant pain off and on for quite some time.  However earlier this week she developed pain in her right flank that radiates into her right lower quadrant.  She denies any nausea or vomiting.  She denies any hematuria.  She denies any dysuria.  She denies any fevers or chills.  She denies any melena or hematochezia.  She denies any constipation.  On the CT scan last year, the patient was found to have punctate right nephrolithiasis.  Question is whether this is trying to pass given the description and location of the pain.  Patient is concerned that the cystic lesion in the right lower quadrant is growing.   At that time, my plan was: I believe the  patient is trying to pass a kidney stone.  There are no exacerbating or alleviating factors.  Her abdomen is soft nondistended and nontender with normal bowel sounds.  She has no guarding.  Palpation does not exacerbate the pain.  I will obtain a CT scan of the abdomen pelvis to reevaluate the right lower quadrant cystic mass.  Stable in size and no other abnormalities were found, the patient may have passed a kidney stone.  If gradually enlarging in size, we may consider interventional radiology for a  biopsy: IMPRESSION: 1. No acute findings in the abdomen or pelvis. 2. 2.9 x 2.2 cm cystic lesion in the right adnexal space measures minimally larger in the interval since the 2024 exam, measuring 2.7 x 2.1 cm on that study. As noted on the prior exam this lesion has shown slow continued growth since 2015. This relatively slow growth over time is suggestive of benign etiology. Given the history of right lower quadrant pain, consider surveillance to ensure stability. 3. 8 mm hypodensity in the head of the pancreas is stable in the interval since 2024 but new since a study from 2022 . This is likely a benign etiology such as a cyst or pseudocyst. Follow-up MRI of the abdomen with and without contrast recommended in 1 year. 4. 4 mm nonobstructing stone lower pole left kidney. 5. Left colonic diverticulosis without diverticulitis. 6.  Aortic Atherosclerosis (ICD10-I70.0).  01/05/24 As show above, the patient's CT scan showed no other explanation for the right lower quadrant abdominal pain other than the small cystic lesion in the right adnexal space.  I believe that this lesion is likely encased in adhesions due to her previous abdominal surgeries and that this could be a source of her pain.  She has been dealing with this pain off and on since 2022.  However she states that it got much worse in October of this year.  She denies any melena or hematochezia.  She denies any fevers or chills.  She denies any nausea or vomiting.  She denies any dysuria or hematuria.  She had a colonoscopy in 2024 that showed several tubular adenomas but no other explanation for her abdominal pain.  Therefore I believe we have done a thorough diagnostic workup to rule out other causes of her abdominal pain.  Patient also has constant low back pain.  She had an MRI in 2020 which revealed: IMPRESSION: Multilevel severe congenital and acquired stenosis, L2-3, L3-4, L4-5, and L5-S1 as described.   All could contribute to  low back pain with BILATERAL radicular symptoms, although stenosis appears most severe at L4-5 due to superimposed 5 mm anterolisthesis. Dynamic instability, if present, could worsen impingement at this level.   In addition, predominant RIGHT-sided symptoms are more likely at L3-4 where a suspected extraforaminal protrusion and RIGHT-sided intraspinal, possible extra-spinal, synovial cysts are observed. See discussion above. I question if some of her back pain could potentially radiate into her lower abdomen and some of this pain may even be neuropathic in nature.  She is currently receiving a cortisone injection in her back later this week.  She is questioning if she can take tramadol  for pain.  At present she is using tramadol  1 tablet every other day as needed for pain.  I explained to her that I have no concern about her using the tramadol  that infrequently.  I will be glad to refill the tramadol .  She is taking 30 tablets and this often last for more than 2 months.  I do not see any long-term risk in her doing this if it helps to manage the pain.    Past Medical History:  Diagnosis Date   Allergy    Arthritis    arthritis -knees   Colon polyps    benign- last colonoscopy 09-2008   Depression    GERD (gastroesophageal reflux disease)    hx GERD   H/O seasonal allergies    no problems now   Hyperlipidemia    Hypertension    Murmur, heart    Spinal stenosis of lumbar region at multiple levels    Past Surgical History:  Procedure Laterality Date   ABDOMINAL HYSTERECTOMY     APPENDECTOMY     with gallbladder surgery   BUNIONECTOMY Bilateral    CHOLECYSTECTOMY     open-gallstones   COLONOSCOPY     EXCISION OF ADNEXAL MASS Left 08/03/2013   at [facility]/right adnexal nodule,excision;benigh lymph node   OOPHORECTOMY Bilateral 2008   ovarian cyst   POLYPECTOMY     Current Outpatient Medications on File Prior to Visit  Medication Sig Dispense Refill   acetaminophen   (TYLENOL ) 650 MG CR tablet Take by mouth as needed for pain.     albuterol  (VENTOLIN  HFA) 108 (90 Base) MCG/ACT inhaler Inhale 2 puffs into the lungs every 4 (four) hours as needed for wheezing or shortness of breath. 9 g 0   diltiazem  (CARDIZEM  CD) 360 MG 24 hr capsule Take 1 capsule by mouth once daily 90 capsule 0   doxazosin  (CARDURA ) 2 MG tablet TAKE 1 TABLET BY MOUTH ONCE DAILY. APPOINTMENT REQUIRED FOR FUTURE REFILLS. 90 tablet 1   fluticasone  (FLONASE ) 50 MCG/ACT nasal spray Place 2 sprays into both nostrils daily. 16 g 2   furosemide  (LASIX ) 40 MG tablet Take 1 tablet by mouth daily. May take additional half tablet (20mg ) as needed for swelling 60 tablet 0   JARDIANCE  10 MG TABS tablet TAKE ONE TABLET BY MOUTH DAILY 90 tablet 0   lisinopril  (ZESTRIL ) 40 MG tablet Take 1 tablet (40 mg total) by mouth daily. 30 tablet 0   loratadine (CLARITIN) 10 MG tablet Take 1 tablet by mouth daily.     mometasone  (ELOCON ) 0.1 % cream Apply topically 2 (two) times daily. 30 g 1   Multiple Vitamins-Minerals (CENTRUM MINIS WOMEN 50+) TABS Take by mouth daily at 6 (six) AM.     omeprazole  (PRILOSEC) 40 MG capsule Take 1 capsule (40 mg total) by mouth daily. 90 capsule 3   Polyethylene Glycol 3350  (MIRALAX PO) Take by mouth as needed.     potassium chloride  (KLOR-CON  M) 10 MEQ tablet Take 1 tablet (10 mEq total) by mouth 2 (two) times daily. 90 tablet 1   Probiotic Product (PROBIOTIC DAILY PO) Take 1 capsule by mouth daily at 6 (six) AM. EARTH'S PEARL SUPPLEMENT     rosuvastatin  (CRESTOR ) 10 MG tablet Take 1 tablet (10 mg total) by mouth daily. 30 tablet 0   traMADol  (ULTRAM ) 50 MG tablet TAKE 1 TABLET BY MOUTH EVERY 8 HOURS AS NEEDED 30 tablet 0   No current facility-administered medications on file prior to visit.   Allergies  Allergen Reactions   Aldactone  [Spironolactone ] Rash   Social History   Socioeconomic History   Marital status: Married    Spouse name: Not on file   Number of children:  Not on file   Years of education: Not on file   Highest education level: Not on file  Occupational History  Not on file  Tobacco Use   Smoking status: Never   Smokeless tobacco: Never  Vaping Use   Vaping status: Never Used  Substance and Sexual Activity   Alcohol use: No   Drug use: No   Sexual activity: Yes  Other Topics Concern   Not on file  Social History Narrative   Not on file   Social Drivers of Health   Financial Resource Strain: Low Risk  (06/25/2023)   Overall Financial Resource Strain (CARDIA)    Difficulty of Paying Living Expenses: Not hard at all  Food Insecurity: No Food Insecurity (06/25/2023)   Hunger Vital Sign    Worried About Running Out of Food in the Last Year: Never true    Ran Out of Food in the Last Year: Never true  Transportation Needs: No Transportation Needs (06/25/2023)   PRAPARE - Administrator, Civil Service (Medical): No    Lack of Transportation (Non-Medical): No  Physical Activity: Inactive (06/25/2023)   Exercise Vital Sign    Days of Exercise per Week: 0 days    Minutes of Exercise per Session: 0 min  Stress: No Stress Concern Present (06/25/2023)   [employer] of Occupational Health - Occupational Stress Questionnaire    Feeling of Stress : Not at all  Social Connections: Moderately Integrated (06/25/2023)   Social Connection and Isolation Panel    Frequency of Communication with Friends and Family: More than three times a week    Frequency of Social Gatherings with Friends and Family: Twice a week    Attends Religious Services: More than 4 times per year    Active Member of [employer] or Organizations: No    Attends Banker Meetings: Never    Marital Status: Married  Catering Manager Violence: Not At Risk (06/25/2023)   Humiliation, Afraid, Rape, and Kick questionnaire    Fear of Current or Ex-Partner: No    Emotionally Abused: No    Physically Abused: No    Sexually Abused: No      Review of Systems   Musculoskeletal:  Positive for back pain.  All other systems reviewed and are negative.      Objective:   Physical Exam Vitals reviewed.  Constitutional:      Appearance: Normal appearance.  Cardiovascular:     Rate and Rhythm: Normal rate and regular rhythm.     Pulses: Normal pulses.     Heart sounds: Murmur heard.  Pulmonary:     Effort: Pulmonary effort is normal. No respiratory distress.     Breath sounds: Normal breath sounds. No stridor. No wheezing, rhonchi or rales.    Chest:     Chest wall: No tenderness.  Abdominal:     General: Abdomen is flat. Bowel sounds are normal. There is no distension.     Palpations: Abdomen is soft.     Tenderness: There is no abdominal tenderness. There is no guarding or rebound.   Musculoskeletal:     Right lower leg: No edema.     Left lower leg: No edema.  Neurological:     Mental Status: She is alert.         Assessment & Plan:  Cystic lesion of abdominal viscera  Right flank pain  Right lower quadrant abdominal pain I believe the patient's right lower quadrant abdominal pain is multifactorial and likely related to the cystic lesion in the right adnexa, possible scar tissue and adhesions in the right adnexa adjacent to the cystic lesion,  and possibly referred pain from her lower back due to her severe spinal stenosis at L4-L5 and L5-S1.  At the present time, I believe we are going to have to manage the patient symptomatically.  She is receiving an epidural steroid injection in a few days for her low back pain.  I explained to the patient I will be glad to refill her tramadol  as needed.  She is currently using 1 pill every other day.  I see no risk in this long-term.

## 2024-01-06 ENCOUNTER — Ambulatory Visit: Admitting: Dermatology

## 2024-01-06 NOTE — Progress Notes (Deleted)
° °  Follow-Up Visit   Subjective  [name] is a 78 y.o. female who presents for the following: Excision of left oral commissure  The following portions of the chart were reviewed this encounter and updated as appropriate: medications, allergies, medical history  Review of Systems:  No other skin or systemic complaints except as noted in HPI or Assessment and Plan.  Objective  Well appearing patient in no apparent distress; mood and affect are within normal limits.  A focused examination was performed of the following areas: Left oral commissure Relevant physical exam findings are noted in the Assessment and Plan.     Assessment & Plan      No follow-ups on file.  ***  Documentation: I have reviewed the above documentation for accuracy and completeness, and I agree with the above.  RUFUS CHRISTELLA HOLY, MD

## 2024-01-07 DIAGNOSIS — M5416 Radiculopathy, lumbar region: Secondary | ICD-10-CM | POA: Diagnosis not present

## 2024-01-14 DIAGNOSIS — J209 Acute bronchitis, unspecified: Secondary | ICD-10-CM

## 2024-01-26 LAB — BASIC METABOLIC PANEL WITH GFR
BUN/Creatinine Ratio: 17 (ref 12–28)
BUN: 16 mg/dL (ref 8–27)
CO2: 28 mmol/L (ref 20–29)
Calcium: 9.3 mg/dL (ref 8.7–10.3)
Chloride: 100 mmol/L (ref 96–106)
Creatinine, Ser: 0.94 mg/dL (ref 0.57–1.00)
Glucose: 104 mg/dL — ABNORMAL HIGH (ref 70–99)
Potassium: 3.4 mmol/L — ABNORMAL LOW (ref 3.5–5.2)
Sodium: 143 mmol/L (ref 134–144)
eGFR: 63 mL/min/1.73

## 2024-01-26 LAB — PRO B NATRIURETIC PEPTIDE: NT-Pro BNP: 170 pg/mL (ref 0–738)

## 2024-01-26 NOTE — Progress Notes (Unsigned)
 " Cardiology Office Note:  .   Date:  01/27/2024  ID:  [name], DOB 1945-10-07, MRN [id number] PCP: Duanne Butler DASEN, MD  [facility] Providers Cardiologist:  Stanly DELENA Leavens, MD {  History of Present Illness: .   [name] is a 78 y.o. female with history of hypertension, hyperlipidemia, LVH     Cardiomyopathy 11/2019 echocardiogram with preserved EF.  Severe asymmetric LVH. 02/2020 cardiac MRI ordered with asymmetric basal septal hypertrophy, LVEF 62%.  No LGE.  Findings not supportive of hypertrophic cardiomyopathy. 09/2021 LVEF 60-65%, severe hypertrophy of basal septum.  No valve disease.  Pulmonary 05/2023 PFT with FEV1/FVC ratio 0.44  Social history  No history of tobacco, alcohol, drug use.  Lightly active 1 to 2 days/week walking.     Patient with history of asymmetric basal septal hypertrophy although cardiac MRI without any findings supportive of true HCM.  Seen last 12/2022 without any complaints, EKG showed PVC. Recently patient contacted our office reporting lower extremity edema x 2 weeks.  Was taking Lasix  40mg  daily and as needed half dose every day without much improvement.  Lasix  increased to 60 mg, previously on Lasix  40 mg.  proBNP 170.  BMP with hypokalemia 3.4, normal creatinine.  Today patient presents for annual follow-up.  She reports isolated peripheral edema that has improved significantly on the current regimen and without any exertional/respiratory complaints.  Denied any shortness of breath, DOE, orthopnea with her swelling.  Has had no chest pain.  She is not very active right now but still very much involved with taking care of grandchildren.  She has a new use resolution of try to be more active and walking much more with a focus on weight loss.  She does report to me that she has not been on potassium for over 6+ months.  She also reports she has been on Jardiance  and has been approved for patient assistance.  Of note previous weight   262 pounds in the setting of heavy clothing.  Baseline weight closer to 248 pounds.   ROS: Denies: Chest pain, shortness of breath, orthopnea,  palpitations, syncope, decreased exercise capacity, fatigue, dizziness.   Studies Reviewed: [name]    EKG Interpretation Date/Time:  Wednesday January 27 2024 09:05:34 EST Ventricular Rate:  70 PR Interval:  170 QRS Duration:  92 QT Interval:  410 QTC Calculation: 442 R Axis:   -28  Text Interpretation: Normal sinus rhythm Minimal voltage criteria for LVH, may be normal variant ( R in aVL ) When compared with ECG of 29-Dec-2022 08:46, Premature ventricular complexes are no longer Present Criteria for Septal infarct are no longer Present Confirmed by Darryle Currier [phone]) on 01/27/2024 9:08:59 AM    Risk Assessment/Calculations:           Physical Exam:   VS:  BP 126/78   Pulse 70   Ht 5' 4 (1.626 m)   Wt 248 lb 12.8 oz (112.9 kg)   SpO2 98%   BMI 42.71 kg/m    Wt Readings from Last 3 Encounters:  01/27/24 248 lb 12.8 oz (112.9 kg)  01/05/24 262 lb 9.6 oz (119.1 kg)  11/12/23 255 lb (115.7 kg)    GEN: Well nourished, well developed in no acute distress NECK: No JVD; No carotid bruits CARDIAC: RRR, no murmurs, rubs, gallops RESPIRATORY:  Clear to auscultation without rales, wheezing or rhonchi  ABDOMEN: Soft, non-tender, non-distended EXTREMITIES: 1+ edema; No deformity   ASSESSMENT AND PLAN: .  Leg swelling Cardiomyopathy  -02/2020 cardiac MRI ordered with asymmetric basal septal hypertrophy, LVEF 62%.  No LGE.  Findings not supportive of hypertrophic cardiomyopathy. - 09/2021 echo with severe hypertrophy of the basal septum.  Preserved biventricular function.  No significant valve disease. She has isolated lower extremity edema without any respiratory/exertional complaints.  proBNP of 170 would argue against true CHF although BMI may make it deceptively low.  Having improved edema with increase of Lasix  and happy with her swelling  today. Will hold off on echocardiogram since she has had improvement in symptoms and no respiratory complaints. GDMT: Continue with diltiazem  360 mg daily, Jardiance  10 mg, Lasix  60 mg daily.  Start potassium 40 mEq daily.  Has had persistent hypokalemia but was not taking potassium supplementation before.  Repeat BMP Monday. Reported allergy to MRA (rash).   Encouraged compression stockings as well. Weight today 248 reflecting downtrend from previous values.  Hypertension Blood pressure is well-controlled 126/78.  Continue lisinopril  40 mg daily, and diltiazem .  Hyperlipidemia LDL is well-controlled.  06/2023 LDL 57.  Continue rosuvastatin  10 mg.  BMI 42 Her goal is weight loss for the upcoming year.  Encouraged more activity and this is likely to help her swelling as well.    Dispo: 66-month follow-up to ensure volume status is still okay on current regiment.  If doing okay at that time likely can go back to annual follow-ups.  Addendum: Potassium was accidentally ordered as 40 mill equivalents twice daily.  40 mill equivalents daily will be corrected and patient will be called to clarify.  Signed, Thom LITTIE Sluder, PA-C  "

## 2024-01-27 ENCOUNTER — Encounter: Payer: Self-pay | Admitting: Cardiology

## 2024-01-27 ENCOUNTER — Ambulatory Visit: Admitting: Cardiology

## 2024-01-27 VITALS — BP 126/78 | HR 70 | Ht 64.0 in | Wt 248.8 lb

## 2024-01-27 DIAGNOSIS — Z79899 Other long term (current) drug therapy: Secondary | ICD-10-CM

## 2024-01-27 DIAGNOSIS — I5030 Unspecified diastolic (congestive) heart failure: Secondary | ICD-10-CM | POA: Diagnosis not present

## 2024-01-27 DIAGNOSIS — I5032 Chronic diastolic (congestive) heart failure: Secondary | ICD-10-CM | POA: Diagnosis not present

## 2024-01-27 DIAGNOSIS — I429 Cardiomyopathy, unspecified: Secondary | ICD-10-CM | POA: Diagnosis not present

## 2024-01-27 DIAGNOSIS — I10 Essential (primary) hypertension: Secondary | ICD-10-CM

## 2024-01-27 DIAGNOSIS — E785 Hyperlipidemia, unspecified: Secondary | ICD-10-CM

## 2024-01-27 DIAGNOSIS — E876 Hypokalemia: Secondary | ICD-10-CM

## 2024-01-27 MED ORDER — JARDIANCE 10 MG PO TABS: 10.0000 mg | ORAL_TABLET | Freq: Every day | ORAL | 3 refills | Status: AC

## 2024-01-27 MED ORDER — FUROSEMIDE 40 MG PO TABS: ORAL_TABLET | ORAL | 3 refills | Status: DC

## 2024-01-27 MED ORDER — LISINOPRIL 40 MG PO TABS: 40.0000 mg | ORAL_TABLET | Freq: Every day | ORAL | 3 refills | Status: AC

## 2024-01-27 MED ORDER — ROSUVASTATIN CALCIUM 10 MG PO TABS: 10.0000 mg | ORAL_TABLET | Freq: Every day | ORAL | 3 refills | Status: AC

## 2024-01-27 MED ORDER — POTASSIUM CHLORIDE CRYS ER 20 MEQ PO TBCR: 40.0000 meq | EXTENDED_RELEASE_TABLET | Freq: Two times a day (BID) | ORAL | 3 refills | Status: DC

## 2024-01-27 MED ORDER — POTASSIUM CHLORIDE CRYS ER 20 MEQ PO TBCR: 40.0000 meq | EXTENDED_RELEASE_TABLET | Freq: Every day | ORAL | 3 refills | Status: AC

## 2024-01-27 NOTE — Patient Instructions (Signed)
 Medication Instructions:  Start Potassium  40mg  daily *If you need a refill on your cardiac medications before your next appointment, please call your pharmacy*  Lab Work: Monday:BMET If you have labs (blood work) drawn today and your tests are completely normal, you will receive your results only by: MyChart Message (if you have MyChart) OR A paper copy in the mail If you have any lab test that is abnormal or we need to change your treatment, we will call you to review the results.  Testing/Procedures: None   Follow-Up: At [facility], you and your health needs are our priority.  As part of our continuing mission to provide you with exceptional heart care, our providers are all part of one team.  This team includes your primary Cardiologist (physician) and Advanced Practice Providers or APPs (Physician Assistants and Nurse Practitioners) who all work together to provide you with the care you need, when you need it.  Your next appointment:   2 month(s)  Provider:   Stanly DELENA Leavens, MD    We recommend signing up for the patient portal called MyChart.  Sign up information is provided on this After Visit Summary.  MyChart is used to connect with patients for Virtual Visits (Telemedicine).  Patients are able to view lab/test results, encounter notes, upcoming appointments, etc.  Non-urgent messages can be sent to your provider as well.   To learn more about what you can do with MyChart, go to [email or web address].   Other Instructions None

## 2024-01-27 NOTE — Addendum Note (Signed)
 Addended by: Joanthan Hlavacek on: 01/27/2024 05:06 PM   Modules accepted: Orders

## 2024-02-02 ENCOUNTER — Telehealth: Payer: Self-pay | Admitting: Pharmacy Technician

## 2024-02-02 ENCOUNTER — Other Ambulatory Visit (HOSPITAL_COMMUNITY): Payer: Self-pay

## 2024-02-02 LAB — BASIC METABOLIC PANEL WITH GFR
BUN/Creatinine Ratio: 12 (ref 12–28)
BUN: 13 mg/dL (ref 8–27)
CO2: 25 mmol/L (ref 20–29)
Calcium: 9.7 mg/dL (ref 8.7–10.3)
Chloride: 100 mmol/L (ref 96–106)
Creatinine, Ser: 1.1 mg/dL — ABNORMAL HIGH (ref 0.57–1.00)
Glucose: 78 mg/dL (ref 70–99)
Potassium: 4.4 mmol/L (ref 3.5–5.2)
Sodium: 144 mmol/L (ref 134–144)
eGFR: 52 mL/min/1.73 — ABNORMAL LOW

## 2024-02-02 NOTE — Telephone Encounter (Signed)
 Hi, can someone please get the provider to sign the form that is scanned in media under Jardiance  PAP for this patient and fax to [phone]? Thank you!

## 2024-02-03 ENCOUNTER — Ambulatory Visit: Payer: Self-pay | Admitting: Cardiology

## 2024-02-03 NOTE — Telephone Encounter (Addendum)
 MD signed pt assistance paperwork faxed to [phone].

## 2024-02-04 NOTE — Telephone Encounter (Signed)
 Faxed provider application to [facility]

## 2024-02-08 ENCOUNTER — Other Ambulatory Visit (HOSPITAL_COMMUNITY): Payer: Self-pay

## 2024-02-08 ENCOUNTER — Other Ambulatory Visit: Payer: Self-pay | Admitting: Physician Assistant

## 2024-02-08 NOTE — Telephone Encounter (Signed)
 Faxed bi-cares missing information

## 2024-02-10 NOTE — Telephone Encounter (Signed)
 Patient Advocate Encounter   The patient was approved for a [employer] grant that will help cover the cost of jardiance  Total amount awarded, 7500.  Effective: 01/10/24 - 01/08/25   APW:[id number] ERW:[id number] Hmnle:[id number] PI:[id number] [employer] ID: [id number]   Pharmacy provided with approval and processing information. Patient informed via mychart   Sent to [facility]

## 2024-02-16 NOTE — Progress Notes (Unsigned)
 "    02/17/2024 [name] [id number] [birth year]  Referring provider: Duanne Butler DASEN, MD Primary GI doctor: Dr. Federico  ASSESSMENT AND PLAN:  Loose stools, stool leakage and mucus in stools x 1 year BM with every urination, can have 3-4 Bm's in the morning, formed to liquids stools Denies fever, chills Can not feel stool come out, small to medium volume, loose stools, no hematochezia, no rectal discomfort Due for colonoscopy 07/2025 Chronic fecal incontinence with decreased rectal tone, likely due to prior pelvic surgeries, vaginal deliveries, and possible scar tissue. Symptoms well-managed without acute gastrointestinal pathology or bleeding. - Provided education on pelvic floor dysfunction and symptom management. - Referred to pelvic floor physical therapy. - Recommended fiber supplementation (Benefiber or Citrucel, 1 tablespoon at least twice daily) to improve stool consistency; advised powder is more effective than gummies. - Emphasized consistent fiber intake. - Ordered stool studies for inflammation evaluation with recent ABX, rule out Cdiff. - Ordered abdominal x-ray for pain and bloating evaluation. - Scheduled follow-up in 6-8 weeks to assess intervention response.  Right lower quadrant abdominal pain/swelling with bloating/gas, nausea Right adnexal cyst interval enlargement 2024 Previous saw Dr. Eartha 11/2023 CTAP W trace intrahepatic biliary duct dilatation CBD 2 cm compared to 2.3 previously gradual taper to ampulla, 8 mm hypodensity head of pancreas stable since 2024 no ductal dilatation normal spleen unremarkable bowels, cystic lesion right adnexal space minimally larger 2.9 x 2.2 cm Not associated with bowel movements or food, worse with lying down or turning over - Provided education on pelvic floor dysfunction's role in abdominal pain. - Referred to pelvic floor physical therapy for abdominal wall pain. - Advised monitoring for new or worsening symptoms,  including hematochezia or significant pain change, and to report promptly. - Scheduled follow-up in 6-8 weeks to reassess symptoms and intervention response.  Gastroesophageal reflux disease Chronic heartburn with intermittent dysphagia, managed with over-the-counter antacids. No gastrointestinal bleeding or significant weight loss. - Prescribed nighttime medication for heartburn to reduce reliance on calcium -based antacids. - Provided education on consistent medication use and symptom monitoring. - Scheduled follow-up in 6-8 weeks to assess therapy response.  Personal history of tubular adenomatous/SS polyps first-degree relative mother with colon cancer 08/07/2022 colonoscopy good prep diverticula sigmoid descending ascending colon, 5 polyps 3 to 5 mm in transverse ascending colon, 4 mm polyp descending colon internal hemorrhoids  recall 3 years or 07/2025  Grade 1 diastolic dysfunction 09/25/2021 echo EF 66 5%, grade 1 diastolic unremarkable valves  8 mm hypodensity head of the pancreas 12/01/2023 CTAP W stable since 2024 new since 2022 Likely benign such as cyst or pseudocyst Recall MRI abdomen with and without contrast 11//2026  Morbid obesity  Body mass index is 42.57 kg/m.  -Patient has been advised to make an attempt to improve diet and exercise patterns to aid in weight loss. -Recommended diet heavy in fruits and veggies and low in animal meats, cheeses, and dairy products, appropriate calorie intake   Patient Care Team: Duanne Butler DASEN, MD as PCP - General (Family Medicine) Santo Stanly LABOR, MD as PCP - Cardiology (Cardiology) Nicholaus Sherlean CROME, [facility] (Inactive) as Pharmacist (Pharmacist) Rutherford Gain, MD as Consulting Physician (Obstetrics and Gynecology) Darlis Deatrice RAMAN, MD as Consulting Physician (Pain Medicine)  HISTORY OF PRESENT ILLNESS: 79 y.o. female with a past medical history of arthritis, spinal stenosis, depression, hypertension, hyperlipidemia,  GERD and colon polyps and others listed below presents for evaluation of fecal incontinence and AB pain.   Patient last seen  by Dr. Federico April 2024 for abdominal bloating gas and loose stools.  Discussed the use of AI scribe software for clinical note transcription with the patient, who gave verbal consent to proceed.  History of Present Illness   [name] is a 79 year old female with a history of multiple abdominal surgeries who presents for evaluation of chronic right lower abdominal pain and daily loose stools with fecal incontinence.  Chronic right lower abdominal pain has persisted for a prolonged period, occurring daily and sometimes severe enough to cause nausea and a sensation of swelling or protrusion. Pain is worse in the mornings and improves throughout the day. Lying flat exacerbates discomfort unless she lies on her right side, which provides relief. Pain is not associated with food intake or bowel movements. Previously, pain was severe enough to prevent lying down, requiring her to sleep in a recliner, but this has improved.  Daily episodes of loose stool and fecal incontinence have been present for approximately one year. She is often unaware of stool passage, which can be of small or large volume, and typically wears a pad. Stool is usually loose or watery, with no hard stools. Multiple bowel movements occur per day, often three to four times in the morning, especially after consuming coffee with creamer and sugar. Stools are sometimes formed but often watery, and occasionally dark after taking Pepto Bismol. No blood in the stool, rectal pain, burning, or itching. No oily or shiny stools. Urinary incontinence occurs intermittently, particularly if she waits too long to use the bathroom.  A pelvic cyst was identified on CT scan in November 2025. She previously saw a gynecologist who recommended no intervention due to absence of ovaries. Multiple abdominal surgeries have been  performed, including removal of ovaries and uterus, with significant scar tissue noted. She has three children, all delivered vaginally. Family history is notable for colon cancer in her mother.  Chronic heartburn is managed with daily Tums or antacid chews, sometimes multiple times per day. Pepto Bismol is used occasionally. Intermittent difficulty swallowing is noted. Appetite is generally poor, but there has been no significant weight loss. No fevers, chills, shortness of breath, or chest pain.        She  reports that she has never smoked. She has never used smokeless tobacco. She reports that she does not drink alcohol and does not use drugs.  RELEVANT GI HISTORY, IMAGING AND LABS: Results   Radiology Abdominal CT (11/2023): Persistent cyst, no colonic abnormalities     CTAP 12/07/2023 IMPRESSION: 1. No acute findings in the abdomen or pelvis. 2. 2.9 x 2.2 cm cystic lesion in the right adnexal space measures minimally larger in the interval since the 2024 exam, measuring 2.7 x 2.1 cm on that study. As noted on the prior exam this lesion has shown slow continued growth since 2015. This relatively slow growth over time is suggestive of benign etiology. Given the history of right lower quadrant pain, consider surveillance to ensure stability. 3. 8 mm hypodensity in the head of the pancreas is stable in the interval since 2024 but new since a study from 2022 . This is likely a benign etiology such as a cyst or pseudocyst. Follow-up MRI of the abdomen with and without contrast recommended in 1 year. 4. 4 mm nonobstructing stone lower pole left kidney. 5. Left colonic diverticulosis without diverticulitis. 6.  Aortic Atherosclerosis (ICD10-I70.0).  CTAP 01/23/2021: Lower Chest: No acute findings.   Hepatobiliary: No hepatic masses identified. Prior cholecystectomy.  Diffuse biliary ductal dilatation is again seen, without significant change.   Pancreas: No mass or inflammatory  changes. No evidence of pancreatic ductal dilatation.   Spleen: Within normal limits in size and appearance.   Adrenals/Urinary Tract: No masses identified. A few simple renal cysts are again seen, largest in the left kidney measuring 5 cm. 3 mm calculus is noted in the upper pole of the left kidney. No evidence of ureteral calculi or hydronephrosis.   Stomach/Bowel: No evidence of obstruction, inflammatory process or abnormal fluid collections. Diverticulosis is seen mainly involving the descending and sigmoid colon, however there is no evidence of diverticulitis.   Vascular/Lymphatic: No pathologically enlarged lymph nodes. No acute vascular findings. Aortic atherosclerotic calcification noted.   Reproductive: Previous hysterectomy. A 2.1 cm benign-appearing cyst is again seen in the right adnexa which is stable since previous study.   Other:  None.   Musculoskeletal:  No suspicious bone lesions identified.   IMPRESSION: No evidence of appendicitis or other acute findings.   Stable biliary ductal dilatation, likely due to prior cholecystectomy.   Colonic diverticulosis, without radiographic evidence of diverticulitis.   Tiny nonobstructing left renal calculus.   Stable 2.1 cm benign-appearing right adnexal cyst. No follow-up imaging recommended. Note: This recommendation does not apply to premenarchal patients and to those with increased risk (genetic, family history, elevated tumor markers or other high-risk factors) of ovarian cancer.  Aortic Atherosclerosis    CTAP 08/31/2015: Lower chest: Elevated right hemidiaphragm. Mild right lower lobe atelectasis. No infiltrate or effusion.   Hepatobiliary: Mild fatty infiltration of liver. No focal liver lesion. Gallbladder surgically absent. Extrahepatic bile ducts are dilated. Common hepatic duct measures 18 mm. Common bile duct tapers in the pancreatic section but also appears dilated. Mild intrahepatic biliary  dilatation. Biliary dilatation was also present on the prior study but appears to have progressed somewhat.   Pancreas: Negative for pancreatic mass or edema. No pancreatic calcifications.   Spleen: Negative   Adrenals/Urinary Tract: 4 cm left upper pole cyst is unchanged. No urinary tract calculi. No renal mass or obstruction. Urinary bladder normal.   Stomach/Bowel: Normal stomach and duodenum. Negative for bowel obstruction. Appendix surgically absent.   Sigmoid diverticular change. There is thickening of the sigmoid colon which was not present previously. Minimal stranding in the pericolonic fat in the area. Findings suggest acute or chronic diverticulitis. No abscess.   Vascular/Lymphatic: Normal aorta and IVC.  No adenopathy   Reproductive: Hysterectomy.  Negative for pelvic mass   Other: No free fluid.  No hernia.   Musculoskeletal: Grade 1 anterior slip L4-5 due to disc and facet degeneration. No acute skeletal abnormality.   IMPRESSION: Fatty infiltration liver. Biliary dilatation with mild progression from the prior study. Correlate with liver function tests. This could possibly be secondary to is cholecystectomy.   Sigmoid diverticular change with mucosal edema which was not present on the prior study. Possible diverticulitis which may be subacute. No abscess.    PAST GI PROCEDURES:   Colonoscopy 12/05/2018: - One 3 mm polyp in the descending colon, removed with a cold snare. Resected and retrieved. - Diverticulosis in the left colon. - The examination was otherwise normal on direct and retroflexion views. - COLONIC MUCOSA WITH BENIGN LYMPHOID AGGREGATE. - NO ADENOMATOUS CHANGE OR MALIGNANCY. The quality of the bowel preparation was good.   Colonoscopy 12/19/2013: 1. Two sessile polyps ranging between 3-65mm in size were found in the transverse colon; polypectomies were performed with a cold snare 2. Mild diverticulosis  was noted in the left colon 3. The  examination was otherwise normal - TUBULAR ADENOMA (X 1). - SESSILE SERRATED POLYP WITHOUT CYTOLOGIC DYSPLASIA (X 1). - NO HIGH GRADE DYSPLASIA OR MALIGNANCY IDENTIFIED   Colonoscopy pathology report from 2010 showed one hyperplastic polyp and one tubular adenoma.    Colonoscopy July 2005, Dr. Sheppard Fitz, done for screening.  3 subcentimeter polyps were removed, these were adenomatous on biopsy. Also left-sided diverticulosis was noted CBC    Component Value Date/Time   WBC 7.3 05/21/2023 1621   RBC 4.68 05/21/2023 1621   HGB 13.3 05/21/2023 1621   HGB 13.3 12/27/2019 0954   HCT 40.8 05/21/2023 1621   HCT 38.9 12/27/2019 0954   PLT 231 05/21/2023 1621   PLT 202 12/27/2019 0954   MCV 87.2 05/21/2023 1621   MCV 86 12/27/2019 0954   MCH 28.4 05/21/2023 1621   MCHC 32.6 05/21/2023 1621   RDW 12.7 05/21/2023 1621   RDW 12.0 12/27/2019 0954   LYMPHSABS 1.4 05/07/2022 1130   MONOABS 0.6 05/07/2022 1130   EOSABS 285 05/21/2023 1621   BASOSABS 51 05/21/2023 1621   Recent Labs    05/21/23 1621  HGB 13.3    CMP     Component Value Date/Time   NA 144 02/01/2024 1403   K 4.4 02/01/2024 1403   CL 100 02/01/2024 1403   CO2 25 02/01/2024 1403   GLUCOSE 78 02/01/2024 1403   GLUCOSE 86 06/30/2023 0926   BUN 13 02/01/2024 1403   CREATININE 1.10 (H) 02/01/2024 1403   CREATININE 0.88 06/30/2023 0926   CALCIUM  9.7 02/01/2024 1403   PROT 6.7 06/30/2023 0926   PROT 6.5 07/12/2020 0753   ALBUMIN 4.6 05/07/2022 1130   ALBUMIN 4.5 07/12/2020 0753   AST 14 06/30/2023 0926   ALT 17 06/30/2023 0926   ALKPHOS 43 05/07/2022 1130   BILITOT 0.5 06/30/2023 0926   BILITOT 0.4 07/12/2020 0753   GFRNONAA 54 (L) 01/04/2020 1506   GFRNONAA 68 11/07/2019 1228   GFRAA 62 01/04/2020 1506   GFRAA 79 11/07/2019 1228      Latest Ref Rng & Units 06/30/2023    9:26 AM 05/21/2023    4:21 PM 05/07/2022   11:30 AM  Hepatic Function  Total Protein 6.1 - 8.1 g/dL 6.7  6.4  7.4   Albumin 3.5 - 5.2 g/dL    4.6   AST 10 - 35 U/L 14  15  16    ALT 6 - 29 U/L 17  6  12    Alk Phosphatase 39 - 117 U/L   43   Total Bilirubin 0.2 - 1.2 mg/dL 0.5  0.3  0.5       Current Medications:   Current Outpatient Medications (Endocrine & Metabolic):    JARDIANCE  10 MG TABS tablet, Take 1 tablet (10 mg total) by mouth daily.  Current Outpatient Medications (Cardiovascular):    diltiazem  (CARDIZEM  CD) 360 MG 24 hr capsule, Take 1 capsule by mouth once daily   doxazosin  (CARDURA ) 2 MG tablet, TAKE 1 TABLET BY MOUTH ONCE DAILY. APPOINTMENT REQUIRED FOR FUTURE REFILLS.   furosemide  (LASIX ) 40 MG tablet, TAKE 1 TABLET BY MOUTH ONCE DAILY MAY  TAKE  ADDITIONAL  HALF  TABLET  AS  NEEDED  FOR  SWELLING   lisinopril  (ZESTRIL ) 40 MG tablet, Take 1 tablet (40 mg total) by mouth daily.   rosuvastatin  (CRESTOR ) 10 MG tablet, Take 1 tablet (10 mg total) by mouth daily.  Current Outpatient Medications (Respiratory):  albuterol  (VENTOLIN  HFA) 108 (90 Base) MCG/ACT inhaler, INHALE 2 PUFFS BY MOUTH EVERY 4 HOURS AS NEEDED FOR WHEEZING FOR SHORTNESS OF BREATH   fluticasone  (FLONASE ) 50 MCG/ACT nasal spray, Place 2 sprays into both nostrils daily. (Patient taking differently: Place 2 sprays into both nostrils as needed.)   loratadine (CLARITIN) 10 MG tablet, Take 1 tablet by mouth daily.   loratadine (CLARITIN) 10 MG tablet, Take 10 mg by mouth daily.  Current Outpatient Medications (Analgesics):    acetaminophen  (TYLENOL ) 650 MG CR tablet, Take by mouth as needed for pain.   traMADol  (ULTRAM ) 50 MG tablet, TAKE 1 TABLET BY MOUTH EVERY 8 HOURS AS NEEDED  Current Outpatient Medications (Other):    doxycycline  (VIBRA -TABS) 100 MG tablet, Take 1 tablet (100 mg total) by mouth 2 (two) times daily.   famotidine  (PEPCID ) 40 MG tablet, Take 1 tablet (40 mg total) by mouth at bedtime.   mometasone  (ELOCON ) 0.1 % cream, Apply topically 2 (two) times daily.   Multiple Vitamins-Minerals (CENTRUM MINIS WOMEN 50+) TABS, Take by mouth  daily at 6 (six) AM.   omeprazole  (PRILOSEC) 40 MG capsule, Take 1 capsule (40 mg total) by mouth daily. (Patient taking differently: Take 40 mg by mouth as needed.)   Polyethylene Glycol 3350  (MIRALAX PO), Take by mouth as needed.   potassium chloride  SA (KLOR-CON  M) 20 MEQ tablet, Take 2 tablets (40 mEq total) by mouth daily.   Probiotic Product (PROBIOTIC DAILY PO), Take 1 capsule by mouth daily at 6 (six) AM. [employer] SUPPLEMENT  Medical History:  Past Medical History:  Diagnosis Date   Allergy    Arthritis    arthritis -knees   Colon polyps    benign- last colonoscopy 09-2008   Depression    GERD (gastroesophageal reflux disease)    hx GERD   H/O seasonal allergies    no problems now   Hyperlipidemia    Hypertension    Murmur, heart    Spinal stenosis of lumbar region at multiple levels    Allergies: Allergies[1]   Surgical History:  She  has a past surgical history that includes Abdominal hysterectomy; Cholecystectomy; Appendectomy; Oophorectomy (Bilateral, 2008); Bunionectomy (Bilateral); Excision of adnexal mass (Left, 08/03/2013); Colonoscopy; and Polypectomy. Family History:  Her family history includes Breast cancer in her paternal aunt; Colon cancer (age of onset: 61) in her mother; Colon cancer (age of onset: 49) in her brother; Colon polyps in her brother; Heart attack in her father.  REVIEW OF SYSTEMS  : All other systems reviewed and negative except where noted in the History of Present Illness.  PHYSICAL EXAM: BP 110/82 (BP Location: Left Arm, Patient Position: Sitting, Cuff Size: Normal)   Pulse 76   Ht 5' 4 (1.626 m)   Wt 248 lb (112.5 kg)   SpO2 97%   BMI 42.57 kg/m  Physical Exam   GENERAL APPEARANCE: Well nourished, in no apparent distress. HEENT: No cervical lymphadenopathy, unremarkable thyroid , sclerae anicteric, conjunctiva pink. RESPIRATORY: Respiratory effort normal, breath sounds equal bilaterally without rales, rhonchi, or  wheezing. CARDIO: Irregular rhythm with extra beat, suggestive of bigeminy. Peripheral pulses intact. ABDOMEN: Soft, non-distended, active bowel sounds in all four quadrants, tenderness in the epigastric region and right lower abdomen, no rebound, no mass appreciated. RECTAL: Decreased rectal tone, large amount of soft stool, no masses, stool hemoccult negative. MUSCULOSKELETAL: Full range of motion, normal gait, without edema. SKIN: Dry, intact without rashes or lesions. No jaundice. NEURO: Alert, oriented, no focal deficits. PSYCH: Cooperative,  normal mood and affect.      Alan JONELLE Coombs, PA-C 11:50 AM      [1]  Allergies Allergen Reactions   Aldactone  Abijah.ables ] Rash   "

## 2024-02-17 ENCOUNTER — Encounter: Payer: Self-pay | Admitting: Physician Assistant

## 2024-02-17 ENCOUNTER — Other Ambulatory Visit

## 2024-02-17 ENCOUNTER — Ambulatory Visit (INDEPENDENT_AMBULATORY_CARE_PROVIDER_SITE_OTHER)
Admission: RE | Admit: 2024-02-17 | Discharge: 2024-02-17 | Disposition: A | Discharge status: Home / Self Care | Source: Ambulatory Visit | Attending: Physician Assistant | Admitting: Physician Assistant

## 2024-02-17 ENCOUNTER — Ambulatory Visit: Admitting: Physician Assistant

## 2024-02-17 ENCOUNTER — Ambulatory Visit: Payer: Self-pay | Admitting: Physician Assistant

## 2024-02-17 VITALS — BP 110/82 | HR 76 | Ht 64.0 in | Wt 248.0 lb

## 2024-02-17 DIAGNOSIS — K219 Gastro-esophageal reflux disease without esophagitis: Secondary | ICD-10-CM | POA: Diagnosis not present

## 2024-02-17 DIAGNOSIS — R197 Diarrhea, unspecified: Secondary | ICD-10-CM | POA: Diagnosis not present

## 2024-02-17 DIAGNOSIS — R195 Other fecal abnormalities: Secondary | ICD-10-CM

## 2024-02-17 DIAGNOSIS — Z860101 Personal history of adenomatous and serrated colon polyps: Secondary | ICD-10-CM | POA: Diagnosis not present

## 2024-02-17 DIAGNOSIS — R1031 Right lower quadrant pain: Secondary | ICD-10-CM

## 2024-02-17 DIAGNOSIS — R159 Full incontinence of feces: Secondary | ICD-10-CM | POA: Diagnosis not present

## 2024-02-17 DIAGNOSIS — R140 Abdominal distension (gaseous): Secondary | ICD-10-CM | POA: Diagnosis not present

## 2024-02-17 LAB — CBC WITH DIFFERENTIAL/PLATELET
Basophils Absolute: 0 K/uL (ref 0.0–0.1)
Basophils Relative: 0.6 % (ref 0.0–3.0)
Eosinophils Absolute: 0.1 K/uL (ref 0.0–0.7)
Eosinophils Relative: 2 % (ref 0.0–5.0)
HCT: 42.1 % (ref 36.0–46.0)
Hemoglobin: 14.1 g/dL (ref 12.0–15.0)
Lymphocytes Relative: 20.4 % (ref 12.0–46.0)
Lymphs Abs: 1.3 K/uL (ref 0.7–4.0)
MCHC: 33.4 g/dL (ref 30.0–36.0)
MCV: 87.3 fl (ref 78.0–100.0)
Monocytes Absolute: 0.6 K/uL (ref 0.1–1.0)
Monocytes Relative: 9 % (ref 3.0–12.0)
Neutro Abs: 4.4 K/uL (ref 1.4–7.7)
Neutrophils Relative %: 68 % (ref 43.0–77.0)
Platelets: 214 K/uL (ref 150.0–400.0)
RBC: 4.82 Mil/uL (ref 3.87–5.11)
RDW: 14.5 % (ref 11.5–15.5)
WBC: 6.5 K/uL (ref 4.0–10.5)

## 2024-02-17 LAB — COMPREHENSIVE METABOLIC PANEL WITH GFR
ALT: 12 U/L (ref 3–35)
AST: 14 U/L (ref 5–37)
Albumin: 4.4 g/dL (ref 3.5–5.2)
Alkaline Phosphatase: 38 U/L — ABNORMAL LOW (ref 39–117)
BUN: 11 mg/dL (ref 6–23)
CO2: 31 meq/L (ref 19–32)
Calcium: 9.7 mg/dL (ref 8.4–10.5)
Chloride: 102 meq/L (ref 96–112)
Creatinine, Ser: 0.97 mg/dL (ref 0.40–1.20)
GFR: 56.69 mL/min — ABNORMAL LOW
Glucose, Bld: 88 mg/dL (ref 70–99)
Potassium: 3.9 meq/L (ref 3.5–5.1)
Sodium: 141 meq/L (ref 135–145)
Total Bilirubin: 0.5 mg/dL (ref 0.2–1.2)
Total Protein: 7.1 g/dL (ref 6.0–8.3)

## 2024-02-17 LAB — C-REACTIVE PROTEIN: CRP: <0.5 mg/dL — ABNORMAL LOW (ref 1.0–20.0)

## 2024-02-17 LAB — SEDIMENTATION RATE: Sed Rate: 13 mm/h (ref 0–30)

## 2024-02-17 MED ORDER — FAMOTIDINE 40 MG PO TABS: 40.0000 mg | ORAL_TABLET | Freq: Every day | ORAL | 0 refills | Status: AC

## 2024-02-17 NOTE — Patient Instructions (Addendum)
 Your provider has requested that you have an abdominal x ray before leaving today. Please go to the basement floor to our Radiology department for the test.  VISIT SUMMARY:  During your visit, we discussed your chronic right lower abdominal pain, daily loose stools with fecal incontinence, and chronic heartburn. We have developed a plan to manage these symptoms and improve your quality of life.  YOUR PLAN:  PELVIC FLOOR DYSFUNCTION WITH FECAL INCONTINENCE: You have chronic fecal incontinence likely due to previous pelvic surgeries, vaginal deliveries, and possible scar tissue. -We discussed pelvic floor dysfunction and how to manage your symptoms. -You are referred to pelvic floor physical therapy. -Start taking fiber supplements like Benefiber or Citrucel, 1 tablespoon at least twice daily. The powder form is more effective than gummies. -Maintain a consistent fiber intake. -We ordered stool studies to check for inflammation. -Follow up in 6-8 weeks to see how you are responding to the interventions.  CHRONIC RIGHT LOWER ABDOMINAL PAIN AND BLOATING: Your chronic right lower quadrant pain and bloating may be due to pelvic floor dysfunction, scar tissue, or abdominal wall involvement. -We ordered an abdominal x-ray to evaluate your pain and bloating. -We ordered lab tests to check for inflammation. -We discussed how pelvic floor dysfunction can contribute to abdominal pain. -You are referred to pelvic floor physical therapy for abdominal wall pain. -Monitor for any new or worsening symptoms, such as blood in your stool or significant changes in pain, and report them promptly. -Follow up in 6-8 weeks to reassess your symptoms and the effectiveness of the interventions.  GASTROESOPHAGEAL REFLUX DISEASE: You have chronic heartburn with occasional difficulty swallowing, managed with over-the-counter antacids. -We prescribed a nighttime medication for heartburn to reduce your reliance on  calcium -based antacids. -Use the medication consistently and monitor your symptoms. -Follow up in 6-8 weeks to assess how well the therapy is working.  Here some information about pelvic floor dysfunction. This may be contributing to some of your symptoms. We could also refer to pelvic floor physical therapy.   Pelvic Floor Dysfunction, Female Pelvic floor dysfunction (PFD) is a condition that results when the group of muscles and connective tissues that support the organs in the pelvis (pelvic floor muscles) do not work well. These muscles and their connections form a sling that supports the colon and bladder. In women, they also support the uterus. PFD causes pelvic floor muscles to be too weak, too tight, or both. In PFD, muscle movements are not coordinated. This may cause bowel or bladder problems. It may also cause pain. What are the causes? This condition may be caused by an injury to the pelvic area or by a weakening of pelvic muscles. This often results from pregnancy and childbirth or other types of strain. In many cases, the exact cause is not known. What increases the risk? The following factors may make you more likely to develop this condition: Having chronic bladder tissue inflammation (interstitial cystitis). Being an older person. Being overweight. History of radiation treatment for cancer in the pelvic region. Previous pelvic surgery, such as removal of the uterus (hysterectomy). What are the signs or symptoms? Symptoms of this condition vary and may include: Bladder symptoms, such as: Trouble starting urination and emptying the bladder. Frequent urinary tract infections. Leaking urine when coughing, laughing, or exercising (stress incontinence). Having to pass urine urgently or frequently. Pain when passing urine. Bowel symptoms, such as: Constipation. Urgent or frequent bowel movements. Incomplete bowel movements. Painful bowel movements. Leaking stool or  gas.  Unexplained genital or rectal pain. Genital or rectal muscle spasms. Low back pain. Other symptoms may include: A heavy, full, or aching feeling in the vagina. A bulge that protrudes into the vagina. Pain during or after sex. How is this diagnosed? This condition may be diagnosed based on: Your symptoms and medical history. A physical exam. During the exam, your health care provider may check your pelvic muscles for tightness, spasm, pain, or weakness. This may include a rectal exam and a pelvic exam. In some cases, you may have diagnostic tests, such as: Electrical muscle function tests. Urine flow testing. X-ray tests of bowel function. Ultrasound of the pelvic organs. How is this treated? Treatment for this condition depends on the symptoms. Treatment options include: Physical therapy. This may include Kegel exercises to help relax or strengthen the pelvic floor muscles. Biofeedback. This type of therapy provides feedback on how tight your pelvic floor muscles are so that you can learn to control them. Internal or external massage therapy. A treatment that involves electrical stimulation of the pelvic floor muscles to help control pain (transcutaneous electrical nerve stimulation, or TENS). Sound wave therapy (ultrasound) to reduce muscle spasms. Medicines, such as: Muscle relaxants. Bladder control medicines. Surgery to reconstruct or support pelvic floor muscles may be an option if other treatments do not help. Follow these instructions at home: Activity Do your usual activities as told by your health care provider. Ask your health care provider if you should modify any activities. Do pelvic floor strengthening or relaxing exercises at home as told by your physical therapist. Lifestyle Maintain a healthy weight. Eat foods that are high in fiber, such as beans, whole grains, and fresh fruits and vegetables. Limit foods that are high in fat and processed sugars, such as  fried or sweet foods. Manage stress with relaxation techniques such as yoga or meditation. General instructions If you have problems with leakage: Use absorbable pads or wear padded underwear. Wash frequently with mild soap. Keep your genital and anal area as clean and dry as possible. Ask your health care provider if you should try a barrier cream to prevent skin irritation. Take warm baths to relieve pelvic muscle tension or spasms. Take over-the-counter and prescription medicines only as told by your health care provider. Keep all follow-up visits. How is this prevented? The cause of PFD is not always known, but there are a few things you can do to reduce the risk of developing this condition, including: Staying at a healthy weight. Getting regular exercise. Managing stress. Contact a health care provider if: Your symptoms are not improving with home care. You have signs or symptoms of PFD that get worse at home. You develop new signs or symptoms. You have signs of a urinary tract infection, such as: Fever. Chills. Increased urinary frequency. A burning feeling when urinating. You have not had a bowel movement in 3 days (constipation). Summary Pelvic floor dysfunction results when the muscles and connective tissues in your pelvic floor do not work well. These muscles and their connections form a sling that supports your colon and bladder. In women, they also support the uterus. PFD may be caused by an injury to the pelvic area or by a weakening of pelvic muscles. PFD causes pelvic floor muscles to be too weak, too tight, or a combination of both. Symptoms may vary from person to person. In most cases, PFD can be treated with physical therapies and medicines. Surgery may be an option if other treatments do not help.  This information is not intended to replace advice given to you by your health care provider. Make sure you discuss any questions you have with your health care  provider. Document Revised: 05/23/2020 Document Reviewed: 05/23/2020 [employer] Patient Education  2022 [employer].  Due to recent changes in healthcare laws, you may see the results of your imaging and laboratory studies on MyChart before your provider has had a chance to review them.  We understand that in some cases there may be results that are confusing or concerning to you. Not all laboratory results come back in the same time frame and the provider may be waiting for multiple results in order to interpret others.  Please give us  48 hours in order for your provider to thoroughly review all the results before contacting the office for clarification of your results.

## 2024-02-18 ENCOUNTER — Other Ambulatory Visit: Payer: Self-pay | Admitting: Family Medicine

## 2024-02-18 DIAGNOSIS — I10 Essential (primary) hypertension: Secondary | ICD-10-CM

## 2024-02-18 DIAGNOSIS — I5032 Chronic diastolic (congestive) heart failure: Secondary | ICD-10-CM

## 2024-02-25 ENCOUNTER — Other Ambulatory Visit: Payer: Self-pay | Admitting: Family Medicine

## 2024-02-25 DIAGNOSIS — I10 Essential (primary) hypertension: Secondary | ICD-10-CM

## 2024-03-23 ENCOUNTER — Encounter: Admitting: Dermatology

## 2024-04-05 ENCOUNTER — Encounter: Admitting: Family Medicine

## 2024-04-05 ENCOUNTER — Ambulatory Visit: Admitting: Cardiology

## 2024-04-18 ENCOUNTER — Ambulatory Visit: Admitting: Physician Assistant

## 2024-05-19 ENCOUNTER — Encounter: Admitting: Physical Therapy

## 2024-06-30 ENCOUNTER — Encounter
